# Patient Record
Sex: Male | Born: 1955 | Race: White | Hispanic: No | Marital: Married | Smoking: Never smoker
Health system: Southern US, Community
[De-identification: ages and names within clinical notes are randomized; demographics above are authoritative.]

## PROBLEM LIST (undated history)

## (undated) ENCOUNTER — Ambulatory Visit: Disposition: A | Discharge status: Home / Self Care | Payer: BC Managed Care – PPO

## (undated) DIAGNOSIS — R519 Headache, unspecified: Secondary | ICD-10-CM

## (undated) DIAGNOSIS — H532 Diplopia: Secondary | ICD-10-CM

## (undated) DIAGNOSIS — E119 Type 2 diabetes mellitus without complications: Secondary | ICD-10-CM

## (undated) HISTORY — DX: Diplopia: H53.2

## (undated) HISTORY — DX: Headache, unspecified: R51.9

## (undated) HISTORY — PX: BUNIONECTOMY: SHX129

## (undated) HISTORY — DX: Type 2 diabetes mellitus without complications: E11.9

---

## 2012-10-17 ENCOUNTER — Encounter: Payer: Self-pay | Admitting: Orthopedic Surgery

## 2012-10-17 ENCOUNTER — Ambulatory Visit (INDEPENDENT_AMBULATORY_CARE_PROVIDER_SITE_OTHER): Payer: BC Managed Care – PPO | Admitting: Orthopedic Surgery

## 2012-10-17 ENCOUNTER — Other Ambulatory Visit: Payer: Self-pay | Admitting: *Deleted

## 2012-10-17 ENCOUNTER — Ambulatory Visit (INDEPENDENT_AMBULATORY_CARE_PROVIDER_SITE_OTHER): Payer: BC Managed Care – PPO

## 2012-10-17 DIAGNOSIS — M79604 Pain in right leg: Secondary | ICD-10-CM

## 2012-10-17 DIAGNOSIS — S93491A Sprain of other ligament of right ankle, initial encounter: Secondary | ICD-10-CM

## 2012-10-17 DIAGNOSIS — M79609 Pain in unspecified limb: Secondary | ICD-10-CM

## 2012-10-17 DIAGNOSIS — M93279 Osteochondritis dissecans, unspecified ankle and joints of foot: Secondary | ICD-10-CM | POA: Insufficient documentation

## 2012-10-17 DIAGNOSIS — S93439A Sprain of tibiofibular ligament of unspecified ankle, initial encounter: Secondary | ICD-10-CM

## 2012-10-17 DIAGNOSIS — M9320 Osteochondritis dissecans of unspecified site: Secondary | ICD-10-CM

## 2012-10-17 DIAGNOSIS — M79606 Pain in leg, unspecified: Secondary | ICD-10-CM | POA: Insufficient documentation

## 2012-10-17 MED ORDER — HYDROCODONE-ACETAMINOPHEN 7.5-325 MG PO TABS: 1.0000 | ORAL_TABLET | ORAL | Status: DC | PRN

## 2012-10-17 MED ORDER — IBUPROFEN 800 MG PO TABS: 800.0000 mg | ORAL_TABLET | Freq: Three times a day (TID) | ORAL | Status: DC | PRN

## 2012-10-17 NOTE — Patient Instructions (Addendum)
MRI  Right ankle   Please call the hospital to START PHYSICAL THERAPY (choice)  Weight bearing - no weight bearing until mri results obtained

## 2012-10-17 NOTE — Progress Notes (Signed)
Patient ID: Javier Graham, male   DOB: 02/28/1956, 57 y.o.   MRN: [id number] Chief Complaint  Patient presents with  . Ankle Pain    Right ankle sprain Grade II d/t injury 10/04/12. Referred by Dr. 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 He was able to walk ambulate perform all his activities of daily living without any discomfort  General appearance is normal, the patient is alert and oriented x3 with normal mood and affect. BP 150/88  Ht 6\' 1"  (1.854 m)  Wt 230 lb (104.327 kg)  BMI 30.35 kg/m2 Ambulation is with a set of  Crutches and Cam Walker and has minimal weightbearing at this point he hasn't been able to weight-bear after the injury Right ankle is swollen he is tender over the right fibula and also in the syndesmosis ligaments proximally halfway up the ankle. He has limited range of motion. His ankle has 1+ drawer with a firm endpoint and a sulcus sign laterally. Muscle tone is normal strength cannot be assessed skin is ecchymotic pulses normal temperature is normal swelling is noted sensation is intact and normal  Upper  extremity exam  The right and left upper extremity:   Inspection revealed no abnormalities in the upper extremities.   Range of motion is full without contracture.  Motor exam is normal with grade 5 strength.  The joints are fully reduced without subluxation.  There is no atrophy or tremor and muscle tone is normal.  All joints are stable.    A repeat tib-fib x-rays showed no fracture the fibula  Plain films of the ankle were normal except for some scattered bone fragments  CT scan as described above  Recommend MRI right ankle to determine the stage of the osteochondral lesion of the talar dome and therefore determine treatment surgical versus nonoperative  Continue nonweightbearing take pain medication as ordered start ankle range of motion exercises with physical therapy followup after MRI

## 2012-10-22 ENCOUNTER — Other Ambulatory Visit: Payer: Self-pay | Admitting: Radiology

## 2012-10-23 ENCOUNTER — Ambulatory Visit
Admission: RE | Admit: 2012-10-23 | Discharge: 2012-10-23 | Disposition: A | Discharge status: Home / Self Care | Payer: BC Managed Care – PPO | Source: Ambulatory Visit | Attending: Orthopedic Surgery | Admitting: Orthopedic Surgery

## 2012-10-23 DIAGNOSIS — M79604 Pain in right leg: Secondary | ICD-10-CM

## 2012-10-24 ENCOUNTER — Telehealth: Payer: Self-pay | Admitting: Orthopedic Surgery

## 2012-10-24 NOTE — Telephone Encounter (Signed)
Patient called regarding the MRI of ankle, which was done yesterday, 10/23/12, at [facility].  He stated he was to call back for appointment, which I scheduled, then saw that he is already scheduled for MRI review appointment Monday, 10/29/12.  His concern is whether the MRI indicates "old injury" or "new injury" which he said was a determining factor for surgery.  Please advise. Patient's cell ph# is [phone],

## 2012-10-27 ENCOUNTER — Encounter: Payer: Self-pay | Admitting: Orthopedic Surgery

## 2012-10-27 NOTE — Progress Notes (Unsigned)
Patient ID: Javier Graham, male   DOB: 05-24-1955, 57 y.o.   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

## 2012-10-29 ENCOUNTER — Ambulatory Visit: Payer: BC Managed Care – PPO | Admitting: Orthopedic Surgery

## 2012-10-30 ENCOUNTER — Ambulatory Visit: Payer: BC Managed Care – PPO | Admitting: Orthopedic Surgery

## 2012-11-08 ENCOUNTER — Telehealth: Payer: Self-pay | Admitting: Orthopedic Surgery

## 2012-11-08 NOTE — Telephone Encounter (Signed)
Yes come in here for aso  No appointmnet slot needed ok to double book if needed

## 2012-11-08 NOTE — Telephone Encounter (Signed)
Wellington [place] asking if he can get a brace for his ankle. His # [phone]

## 2012-11-08 NOTE — Telephone Encounter (Signed)
Spoke with patient he is coming in 2:30 11/13/12

## 2012-11-13 ENCOUNTER — Telehealth: Payer: Self-pay | Admitting: *Deleted

## 2012-11-13 ENCOUNTER — Ambulatory Visit (INDEPENDENT_AMBULATORY_CARE_PROVIDER_SITE_OTHER): Payer: BC Managed Care – PPO | Admitting: Orthopedic Surgery

## 2012-11-13 DIAGNOSIS — Z5189 Encounter for other specified aftercare: Secondary | ICD-10-CM

## 2012-11-13 DIAGNOSIS — S93491D Sprain of other ligament of right ankle, subsequent encounter: Secondary | ICD-10-CM

## 2012-11-13 NOTE — Progress Notes (Signed)
Patient ID: Javier Graham, male   DOB: June 20, 1955, 57 y.o.   MRN: [id number] The patient came in today to get a brace which was an ASO brace he was seen by the nurse.

## 2012-11-13 NOTE — Telephone Encounter (Signed)
Patient came in to be fitted for ASO brace. He was placed in a large ASO brace.

## 2012-12-11 ENCOUNTER — Ambulatory Visit (INDEPENDENT_AMBULATORY_CARE_PROVIDER_SITE_OTHER): Payer: BC Managed Care – PPO | Admitting: Orthopedic Surgery

## 2012-12-11 DIAGNOSIS — M9320 Osteochondritis dissecans of unspecified site: Secondary | ICD-10-CM

## 2012-12-11 NOTE — Progress Notes (Signed)
Patient ID: Javier Graham, male   DOB: 1956-04-21, 57 y.o.   MRN: [id number] Chief Complaint  Patient presents with  . Follow-up    right ankle follow up following PT    BP 132/86  Ht 6\' 1"  (1.854 m)  Wt 230 lb (104.327 kg)  BMI 30.35 kg/m2  Status post right ankle injury. History of previous problems with his ankle. MRI and CT scan used to evaluate osteochondral lesion. Patient did well with therapy. No complaints. Review of systems negative. Vital signs as above. Exam shows some swelling which [place] ambulation normal no tenderness decreased range of motion is noted especially with plantarflexion skin is intact he has a little sunburn good pulse General appearance is normal, the patient is alert and oriented x3 with normal mood and affect.   Osteochondral lesion Right ankle pain Right ankle sprain  Activities as tolerated followup as needed

## 2016-06-17 ENCOUNTER — Encounter: Payer: Self-pay | Admitting: Endocrinology

## 2016-06-17 ENCOUNTER — Ambulatory Visit (INDEPENDENT_AMBULATORY_CARE_PROVIDER_SITE_OTHER): Payer: BLUE CROSS/BLUE SHIELD | Admitting: Endocrinology

## 2016-06-17 DIAGNOSIS — E1142 Type 2 diabetes mellitus with diabetic polyneuropathy: Secondary | ICD-10-CM | POA: Diagnosis not present

## 2016-06-17 MED ORDER — LIRAGLUTIDE 18 MG/3ML ~~LOC~~ SOPN: 1.8000 mg | PEN_INJECTOR | Freq: Every day | SUBCUTANEOUS | 3 refills | Status: DC

## 2016-06-17 MED ORDER — GLIMEPIRIDE 4 MG PO TABS: 4.0000 mg | ORAL_TABLET | Freq: Every day | ORAL | 3 refills | Status: DC

## 2016-06-17 NOTE — Patient Instructions (Addendum)
good diet and exercise significantly improve the control of your diabetes.  please let me know if you wish to be referred to a dietician.  high blood sugar is very risky to your health.  you should see an eye doctor and dentist every year.  It is very important to get all recommended vaccinations.  Controlling your blood pressure and cholesterol drastically reduces the damage diabetes does to your body.  Those who smoke should quit.  Please discuss these with your doctor.  check your blood sugar once a day.  vary the time of day when you check, between before the 3 meals, and at bedtime.  also check if you have symptoms of your blood sugar being too high or too low.  please keep a record of the readings and bring it to your next appointment here (or you can bring the meter itself).  You can write it on any piece of paper.  please call us sooner if your blood sugar goes below 70, or if you have a lot of readings over 200.  For now, please: Please continue the same metformin, and: Increase the victoza to 1.8 mg daily, and: I have sent a prescription to your pharmacy, to add glimepiride, and:  Please call us next week, to tell us how the blood sugar is doing.  If necessary, we can add "farxiga."   There is a good chance you would need insulin, which we can do if necessary.  You can take this once a day if you want, but 3 times a day (just before each meal) is better.   Please come back for a follow-up appointment in 3 months.

## 2016-06-17 NOTE — Progress Notes (Signed)
Subjective:    Patient ID: Javier Graham, male    DOB: Sep 28, 1955, 61 y.o.   MRN: [id number]  HPI pt is referred by Roma Kayser, NP, for diabetes.  Pt states DM was dx'ed in 2002; he has mild neuropathy of the lower extremities; he is unaware of any associated chronic complications; he has never been on insulin; pt says his diet and exercise are fair; he has never had pancreatitis, severe hypoglycemia or DKA.   Past Medical History:  Diagnosis Date  . Diabetes [facility])     Past Surgical History:  Procedure Laterality Date  . BUNIONECTOMY      Social History   Social History  . Marital status: Married    Spouse name: N/A  . Number of children: N/A  . Years of education: N/A   Occupational History  . Not on file.   Social History Main Topics  . Smoking status: Never Smoker  . Smokeless tobacco: Never Used  . Alcohol use No  . Drug use: No  . Sexual activity: Not on file   Other Topics Concern  . Not on file   Social History Narrative  . No narrative on file    Current Outpatient Prescriptions on File Prior to Visit  Medication Sig Dispense Refill  . metFORMIN (GLUMETZA) 500 MG (MOD) 24 hr tablet Take 500 mg by mouth. Take 2 tabs twice daily     No current facility-administered medications on file prior to visit.     No Known Allergies  Family History  Problem Relation Age of Onset  . Heart disease    . Diabetes    . Diabetes Father   . Diabetes Sister   . Diabetes Brother     BP 140/84   Pulse 98   Ht 6\' 1"  (1.854 m)   Wt 228 lb (103.4 kg)   SpO2 97%   BMI 30.08 kg/m    Review of Systems denies blurry vision, headache, chest pain, sob, n/v, urinary frequency, muscle cramps, excessive diaphoresis, depression, cold intolerance, rhinorrhea, and easy bruising.  He has gained weight.     Objective:   Physical Exam VS: see vs page GEN: no distress HEAD: head: no deformity eyes: no periorbital swelling, no proptosis external nose and ears are  normal mouth: no lesion seen NECK: supple, thyroid is not enlarged.   CHEST WALL: no deformity LUNGS: clear to auscultation CV: reg rate and rhythm, no murmur ABD: abdomen is soft, nontender.  no hepatosplenomegaly.  not distended.  no hernia MUSCULOSKELETAL: muscle bulk and strength are grossly normal.  no obvious joint swelling.  gait is normal and steady EXTEMITIES: no deformity.  no ulcer on the feet.  feet are of normal color and temp.  no edema PULSES: dorsalis pedis intact bilat.  no carotid bruit NEURO:  cn 2-12 grossly intact.   readily moves all 4's.  sensation is intact to touch on the feet SKIN:  Normal texture and temperature.  No rash or suspicious lesion is visible.   NODES:  None palpable at the neck PSYCH: alert, well-oriented.  Does not appear anxious nor depressed.    outside test results are reviewed:  A1c=9.8%  I have reviewed outside records, and summarized: Pt was noted to have elevated a1c, and referred here.  DM was noted to be poorly-controlled, but stable     Assessment & Plan:  Type 2 DM with polyneuropathy: he needs increased rx HTN: farxiga will help slightly.    Patient is  advised the following: Patient Instructions  good diet and exercise significantly improve the control of your diabetes.  please let me know if you wish to be referred to a dietician.  high blood sugar is very risky to your health.  you should see an eye doctor and dentist every year.  It is very important to get all recommended vaccinations.  Controlling your blood pressure and cholesterol drastically reduces the damage diabetes does to your body.  Those who smoke should quit.  Please discuss these with your doctor.  check your blood sugar once a day.  vary the time of day when you check, between before the 3 meals, and at bedtime.  also check if you have symptoms of your blood sugar being too high or too low.  please keep a record of the readings and bring it to your next appointment  here (or you can bring the meter itself).  You can write it on any piece of paper.  please call us sooner if your blood sugar goes below 70, or if you have a lot of readings over 200.  For now, please: Please continue the same metformin, and: Increase the victoza to 1.8 mg daily, and: I have sent a prescription to your pharmacy, to add glimepiride, and:  Please call us next week, to tell us how the blood sugar is doing.  If necessary, we can add "farxiga."   There is a good chance you would need insulin, which we can do if necessary.  You can take this once a day if you want, but 3 times a day (just before each meal) is better.   Please come back for a follow-up appointment in 3 months.

## 2016-06-19 ENCOUNTER — Encounter: Payer: Self-pay | Admitting: Endocrinology

## 2016-06-19 DIAGNOSIS — E119 Type 2 diabetes mellitus without complications: Secondary | ICD-10-CM | POA: Insufficient documentation

## 2016-06-20 ENCOUNTER — Other Ambulatory Visit: Payer: Self-pay | Admitting: Endocrinology

## 2016-06-20 MED ORDER — DAPAGLIFLOZIN PROPANEDIOL 5 MG PO TABS: 5.0000 mg | ORAL_TABLET | Freq: Every day | ORAL | 11 refills | Status: DC

## 2016-07-08 ENCOUNTER — Ambulatory Visit: Payer: Self-pay | Admitting: "Endocrinology

## 2016-08-26 ENCOUNTER — Encounter: Payer: Self-pay | Admitting: Endocrinology

## 2016-09-14 ENCOUNTER — Ambulatory Visit: Payer: BLUE CROSS/BLUE SHIELD | Admitting: Endocrinology

## 2017-01-16 ENCOUNTER — Other Ambulatory Visit: Payer: Self-pay | Admitting: Endocrinology

## 2017-01-16 NOTE — Telephone Encounter (Signed)
Please refill x 1 Ov is due  

## 2018-11-05 ENCOUNTER — Ambulatory Visit (HOSPITAL_COMMUNITY)
Admission: RE | Admit: 2018-11-05 | Discharge: 2018-11-05 | Disposition: A | Discharge status: Home / Self Care | Payer: BC Managed Care – PPO | Source: Ambulatory Visit | Attending: Physician Assistant | Admitting: Physician Assistant

## 2018-11-05 ENCOUNTER — Other Ambulatory Visit (HOSPITAL_COMMUNITY): Payer: Self-pay | Admitting: Physician Assistant

## 2018-11-05 ENCOUNTER — Other Ambulatory Visit: Payer: Self-pay

## 2018-11-05 DIAGNOSIS — I10 Essential (primary) hypertension: Secondary | ICD-10-CM | POA: Diagnosis present

## 2018-11-05 DIAGNOSIS — I639 Cerebral infarction, unspecified: Secondary | ICD-10-CM

## 2018-11-05 DIAGNOSIS — Z01818 Encounter for other preprocedural examination: Secondary | ICD-10-CM

## 2018-11-06 ENCOUNTER — Ambulatory Visit (HOSPITAL_BASED_OUTPATIENT_CLINIC_OR_DEPARTMENT_OTHER)
Admission: RE | Admit: 2018-11-06 | Discharge: 2018-11-06 | Disposition: A | Discharge status: Home / Self Care | Payer: BC Managed Care – PPO | Source: Ambulatory Visit | Attending: Physician Assistant | Admitting: Physician Assistant

## 2018-11-06 ENCOUNTER — Ambulatory Visit (HOSPITAL_COMMUNITY)
Admission: RE | Admit: 2018-11-06 | Discharge: 2018-11-06 | Disposition: A | Discharge status: Home / Self Care | Payer: BC Managed Care – PPO | Source: Ambulatory Visit | Attending: Physician Assistant | Admitting: Physician Assistant

## 2018-11-06 DIAGNOSIS — H532 Diplopia: Secondary | ICD-10-CM | POA: Diagnosis not present

## 2018-11-06 DIAGNOSIS — I639 Cerebral infarction, unspecified: Secondary | ICD-10-CM | POA: Insufficient documentation

## 2018-11-06 DIAGNOSIS — R51 Headache: Secondary | ICD-10-CM | POA: Diagnosis not present

## 2018-11-06 DIAGNOSIS — Z01818 Encounter for other preprocedural examination: Secondary | ICD-10-CM

## 2018-11-06 NOTE — Progress Notes (Signed)
Echocardiogram 2D Echocardiogram has been performed.  Javier Graham 11/06/2018, 3:53 PM

## 2018-12-19 ENCOUNTER — Other Ambulatory Visit: Payer: Self-pay

## 2018-12-19 ENCOUNTER — Encounter: Payer: Self-pay | Admitting: Neurology

## 2018-12-19 ENCOUNTER — Ambulatory Visit: Payer: BC Managed Care – PPO | Admitting: Neurology

## 2018-12-19 VITALS — BP 132/83 | HR 86 | Temp 97.7°F | Ht 73.0 in | Wt 231.5 lb

## 2018-12-19 DIAGNOSIS — H532 Diplopia: Secondary | ICD-10-CM | POA: Diagnosis not present

## 2018-12-19 DIAGNOSIS — I6501 Occlusion and stenosis of right vertebral artery: Secondary | ICD-10-CM

## 2018-12-19 MED ORDER — CLOPIDOGREL BISULFATE 75 MG PO TABS: 75.0000 mg | ORAL_TABLET | Freq: Every day | ORAL | 11 refills | Status: DC

## 2018-12-19 NOTE — Progress Notes (Addendum)
PATIENT: Javier KatayamaGary L Graham DOB: 1955-11-19  Chief Complaint  Patient presents with  . Headache/Diplopia    He is here with his wife, Javier Graham.  He has head pains daily that vary in severity.  The pain may only last 30 seconds to 5 minutes and at worst, he rates the pain at 3.  His constant double vision is more of an issue.    Marland Kitchen. Neurosurgery    Shirlean KellyNudelman, Robert, MD (referring provider)  . PCP    Burdine, Ananias PilgrimSteven E, MD     HISTORICAL  Javier Graham is a 63 year old male, seen in request by neurosurgeon Dr. Shirlean Kellyobert Nudelman for evaluation of headaches, persistent diplopia, is accompanied by his wife at today's clinic visit on December 19, 2018.  I have reviewed and summarized the referring note from the referring physician.  He has past medical history of hypertension, diabetes,  On October 28, 2018, he woke up noticed double vision, which has been persistent since then, most noticeable when he look to the right/inferior visual field, barely noticeable if he look to the left side, over the past couple months, his double vision has slight improvement, he described as persistent vertical double vision,  He denies fatigable ptosis, no dysarthria, no chewing difficulty, no limb muscle weakness, no shortness of breath  Ultrasound of carotid artery on November 06, 2018, less than 39% stenosis of bilateral internal carotid artery, left vertebral artery demonstrate antegrade flow, right vertebral artery demonstrate high resistant flow  Echocardiogram ejection fraction 60 to 65%, normal cavity, impaired relaxation, no evidence of left ventricular regional wall motion abnormalities.  REVIEW OF SYSTEMS: Full 14 system review of systems performed and notable only for as above All other review of systems were negative.  ALLERGIES: No Known Allergies  HOME MEDICATIONS: Current Outpatient Medications  Medication Sig Dispense Refill  . aspirin EC 81 MG tablet Take 81 mg by mouth daily.    . dapagliflozin  propanediol (FARXIGA) 5 MG TABS tablet Take 5 mg by mouth daily. 30 tablet 11  . glimepiride (AMARYL) 4 MG tablet Take 1 tablet (4 mg total) by mouth daily before breakfast. 30 tablet 3  . lisinopril (ZESTRIL) 5 MG tablet Take 5 mg by mouth daily.    . Melatonin 5 MG TABS Take 1 tablet by mouth as needed.    . metFORMIN (GLUMETZA) 500 MG (MOD) 24 hr tablet Take 500 mg by mouth. Take 2 tabs twice daily    . VICTOZA 18 MG/3ML SOPN INJECT 0.3 ML'S (1.8 MG TOTAL) INTO THE SKIN DAILY 9 pen 3   No current facility-administered medications for this visit.     PAST MEDICAL HISTORY: Past Medical History:  Diagnosis Date  . Diabetes (HCC)   . Diplopia   . Headache     PAST SURGICAL HISTORY: Past Surgical History:  Procedure Laterality Date  . BUNIONECTOMY      FAMILY HISTORY: Family History  Problem Relation Age of Onset  . Heart disease Other   . Diabetes Other   . Diabetes Father   . Stroke Father        x 2  . Heart disease Father   . Diabetes Sister   . Diabetes Brother   . Heart disease Mother     SOCIAL HISTORY: Social History   Socioeconomic History  . Marital status: Married    Spouse name: Not on file  . Number of children: 3  . Years of education: college  . Highest education level:  Master's degree (e.g., MA, MS, MEng, MEd, MSW, MBA)  Occupational History  . Not on file  Social Needs  . Financial resource strain: Not on file  . Food insecurity    Worry: Not on file    Inability: Not on file  . Transportation needs    Medical: Not on file    Non-medical: Not on file  Tobacco Use  . Smoking status: Never Smoker  . Smokeless tobacco: Never Used  Substance and Sexual Activity  . Alcohol use: No    Comment: none since 1996  . Drug use: No  . Sexual activity: Not on file  Lifestyle  . Physical activity    Days per week: Not on file    Minutes per session: Not on file  . Stress: Not on file  Relationships  . Social Musicianconnections    Talks on phone: Not on  file    Gets together: Not on file    Attends religious service: Not on file    Active member of club or organization: Not on file    Attends meetings of clubs or organizations: Not on file    Relationship status: Not on file  . Intimate partner violence    Fear of current or ex partner: Not on file    Emotionally abused: Not on file    Physically abused: Not on file    Forced sexual activity: Not on file  Other Topics Concern  . Not on file  Social History Narrative   Lives at home with his wife.   Right-handed.   One can of Coke Zero per day.     PHYSICAL EXAM   Vitals:   12/19/18 0746  BP: 132/83  Pulse: 86  Temp: 97.7 F (36.5 C)  Weight: 231 lb 8 oz (105 kg)  Height: 6\' 1"  (1.854 m)    Not recorded      Body mass index is 30.54 kg/m.  PHYSICAL EXAMNIATION:  Gen: NAD, conversant, well nourised, obese, well groomed                     Cardiovascular: Regular rate rhythm, no peripheral edema, warm, nontender. Eyes: Conjunctivae clear without exudates or hemorrhage Neck: Supple, no carotid bruits. Pulmonary: Clear to auscultation bilaterally   NEUROLOGICAL EXAM:  MENTAL STATUS: Speech:    Speech is normal; fluent and spontaneous with normal comprehension.  Cognition:     Orientation to time, place and person     Normal recent and remote memory     Normal Attention span and concentration     Normal Language, naming, repeating,spontaneous speech     Fund of knowledge   CRANIAL NERVES: CN II: Visual fields are full to confrontation.  Pupils are round equal and briskly reactive to light. CN III, IV, VI: No ptosis.  Red lens testing suggestive of left superior oblique muscle weakness, CN V: Facial sensation is intact to pinprick in all 3 divisions bilaterally. Corneal responses are intact.  CN VII: Face is symmetric with normal eye closure and smile. CN VIII: Hearing is normal to rubbing fingers CN IX, X: Palate elevates symmetrically. Phonation is normal.  CN XI: Head turning and shoulder shrug are intact CN XII: Tongue is midline with normal movements and no atrophy.  MOTOR: There is no pronator drift of out-stretched arms. Muscle bulk and tone are normal. Muscle strength is normal.  REFLEXES: Reflexes are 2+ and symmetric at the biceps, triceps, knees, and ankles. Plantar responses are  flexor.  SENSORY: Intact to light touch, pinprick, positional sensation and vibratory sensation are intact in fingers and toes.  COORDINATION: Rapid alternating movements and fine finger movements are intact. There is no dysmetria on finger-to-nose and heel-knee-shin.    GAIT/STANCE: Posture is normal. Gait is steady with normal steps, base, arm swing, and turning. Heel and toe walking are normal. Tandem gait is normal.  Romberg is absent.   DIAGNOSTIC DATA (LABS, IMAGING, TESTING) - I reviewed patient records, labs, notes, testing and imaging myself where available.   ASSESSMENT AND PLAN  Javier Graham is a 63 y.o. 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 Jolyn Nap  Continue aspirin 81 mg daily  Increase water intake   Marcial Pacas, M.D. Ph.D.  [facility] [address] [place], [place] [address] Ph: [phone] Fax: [phone]  CC: Curlene Labrum, MD  Was seen by Dr. Jolyn Nap, confirm left trochlear palsy.

## 2018-12-20 ENCOUNTER — Telehealth: Payer: Self-pay | Admitting: Neurology

## 2018-12-20 NOTE — Telephone Encounter (Signed)
[employer] Auth: [id number] (exp. 12/20/18 to 06/17/19) order sent to GI. They will reach out to the patient to schedule.

## 2018-12-21 LAB — C-REACTIVE PROTEIN: CRP: <1 mg/L (ref 0–10)

## 2018-12-21 LAB — COMPREHENSIVE METABOLIC PANEL
ALT: 27 IU/L (ref 0–44)
AST: 22 IU/L (ref 0–40)
Albumin/Globulin Ratio: 2.1 (ref 1.2–2.2)
Albumin: 4.6 g/dL (ref 3.8–4.8)
Alkaline Phosphatase: 87 IU/L (ref 39–117)
BUN/Creatinine Ratio: 14 (ref 10–24)
BUN: 14 mg/dL (ref 8–27)
Bilirubin Total: 0.4 mg/dL (ref 0.0–1.2)
CO2: 23 mmol/L (ref 20–29)
Calcium: 9.9 mg/dL (ref 8.6–10.2)
Chloride: 101 mmol/L (ref 96–106)
Creatinine, Ser: 1.03 mg/dL (ref 0.76–1.27)
GFR calc Af Amer: 90 mL/min/{1.73_m2} (ref >59)
GFR calc non Af Amer: 77 mL/min/{1.73_m2} (ref >59)
Globulin, Total: 2.2 g/dL (ref 1.5–4.5)
Glucose: 208 mg/dL — ABNORMAL HIGH (ref 65–99)
Potassium: 5 mmol/L (ref 3.5–5.2)
Sodium: 139 mmol/L (ref 134–144)
Total Protein: 6.8 g/dL (ref 6.0–8.5)

## 2018-12-21 LAB — CBC WITH DIFFERENTIAL/PLATELET
Basophils Absolute: 0 10*3/uL (ref 0.0–0.2)
Basos: 1 %
EOS (ABSOLUTE): 0.3 10*3/uL (ref 0.0–0.4)
Eos: 4 %
Hematocrit: 44 % (ref 37.5–51.0)
Hemoglobin: 15.2 g/dL (ref 13.0–17.7)
Immature Grans (Abs): 0 10*3/uL (ref 0.0–0.1)
Immature Granulocytes: 0 %
Lymphocytes Absolute: 1.6 10*3/uL (ref 0.7–3.1)
Lymphs: 24 %
MCH: 29.2 pg (ref 26.6–33.0)
MCHC: 34.5 g/dL (ref 31.5–35.7)
MCV: 85 fL (ref 79–97)
Monocytes Absolute: 0.5 10*3/uL (ref 0.1–0.9)
Monocytes: 7 %
Neutrophils Absolute: 4.3 10*3/uL (ref 1.4–7.0)
Neutrophils: 64 %
Platelets: 240 10*3/uL (ref 150–450)
RBC: 5.2 x10E6/uL (ref 4.14–5.80)
RDW: 13.7 % (ref 11.6–15.4)
WBC: 6.6 10*3/uL (ref 3.4–10.8)

## 2018-12-21 LAB — MYASTHENIA GRAVIS PANEL 2
AChR Binding Ab, Serum: <0.03 nmol/L (ref 0.00–0.24)
Anti-striation Abs: NEGATIVE

## 2018-12-21 LAB — SEDIMENTATION RATE: Sed Rate: 3 mm/hr (ref 0–30)

## 2018-12-21 LAB — HEMOGLOBIN A1C
Est. average glucose Bld gHb Est-mCnc: 154 mg/dL
Hgb A1c MFr Bld: 7 % — ABNORMAL HIGH (ref 4.8–5.6)

## 2018-12-21 LAB — VITAMIN B12: Vitamin B-12: 461 pg/mL (ref 232–1245)

## 2018-12-21 LAB — TSH: TSH: 1.47 u[IU]/mL (ref 0.450–4.500)

## 2018-12-21 LAB — ANA W/REFLEX IF POSITIVE: Anti Nuclear Antibody (ANA): NEGATIVE

## 2018-12-21 LAB — RPR: RPR Ser Ql: NONREACTIVE

## 2018-12-27 ENCOUNTER — Encounter: Payer: Self-pay | Admitting: *Deleted

## 2019-01-02 ENCOUNTER — Other Ambulatory Visit: Payer: BC Managed Care – PPO

## 2019-01-17 ENCOUNTER — Ambulatory Visit
Admission: RE | Admit: 2019-01-17 | Discharge: 2019-01-17 | Disposition: A | Discharge status: Home / Self Care | Payer: BC Managed Care – PPO | Source: Ambulatory Visit | Attending: Neurology | Admitting: Neurology

## 2019-01-17 DIAGNOSIS — I6501 Occlusion and stenosis of right vertebral artery: Secondary | ICD-10-CM

## 2019-01-17 DIAGNOSIS — H532 Diplopia: Secondary | ICD-10-CM

## 2019-01-17 MED ORDER — IOPAMIDOL (ISOVUE-370) INJECTION 76%
75.0000 mL | Freq: Once | INTRAVENOUS | Status: AC | PRN
  Administered 2019-01-17: 75 mL via INTRAVENOUS

## 2019-01-21 ENCOUNTER — Telehealth: Payer: Self-pay | Admitting: Neurology

## 2019-01-21 NOTE — Telephone Encounter (Signed)
Please call patient CTA of head and neck showed no large vessel disease, congenital smaller right vertebral artery.   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 No intracranial arterial occlusion or high-grade stenosis. 2. Diffusely diminutive right vertebral artery, likely congenital 3. Unchanged anterior cranial fossa meningioma measuring 1.8 x 1.4 cm.

## 2019-01-21 NOTE — Telephone Encounter (Signed)
I spoke to the patient and he verbalized understanding of the CTA results below.

## 2019-04-22 ENCOUNTER — Ambulatory Visit: Payer: BC Managed Care – PPO | Admitting: Neurology

## 2019-04-30 NOTE — Progress Notes (Signed)
PATIENT: Javier Graham DOB: 1955-06-01  REASON FOR VISIT: follow up HISTORY FROM: patient  HISTORY OF PRESENT ILLNESS: Today 05/01/19  HISTORY  Javier Graham is a 63 year old male, seen in request by neurosurgeon Dr. Shirlean Kelly for evaluation of headaches, persistent diplopia, is accompanied by his wife at today's clinic visit on December 19, 2018.  I have reviewed and summarized the referring note from the referring physician.  He has past medical history of hypertension, diabetes,  On October 28, 2018, he woke up noticed double vision, which has been persistent since then, most noticeable when he look to the right/inferior visual field, barely noticeable if he look to the left side, over the past couple months, his double vision has slight improvement, he described as persistent vertical double vision,  He denies fatigable ptosis, no dysarthria, no chewing difficulty, no limb muscle weakness, no shortness of breath  Ultrasound of carotid artery on November 06, 2018, less than 39% stenosis of bilateral internal carotid artery, left vertebral artery demonstrate antegrade flow, right vertebral artery demonstrate high resistant flow  Echocardiogram ejection fraction 60 to 65%, normal cavity, impaired relaxation, no evidence of left ventricular regional wall motion abnormalities.  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  He was seen by neuro-ophthalmologist, Dr. Gentry Roch, confirmed microvascular left trochlear palsy.  Since last seen, his symptoms have resolved, no longer has diplopia.  After last visit, he was switched from aspirin to Plavix.  He remains on Plavix without  side effect.  The problem has essentially healed itself.  He has follow-up with Dr. Newell Coral for monitoring of his meningioma.  He remains on medication for diabetes, and hypertension.  He has routine follow-up with his primary doctor.  He is overall he is doing well.  He does mention that his 32-year-old grandson, was having vision changes, diagnosed to cysts on the brain, he wondered if it was the same thing he had.   REVIEW OF SYSTEMS: Out of a complete 14 system review of symptoms, the patient complains only of the following symptoms, and all other reviewed systems are negative.  N/A  ALLERGIES: No Known Allergies  HOME MEDICATIONS: Outpatient Medications Prior to Visit  Medication Sig Dispense Refill  . clopidogrel (PLAVIX) 75 MG tablet Take 1 tablet (75 mg total) by mouth daily. 30 tablet 11  . dapagliflozin propanediol (FARXIGA) 5 MG TABS tablet Take 5 mg by mouth daily. 30 tablet 11  . glimepiride (AMARYL) 4 MG tablet Take 1 tablet (4 mg total) by mouth daily before breakfast. 30 tablet 3  . lisinopril (ZESTRIL) 5 MG tablet Take 5 mg by mouth daily.    . Melatonin 5 MG TABS Take 1 tablet by mouth as needed.    . metFORMIN (GLUMETZA) 500 MG (MOD) 24 hr tablet Take 500 mg by mouth. Take 2 tabs twice daily    . Omega-3 Fatty Acids (FISH OIL) 1200 MG CAPS Take 1 capsule by mouth 2 (two) times daily.    Marland Kitchen VICTOZA 18 MG/3ML SOPN INJECT 0.3 ML'S (1.8 MG TOTAL) INTO THE SKIN DAILY 9 pen 3  . aspirin EC 81 MG tablet Take 81 mg by mouth daily.     No facility-administered medications  prior to visit.    PAST MEDICAL HISTORY: Past Medical History:  Diagnosis Date  . Diabetes (HCC)   . Diplopia   . Headache     PAST SURGICAL HISTORY: Past Surgical History:  Procedure Laterality Date  . BUNIONECTOMY      FAMILY HISTORY: Family History  Problem Relation Age of Onset  . Heart disease Other   . Diabetes Other   . Diabetes Father   . Stroke Father        x 2  . Heart disease  Father   . Diabetes Sister   . Diabetes Brother   . Heart disease Mother     SOCIAL HISTORY: Social History   Socioeconomic History  . Marital status: Married    Spouse name: Not on file  . Number of children: 3  . Years of education: college  . Highest education level: Master's degree (e.g., MA, MS, MEng, MEd, MSW, MBA)  Occupational History  . Not on file  Tobacco Use  . Smoking status: Never Smoker  . Smokeless tobacco: Never Used  Substance and Sexual Activity  . Alcohol use: No    Comment: none since 1996  . Drug use: No  . Sexual activity: Not on file  Other Topics Concern  . Not on file  Social History Narrative   Lives at home with his wife.   Right-handed.   One can of Coke Zero per day.   Social Determinants of Health   Financial Resource Strain:   . Difficulty of Paying Living Expenses: Not on file  Food Insecurity:   . Worried About Programme researcher, broadcasting/film/videounning Out of Food in the Last Year: Not on file  . Ran Out of Food in the Last Year: Not on file  Transportation Needs:   . Lack of Transportation (Medical): Not on file  . Lack of Transportation (Non-Medical): Not on file  Physical Activity:   . Days of Exercise per Week: Not on file  . Minutes of Exercise per Session: Not on file  Stress:   . Feeling of Stress : Not on file  Social Connections:   . Frequency of Communication with Friends and Family: Not on file  . Frequency of Social Gatherings with Friends and Family: Not on file  . Attends Religious Services: Not on file  . Active Member of Clubs or Organizations: Not on file  . Attends BankerClub or Organization Meetings: Not on file  . Marital Status: Not on file  Intimate Partner Violence:   . Fear of Current or Ex-Partner: Not on file  . Emotionally Abused: Not on file  . Physically Abused: Not on file  . Sexually Abused: Not on file   PHYSICAL EXAM  Vitals:   05/01/19 0744  BP: 130/76  Pulse: 83  Temp: (!) 96.8 F (36 C)  Weight: 232 lb 9.6 oz (105.5 kg)   Height: 6\' 1"  (1.854 m)   Body mass index is 30.69 kg/m.  Generalized: Well developed, in no acute distress   Neurological examination  Mentation: Alert oriented to time, place, history taking. Follows all commands speech and language fluent Cranial nerve II-XII: Pupils were equal round reactive to light. Extraocular movements were full, visual field were full on confrontational test. Facial sensation and strength were normal. Head turning and shoulder shrug  were normal and symmetric. Motor: The motor testing reveals 5 over 5 strength of all 4 extremities. Good symmetric motor tone is noted throughout.  Sensory: Sensory testing is intact to soft touch on  all 4 extremities. No evidence of extinction is noted.  Coordination: Cerebellar testing reveals good finger-nose-finger and heel-to-shin bilaterally.  Gait and station: Gait is normal. Tandem gait is normal.  Reflexes: Deep tendon reflexes are symmetric and normal bilaterally.   DIAGNOSTIC DATA (LABS, IMAGING, TESTING) - I reviewed patient records, labs, notes, testing and imaging myself where available.  Lab Results  Component Value Date   WBC 6.6 12/19/2018   HGB 15.2 12/19/2018   HCT 44.0 12/19/2018   MCV 85 12/19/2018   PLT 240 12/19/2018      Component Value Date/Time   NA 139 12/19/2018 0858   K 5.0 12/19/2018 0858   CL 101 12/19/2018 0858   CO2 23 12/19/2018 0858   GLUCOSE 208 (H) 12/19/2018 0858   BUN 14 12/19/2018 0858   CREATININE 1.03 12/19/2018 0858   CALCIUM 9.9 12/19/2018 0858   PROT 6.8 12/19/2018 0858   ALBUMIN 4.6 12/19/2018 0858   AST 22 12/19/2018 0858   ALT 27 12/19/2018 0858   ALKPHOS 87 12/19/2018 0858   BILITOT 0.4 12/19/2018 0858   GFRNONAA 77 12/19/2018 0858   GFRAA 90 12/19/2018 0858   No results found for: CHOL, HDL, LDLCALC, LDLDIRECT, TRIG, CHOLHDL Lab Results  Component Value Date   HGBA1C 7.0 (H) 12/19/2018   Lab Results  Component Value Date   TDDUKGUR42 706 12/19/2018   Lab  Results  Component Value Date   TSH 1.470 12/19/2018   ASSESSMENT AND PLAN 63 y.o. year old male  has a past medical history of Diabetes ([place]), Diplopia, and Headache. here with:  1.  Acute onset vertical double vision since June 2020, diagnosed with microvascular left trochlear nerve palsy -The problem has resolved and healed itself, no longer has double vision, diagnosis was confirmed by Dr. Jolyn Nap -After last visit he was switched from aspirin to Plavix 75 mg daily, he will remain on Plavix, was filled in July for 1 year  -We discussed importance of good control of factors, hypertension, hyperlipidemia, diabetes, A1c evaluation revealed A1c 7.0 -01/17/2019 CTA head and neck showed no large vessel disease, congenital small right vertebral artery, unchanged anterior cranial fossa meningioma 1.8 x 1.4 cm, continues to follow with Dr. Sherwood Gambler (I printed him a copy of his CTA, to review with his family, as grandson is having some vision problems) -He will follow-up here on as-needed basis, offered 6 month f/u with Dr. Krista Blue, he will call if problems or concerns   I spent 15 minutes with the patient. 50% of this time was spent discussing his plan of care.   Butler Denmark, AGNP-C, DNP 05/01/2019, 8:11 AM [facility] [address] [place], [place] [address] [phone]

## 2019-05-01 ENCOUNTER — Ambulatory Visit: Payer: BC Managed Care – PPO | Admitting: Neurology

## 2019-05-01 ENCOUNTER — Other Ambulatory Visit: Payer: Self-pay

## 2019-05-01 ENCOUNTER — Encounter: Payer: Self-pay | Admitting: Neurology

## 2019-05-01 VITALS — BP 130/76 | HR 83 | Temp 96.8°F | Ht 73.0 in | Wt 232.6 lb

## 2019-05-01 DIAGNOSIS — H532 Diplopia: Secondary | ICD-10-CM

## 2019-05-01 NOTE — Patient Instructions (Signed)
I am glad you are doing better  Continue Plavix 75 mg daily  Return here as needed

## 2019-07-10 NOTE — Progress Notes (Signed)
I have reviewed and agreed above plan. 

## 2019-08-24 LAB — COLOGUARD: COLOGUARD: NEGATIVE

## 2019-08-24 LAB — EXTERNAL GENERIC LAB PROCEDURE: COLOGUARD: NEGATIVE

## 2020-11-09 IMAGING — CT CT ANGIO NECK
2 of 11 series · 4 of 33 positions shown · IV contrast (iopamidol)
Comparison: Brain MRI 12/06/2018

CLINICAL DATA: Headaches and blurry vision. History of meningioma.
Diplopia.

EXAM:
CT ANGIOGRAPHY HEAD AND NECK
TECHNIQUE: Multidetector CT imaging of the head and neck was performed using
the standard protocol during bolus administration of intravenous
contrast. Multiplanar CT image reconstructions and MIPs were
obtained to evaluate the vascular anatomy. Carotid stenosis
measurements (when applicable) are obtained utilizing NASCET
criteria, using the distal internal carotid diameter as the
denominator.
CONTRAST:  75mL [id number] IOPAMIDOL ([id number]) INJECTION 76%

[Series 12: brain 3.00 hr40 s3 sag without ibhc · sagittal · non-contrast · 0.38mm/px · 1 of 57 slices shown]
[im 29/57  soft-tissue]
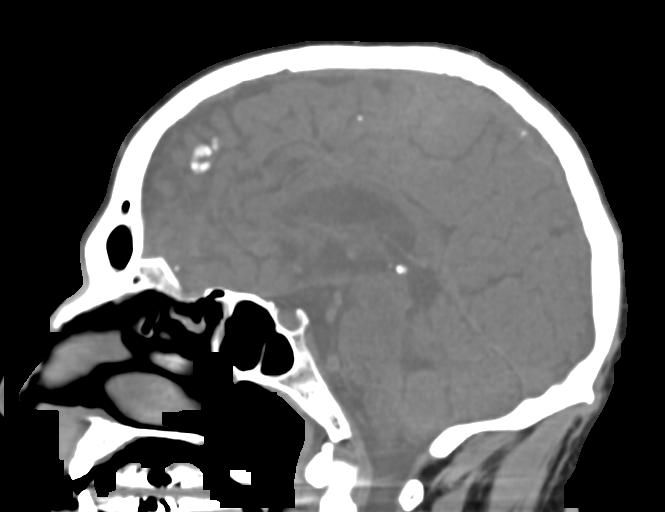

[Series 15: cta head & neck 1.00 hv48 s3 ax thin mips · axial · 0.50mm/px · z∈[-817,-425]mm · 3 of 393 slices shown]
[im 1/393  soft-tissue]
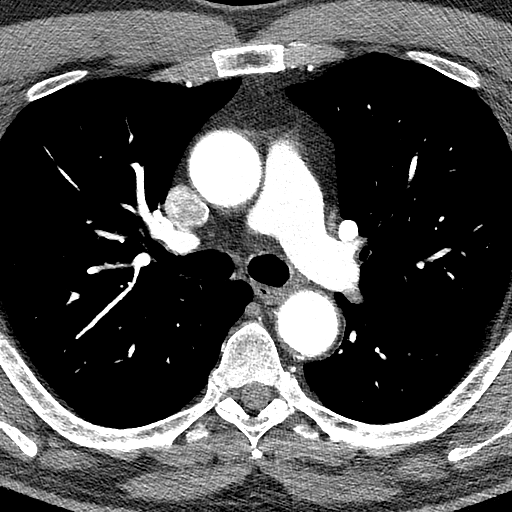
[im 197/393  bone]
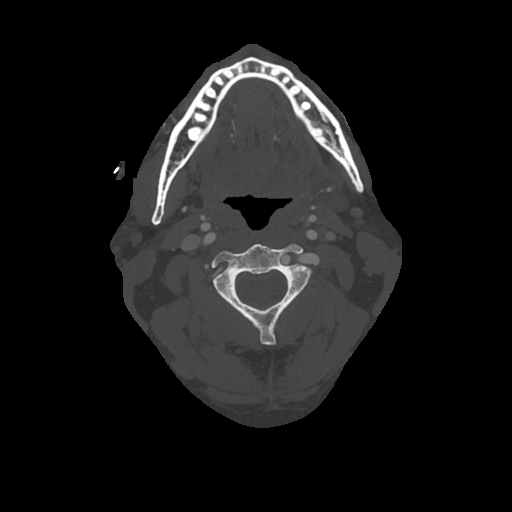
[im 393/393  soft-tissue]
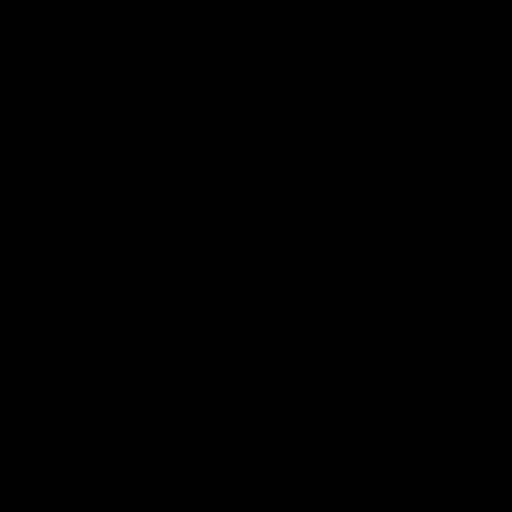

[4 of 33 positions shown; findings below may reference images not displayed]

FINDINGS: CT HEAD FINDINGS

Brain: No hemorrhage or extra-axial collection. There is an anterior
cranial fossa meningioma measuring 1.8 x 1.4 cm. The size and
configuration of the ventricles and extra-axial CSF spaces are
normal. There is no acute or chronic infarction. The brain
parenchyma is normal.

Skull: The visualized skull base, calvarium and extracranial soft
tissues are normal.

Sinuses/Orbits: No fluid levels or advanced mucosal thickening of
the visualized paranasal sinuses. No mastoid or middle ear effusion.
The orbits are normal.

CTA NECK FINDINGS

SKELETON: There is no bony spinal canal stenosis. No lytic or
blastic lesion.

OTHER NECK: Normal pharynx, larynx and major salivary glands. No
cervical lymphadenopathy. Unremarkable thyroid gland.

UPPER CHEST: No pneumothorax or pleural effusion. No nodules or
masses.

AORTIC ARCH:

There is no calcific atherosclerosis of the aortic arch. There is no
aneurysm, dissection or hemodynamically significant stenosis of the
visualized ascending aorta and aortic arch.

Conventional 3 vessel aortic branching pattern.

The visualized proximal subclavian arteries are widely patent.

RIGHT CAROTID SYSTEM:

--Common carotid artery: Widely patent origin without common carotid
artery dissection or aneurysm.

--Internal carotid artery: Normal without aneurysm, dissection or
stenosis.

--External carotid artery: No acute abnormality.

LEFT CAROTID SYSTEM:

--Common carotid artery: Widely patent origin without common carotid
artery dissection or aneurysm.

--Internal carotid artery: Normal without aneurysm, dissection or
stenosis.

--External carotid artery: No acute abnormality.

VERTEBRAL ARTERIES: Left dominant configuration. The right vertebral
artery is diminutive along its entire course.

Both origins are clearly patent.

No dissection, occlusion or flow-limiting stenosis to the skull base
(V1-V3 segments).

CTA HEAD FINDINGS

POSTERIOR CIRCULATION:

--Vertebral arteries: Short segment narrowing of the already
diminutive right V4 segment. Normal left.

--Posterior inferior cerebellar arteries (PICA): Patent origins from
the vertebral arteries.

--Anterior inferior cerebellar arteries (AICA): Patent origins from
the basilar artery.

--Basilar artery: Normal.

--Superior cerebellar arteries: Normal.

--Posterior cerebral arteries: Normal. Both originate from the
basilar artery. Posterior communicating arteries (p-comm) are
diminutive or absent.

ANTERIOR CIRCULATION:

--Intracranial internal carotid arteries: Normal.

--Anterior cerebral arteries (ACA): Normal. Both A1 segments are
present. Patent anterior communicating artery (a-comm).

--Middle cerebral arteries (MCA): Normal.

VENOUS SINUSES: As permitted by contrast timing, patent.

ANATOMIC VARIANTS: None

Review of the MIP images confirms the above findings.
IMPRESSION: 1. No intracranial arterial occlusion or high-grade stenosis.
2. Diffusely diminutive right vertebral artery, likely congenital
3. Unchanged anterior cranial fossa meningioma measuring 1.8 x
cm.

## 2022-01-24 DIAGNOSIS — E119 Type 2 diabetes mellitus without complications: Secondary | ICD-10-CM | POA: Diagnosis not present

## 2022-01-24 DIAGNOSIS — R42 Dizziness and giddiness: Secondary | ICD-10-CM | POA: Diagnosis not present

## 2022-01-24 DIAGNOSIS — I10 Essential (primary) hypertension: Secondary | ICD-10-CM | POA: Diagnosis not present

## 2022-01-24 DIAGNOSIS — E7849 Other hyperlipidemia: Secondary | ICD-10-CM | POA: Diagnosis not present

## 2022-01-25 DIAGNOSIS — Z85828 Personal history of other malignant neoplasm of skin: Secondary | ICD-10-CM | POA: Diagnosis not present

## 2022-01-25 DIAGNOSIS — L723 Sebaceous cyst: Secondary | ICD-10-CM | POA: Diagnosis not present

## 2022-01-25 DIAGNOSIS — L570 Actinic keratosis: Secondary | ICD-10-CM | POA: Diagnosis not present

## 2022-07-25 DIAGNOSIS — Z1321 Encounter for screening for nutritional disorder: Secondary | ICD-10-CM | POA: Diagnosis not present

## 2022-07-25 DIAGNOSIS — I10 Essential (primary) hypertension: Secondary | ICD-10-CM | POA: Diagnosis not present

## 2022-07-25 DIAGNOSIS — E782 Mixed hyperlipidemia: Secondary | ICD-10-CM | POA: Diagnosis not present

## 2022-07-25 DIAGNOSIS — E119 Type 2 diabetes mellitus without complications: Secondary | ICD-10-CM | POA: Diagnosis not present

## 2022-07-25 DIAGNOSIS — E7849 Other hyperlipidemia: Secondary | ICD-10-CM | POA: Diagnosis not present

## 2022-07-28 DIAGNOSIS — I10 Essential (primary) hypertension: Secondary | ICD-10-CM | POA: Diagnosis not present

## 2022-07-28 DIAGNOSIS — E119 Type 2 diabetes mellitus without complications: Secondary | ICD-10-CM | POA: Diagnosis not present

## 2022-07-28 DIAGNOSIS — R002 Palpitations: Secondary | ICD-10-CM | POA: Diagnosis not present

## 2022-07-28 DIAGNOSIS — E7849 Other hyperlipidemia: Secondary | ICD-10-CM | POA: Diagnosis not present

## 2022-07-28 DIAGNOSIS — M545 Low back pain, unspecified: Secondary | ICD-10-CM | POA: Diagnosis not present

## 2023-01-19 DIAGNOSIS — E109 Type 1 diabetes mellitus without complications: Secondary | ICD-10-CM | POA: Diagnosis not present

## 2023-01-19 DIAGNOSIS — E7849 Other hyperlipidemia: Secondary | ICD-10-CM | POA: Diagnosis not present

## 2023-01-19 DIAGNOSIS — H35363 Drusen (degenerative) of macula, bilateral: Secondary | ICD-10-CM | POA: Diagnosis not present

## 2023-01-19 DIAGNOSIS — I10 Essential (primary) hypertension: Secondary | ICD-10-CM | POA: Diagnosis not present

## 2023-01-24 DIAGNOSIS — D485 Neoplasm of uncertain behavior of skin: Secondary | ICD-10-CM | POA: Diagnosis not present

## 2023-01-24 DIAGNOSIS — L570 Actinic keratosis: Secondary | ICD-10-CM | POA: Diagnosis not present

## 2023-01-26 DIAGNOSIS — I10 Essential (primary) hypertension: Secondary | ICD-10-CM | POA: Diagnosis not present

## 2023-01-26 DIAGNOSIS — Z0001 Encounter for general adult medical examination with abnormal findings: Secondary | ICD-10-CM | POA: Diagnosis not present

## 2023-01-26 DIAGNOSIS — K429 Umbilical hernia without obstruction or gangrene: Secondary | ICD-10-CM | POA: Diagnosis not present

## 2023-01-26 DIAGNOSIS — M533 Sacrococcygeal disorders, not elsewhere classified: Secondary | ICD-10-CM | POA: Diagnosis not present

## 2023-01-26 DIAGNOSIS — M545 Low back pain, unspecified: Secondary | ICD-10-CM | POA: Diagnosis not present

## 2023-01-26 DIAGNOSIS — E1165 Type 2 diabetes mellitus with hyperglycemia: Secondary | ICD-10-CM | POA: Diagnosis not present

## 2023-01-26 DIAGNOSIS — E7849 Other hyperlipidemia: Secondary | ICD-10-CM | POA: Diagnosis not present

## 2023-01-26 DIAGNOSIS — M25511 Pain in right shoulder: Secondary | ICD-10-CM | POA: Diagnosis not present

## 2023-01-26 DIAGNOSIS — R002 Palpitations: Secondary | ICD-10-CM | POA: Diagnosis not present

## 2023-01-30 DIAGNOSIS — M7989 Other specified soft tissue disorders: Secondary | ICD-10-CM | POA: Diagnosis not present

## 2023-02-01 DIAGNOSIS — M25511 Pain in right shoulder: Secondary | ICD-10-CM | POA: Diagnosis not present

## 2023-02-01 DIAGNOSIS — M545 Low back pain, unspecified: Secondary | ICD-10-CM | POA: Diagnosis not present

## 2023-02-01 DIAGNOSIS — M160 Bilateral primary osteoarthritis of hip: Secondary | ICD-10-CM | POA: Diagnosis not present

## 2023-02-01 DIAGNOSIS — M25512 Pain in left shoulder: Secondary | ICD-10-CM | POA: Diagnosis not present

## 2023-02-03 DIAGNOSIS — Z1212 Encounter for screening for malignant neoplasm of rectum: Secondary | ICD-10-CM | POA: Diagnosis not present

## 2023-02-03 DIAGNOSIS — Z1211 Encounter for screening for malignant neoplasm of colon: Secondary | ICD-10-CM | POA: Diagnosis not present

## 2023-02-08 LAB — COLOGUARD: COLOGUARD: NEGATIVE

## 2023-02-08 LAB — EXTERNAL GENERIC LAB PROCEDURE: COLOGUARD: NEGATIVE

## 2023-02-10 DIAGNOSIS — M5459 Other low back pain: Secondary | ICD-10-CM | POA: Diagnosis not present

## 2023-02-10 DIAGNOSIS — M25511 Pain in right shoulder: Secondary | ICD-10-CM | POA: Diagnosis not present

## 2023-02-13 DIAGNOSIS — M25511 Pain in right shoulder: Secondary | ICD-10-CM | POA: Diagnosis not present

## 2023-02-13 DIAGNOSIS — M5459 Other low back pain: Secondary | ICD-10-CM | POA: Diagnosis not present

## 2023-02-21 DIAGNOSIS — M25511 Pain in right shoulder: Secondary | ICD-10-CM | POA: Diagnosis not present

## 2023-02-21 DIAGNOSIS — M5459 Other low back pain: Secondary | ICD-10-CM | POA: Diagnosis not present

## 2023-02-22 DIAGNOSIS — M25511 Pain in right shoulder: Secondary | ICD-10-CM | POA: Diagnosis not present

## 2023-02-22 DIAGNOSIS — M5459 Other low back pain: Secondary | ICD-10-CM | POA: Diagnosis not present

## 2023-02-27 DIAGNOSIS — M5459 Other low back pain: Secondary | ICD-10-CM | POA: Diagnosis not present

## 2023-02-27 DIAGNOSIS — M25511 Pain in right shoulder: Secondary | ICD-10-CM | POA: Diagnosis not present

## 2023-03-01 DIAGNOSIS — M5459 Other low back pain: Secondary | ICD-10-CM | POA: Diagnosis not present

## 2023-03-01 DIAGNOSIS — M25511 Pain in right shoulder: Secondary | ICD-10-CM | POA: Diagnosis not present

## 2023-03-08 DIAGNOSIS — M160 Bilateral primary osteoarthritis of hip: Secondary | ICD-10-CM | POA: Diagnosis not present

## 2023-03-09 DIAGNOSIS — M5459 Other low back pain: Secondary | ICD-10-CM | POA: Diagnosis not present

## 2023-03-09 DIAGNOSIS — M25511 Pain in right shoulder: Secondary | ICD-10-CM | POA: Diagnosis not present

## 2023-03-14 DIAGNOSIS — M25511 Pain in right shoulder: Secondary | ICD-10-CM | POA: Diagnosis not present

## 2023-03-14 DIAGNOSIS — M5459 Other low back pain: Secondary | ICD-10-CM | POA: Diagnosis not present

## 2023-03-21 DIAGNOSIS — M25511 Pain in right shoulder: Secondary | ICD-10-CM | POA: Diagnosis not present

## 2023-03-21 DIAGNOSIS — M5459 Other low back pain: Secondary | ICD-10-CM | POA: Diagnosis not present

## 2023-05-24 ENCOUNTER — Telehealth: Payer: Self-pay | Admitting: Neurology

## 2023-05-24 NOTE — Telephone Encounter (Signed)
 LVM at 4:05 pm; Have a appointment with Dr. Gracie Lav on March 13. I believe should get a scan done of my brain. Because she is been following up with me;checking it out to be sure that nothing is wrong and that things are the way they should be. If you could talk with her and have some one call back and let me know if we are going to do another scan.  Contacted the patient to inform him; has been 3 years since has seen Dr. Gracie Lav which makes you an unestablished patient. Dr. Gracie Lav would not be able to advise you until your consult with her. Patient verbalized understand and will  wait until see Dr. Gracie Lav.

## 2023-06-12 DIAGNOSIS — I10 Essential (primary) hypertension: Secondary | ICD-10-CM | POA: Diagnosis not present

## 2023-06-12 DIAGNOSIS — E7849 Other hyperlipidemia: Secondary | ICD-10-CM | POA: Diagnosis not present

## 2023-06-12 DIAGNOSIS — E1165 Type 2 diabetes mellitus with hyperglycemia: Secondary | ICD-10-CM | POA: Diagnosis not present

## 2023-06-14 DIAGNOSIS — Z8673 Personal history of transient ischemic attack (TIA), and cerebral infarction without residual deficits: Secondary | ICD-10-CM | POA: Diagnosis not present

## 2023-06-14 DIAGNOSIS — G936 Cerebral edema: Secondary | ICD-10-CM | POA: Diagnosis not present

## 2023-06-14 DIAGNOSIS — Z823 Family history of stroke: Secondary | ICD-10-CM | POA: Diagnosis not present

## 2023-06-14 DIAGNOSIS — G9389 Other specified disorders of brain: Secondary | ICD-10-CM | POA: Diagnosis not present

## 2023-06-14 DIAGNOSIS — I639 Cerebral infarction, unspecified: Secondary | ICD-10-CM | POA: Diagnosis not present

## 2023-06-14 DIAGNOSIS — E785 Hyperlipidemia, unspecified: Secondary | ICD-10-CM | POA: Diagnosis not present

## 2023-06-14 DIAGNOSIS — I10 Essential (primary) hypertension: Secondary | ICD-10-CM | POA: Diagnosis not present

## 2023-06-14 DIAGNOSIS — D329 Benign neoplasm of meninges, unspecified: Secondary | ICD-10-CM | POA: Diagnosis not present

## 2023-06-14 DIAGNOSIS — E1165 Type 2 diabetes mellitus with hyperglycemia: Secondary | ICD-10-CM | POA: Diagnosis not present

## 2023-06-14 DIAGNOSIS — R270 Ataxia, unspecified: Secondary | ICD-10-CM | POA: Diagnosis not present

## 2023-06-14 DIAGNOSIS — R220 Localized swelling, mass and lump, head: Secondary | ICD-10-CM | POA: Diagnosis not present

## 2023-06-15 DIAGNOSIS — D496 Neoplasm of unspecified behavior of brain: Secondary | ICD-10-CM | POA: Diagnosis not present

## 2023-06-19 ENCOUNTER — Other Ambulatory Visit: Payer: Self-pay | Admitting: Radiation Therapy

## 2023-06-22 ENCOUNTER — Inpatient Hospital Stay
Admission: RE | Admit: 2023-06-22 | Discharge: 2023-06-22 | Disposition: A | Discharge status: Home / Self Care | Payer: Self-pay | Source: Ambulatory Visit | Attending: Internal Medicine | Admitting: Internal Medicine

## 2023-06-22 ENCOUNTER — Telehealth: Payer: Self-pay | Admitting: Internal Medicine

## 2023-06-22 ENCOUNTER — Inpatient Hospital Stay: Payer: Self-pay | Admitting: Internal Medicine

## 2023-06-22 ENCOUNTER — Telehealth: Payer: Self-pay | Admitting: *Deleted

## 2023-06-22 ENCOUNTER — Inpatient Hospital Stay: Payer: Self-pay

## 2023-06-22 ENCOUNTER — Other Ambulatory Visit: Payer: Self-pay | Admitting: *Deleted

## 2023-06-22 DIAGNOSIS — G9389 Other specified disorders of brain: Secondary | ICD-10-CM

## 2023-06-22 DIAGNOSIS — D496 Neoplasm of unspecified behavior of brain: Secondary | ICD-10-CM | POA: Diagnosis not present

## 2023-06-22 NOTE — Telephone Encounter (Signed)
PC to patient regarding his missed appointment today at 2 pm, spoke with his wife, she states she canceled the appointment.  They have decided to go to [facility]

## 2023-06-22 NOTE — Telephone Encounter (Signed)
Patient states would like to cancel the referral. Receiving care through [facility].

## 2023-06-26 ENCOUNTER — Inpatient Hospital Stay: Payer: Self-pay | Attending: Internal Medicine

## 2023-06-26 DIAGNOSIS — S01112A Laceration without foreign body of left eyelid and periocular area, initial encounter: Secondary | ICD-10-CM | POA: Diagnosis not present

## 2023-06-26 DIAGNOSIS — S0512XA Contusion of eyeball and orbital tissues, left eye, initial encounter: Secondary | ICD-10-CM | POA: Diagnosis not present

## 2023-06-26 DIAGNOSIS — R220 Localized swelling, mass and lump, head: Secondary | ICD-10-CM | POA: Diagnosis not present

## 2023-06-26 DIAGNOSIS — Z7952 Long term (current) use of systemic steroids: Secondary | ICD-10-CM | POA: Diagnosis not present

## 2023-06-26 DIAGNOSIS — Z7902 Long term (current) use of antithrombotics/antiplatelets: Secondary | ICD-10-CM | POA: Diagnosis not present

## 2023-06-26 DIAGNOSIS — W06XXXA Fall from bed, initial encounter: Secondary | ICD-10-CM | POA: Diagnosis not present

## 2023-06-26 DIAGNOSIS — C719 Malignant neoplasm of brain, unspecified: Secondary | ICD-10-CM | POA: Diagnosis not present

## 2023-07-03 DIAGNOSIS — Z79899 Other long term (current) drug therapy: Secondary | ICD-10-CM | POA: Diagnosis not present

## 2023-07-03 DIAGNOSIS — Z01818 Encounter for other preprocedural examination: Secondary | ICD-10-CM | POA: Diagnosis not present

## 2023-07-03 DIAGNOSIS — E1165 Type 2 diabetes mellitus with hyperglycemia: Secondary | ICD-10-CM | POA: Diagnosis not present

## 2023-07-03 DIAGNOSIS — M545 Low back pain, unspecified: Secondary | ICD-10-CM | POA: Diagnosis not present

## 2023-07-03 DIAGNOSIS — D496 Neoplasm of unspecified behavior of brain: Secondary | ICD-10-CM | POA: Diagnosis not present

## 2023-07-03 DIAGNOSIS — G8929 Other chronic pain: Secondary | ICD-10-CM | POA: Diagnosis not present

## 2023-07-03 DIAGNOSIS — Z7952 Long term (current) use of systemic steroids: Secondary | ICD-10-CM | POA: Diagnosis not present

## 2023-07-03 DIAGNOSIS — G936 Cerebral edema: Secondary | ICD-10-CM | POA: Diagnosis not present

## 2023-07-03 DIAGNOSIS — Z7985 Long-term (current) use of injectable non-insulin antidiabetic drugs: Secondary | ICD-10-CM | POA: Diagnosis not present

## 2023-07-03 DIAGNOSIS — E785 Hyperlipidemia, unspecified: Secondary | ICD-10-CM | POA: Diagnosis not present

## 2023-07-03 DIAGNOSIS — Z9181 History of falling: Secondary | ICD-10-CM | POA: Diagnosis not present

## 2023-07-03 DIAGNOSIS — R414 Neurologic neglect syndrome: Secondary | ICD-10-CM | POA: Diagnosis not present

## 2023-07-03 DIAGNOSIS — G9389 Other specified disorders of brain: Secondary | ICD-10-CM | POA: Diagnosis not present

## 2023-07-03 DIAGNOSIS — D320 Benign neoplasm of cerebral meninges: Secondary | ICD-10-CM | POA: Diagnosis not present

## 2023-07-03 DIAGNOSIS — I10 Essential (primary) hypertension: Secondary | ICD-10-CM | POA: Diagnosis not present

## 2023-07-03 DIAGNOSIS — Z7902 Long term (current) use of antithrombotics/antiplatelets: Secondary | ICD-10-CM | POA: Diagnosis not present

## 2023-07-03 DIAGNOSIS — E119 Type 2 diabetes mellitus without complications: Secondary | ICD-10-CM | POA: Diagnosis not present

## 2023-07-03 DIAGNOSIS — Z7984 Long term (current) use of oral hypoglycemic drugs: Secondary | ICD-10-CM | POA: Diagnosis not present

## 2023-07-03 DIAGNOSIS — Z7901 Long term (current) use of anticoagulants: Secondary | ICD-10-CM | POA: Diagnosis not present

## 2023-07-03 DIAGNOSIS — T380X5A Adverse effect of glucocorticoids and synthetic analogues, initial encounter: Secondary | ICD-10-CM | POA: Diagnosis not present

## 2023-07-03 DIAGNOSIS — R739 Hyperglycemia, unspecified: Secondary | ICD-10-CM | POA: Diagnosis not present

## 2023-07-03 DIAGNOSIS — G935 Compression of brain: Secondary | ICD-10-CM | POA: Diagnosis not present

## 2023-07-03 DIAGNOSIS — C713 Malignant neoplasm of parietal lobe: Secondary | ICD-10-CM | POA: Diagnosis not present

## 2023-07-03 DIAGNOSIS — Z8673 Personal history of transient ischemic attack (TIA), and cerebral infarction without residual deficits: Secondary | ICD-10-CM | POA: Diagnosis not present

## 2023-07-03 DIAGNOSIS — Z86011 Personal history of benign neoplasm of the brain: Secondary | ICD-10-CM | POA: Diagnosis not present

## 2023-07-03 DIAGNOSIS — Z9889 Other specified postprocedural states: Secondary | ICD-10-CM | POA: Diagnosis not present

## 2023-07-03 DIAGNOSIS — C719 Malignant neoplasm of brain, unspecified: Secondary | ICD-10-CM | POA: Diagnosis not present

## 2023-07-05 DIAGNOSIS — D496 Neoplasm of unspecified behavior of brain: Secondary | ICD-10-CM | POA: Diagnosis not present

## 2023-07-07 DIAGNOSIS — E119 Type 2 diabetes mellitus without complications: Secondary | ICD-10-CM | POA: Diagnosis not present

## 2023-07-07 DIAGNOSIS — R739 Hyperglycemia, unspecified: Secondary | ICD-10-CM | POA: Diagnosis not present

## 2023-07-07 DIAGNOSIS — T380X5A Adverse effect of glucocorticoids and synthetic analogues, initial encounter: Secondary | ICD-10-CM | POA: Diagnosis not present

## 2023-07-13 DIAGNOSIS — C713 Malignant neoplasm of parietal lobe: Secondary | ICD-10-CM | POA: Diagnosis not present

## 2023-07-14 DIAGNOSIS — C713 Malignant neoplasm of parietal lobe: Secondary | ICD-10-CM | POA: Diagnosis not present

## 2023-07-14 DIAGNOSIS — Z79899 Other long term (current) drug therapy: Secondary | ICD-10-CM | POA: Diagnosis not present

## 2023-07-14 DIAGNOSIS — Z9889 Other specified postprocedural states: Secondary | ICD-10-CM | POA: Diagnosis not present

## 2023-07-17 ENCOUNTER — Other Ambulatory Visit: Payer: Self-pay | Admitting: Radiation Therapy

## 2023-07-17 DIAGNOSIS — D496 Neoplasm of unspecified behavior of brain: Secondary | ICD-10-CM

## 2023-07-18 ENCOUNTER — Telehealth: Payer: Self-pay | Admitting: *Deleted

## 2023-07-18 ENCOUNTER — Other Ambulatory Visit: Payer: Self-pay | Admitting: *Deleted

## 2023-07-18 ENCOUNTER — Inpatient Hospital Stay
Admission: RE | Admit: 2023-07-18 | Discharge: 2023-07-18 | Disposition: A | Discharge status: Home / Self Care | Payer: Self-pay | Source: Ambulatory Visit | Attending: Internal Medicine | Admitting: Internal Medicine

## 2023-07-18 DIAGNOSIS — G9389 Other specified disorders of brain: Secondary | ICD-10-CM

## 2023-07-18 NOTE — Telephone Encounter (Signed)
 Images from brain MRI at [facility] on 07/03/23 & 07/06/23 be pushed to [facility].  IT requested to link images to outside films order.

## 2023-07-20 ENCOUNTER — Institutional Professional Consult (permissible substitution): Payer: Medicare Other | Admitting: Neurology

## 2023-07-20 DIAGNOSIS — Z9889 Other specified postprocedural states: Secondary | ICD-10-CM | POA: Diagnosis not present

## 2023-07-20 DIAGNOSIS — C713 Malignant neoplasm of parietal lobe: Secondary | ICD-10-CM | POA: Diagnosis not present

## 2023-07-25 ENCOUNTER — Other Ambulatory Visit: Payer: Self-pay | Admitting: Radiation Oncology

## 2023-07-25 ENCOUNTER — Inpatient Hospital Stay
Admission: RE | Admit: 2023-07-25 | Discharge: 2023-07-25 | Disposition: A | Discharge status: Home / Self Care | Payer: Self-pay | Source: Ambulatory Visit | Attending: Radiation Oncology | Admitting: Radiation Oncology

## 2023-07-25 DIAGNOSIS — D496 Neoplasm of unspecified behavior of brain: Secondary | ICD-10-CM

## 2023-07-27 ENCOUNTER — Telehealth: Payer: Self-pay | Admitting: Pharmacist

## 2023-07-27 ENCOUNTER — Inpatient Hospital Stay: Payer: Self-pay | Attending: Internal Medicine | Admitting: Internal Medicine

## 2023-07-27 ENCOUNTER — Encounter: Payer: Self-pay | Admitting: Internal Medicine

## 2023-07-27 ENCOUNTER — Telehealth: Payer: Self-pay | Admitting: Pharmacy Technician

## 2023-07-27 ENCOUNTER — Other Ambulatory Visit (HOSPITAL_COMMUNITY): Payer: Self-pay

## 2023-07-27 VITALS — BP 168/88 | HR 104 | Temp 97.7°F | Resp 17 | Ht 73.0 in | Wt 210.0 lb

## 2023-07-27 DIAGNOSIS — Z7984 Long term (current) use of oral hypoglycemic drugs: Secondary | ICD-10-CM | POA: Insufficient documentation

## 2023-07-27 DIAGNOSIS — C719 Malignant neoplasm of brain, unspecified: Secondary | ICD-10-CM

## 2023-07-27 DIAGNOSIS — C713 Malignant neoplasm of parietal lobe: Secondary | ICD-10-CM | POA: Insufficient documentation

## 2023-07-27 DIAGNOSIS — Z79899 Other long term (current) drug therapy: Secondary | ICD-10-CM | POA: Diagnosis not present

## 2023-07-27 DIAGNOSIS — E119 Type 2 diabetes mellitus without complications: Secondary | ICD-10-CM | POA: Insufficient documentation

## 2023-07-27 MED ORDER — TEMOZOLOMIDE 140 MG PO CAPS: 140.0000 mg | ORAL_CAPSULE | Freq: Every day | ORAL | 0 refills | Status: DC

## 2023-07-27 MED ORDER — TEMOZOLOMIDE 20 MG PO CAPS
20.0000 mg | ORAL_CAPSULE | Freq: Every day | ORAL | 0 refills | Status: DC
  Filled 2023-07-28: qty 42, 42d supply, fill #0

## 2023-07-27 MED ORDER — TEMOZOLOMIDE 20 MG PO CAPS: 20.0000 mg | ORAL_CAPSULE | Freq: Every day | ORAL | 0 refills | Status: DC

## 2023-07-27 MED ORDER — ONDANSETRON HCL 8 MG PO TABS
8.0000 mg | ORAL_TABLET | Freq: Three times a day (TID) | ORAL | 1 refills | Status: DC | PRN
  Filled 2023-07-27: qty 30, 10d supply, fill #0

## 2023-07-27 MED ORDER — TEMOZOLOMIDE 140 MG PO CAPS
140.0000 mg | ORAL_CAPSULE | Freq: Every day | ORAL | 0 refills | Status: DC
  Filled 2023-07-28: qty 42, 42d supply, fill #0

## 2023-07-27 NOTE — Progress Notes (Signed)

## 2023-07-27 NOTE — Telephone Encounter (Signed)
 Oral Oncology Pharmacist Encounter  Received new prescription for Temodar (temozolomide) for the treatment of glioblastoma in conjunction with radiation, planned duration 42 days.  CBC and BMP from 07/06/23 assessed, no relevant lab abnormalities requiring baseline dose adjustment required at this time. Prescription dose and frequency assessed for appropriateness.  Current medication list in Epic reviewed, no relevant/significant DDIs with Temodar identified.  Evaluated chart and no patient barriers to medication adherence noted.   Patient agreement for treatment documented in MD note on 07/27/23.  Prescription has been e-scribed to the [facility] for benefits analysis and approval.  Oral Oncology Clinic will continue to follow for insurance authorization, copayment issues, initial counseling and start date.  Sherry Ruffing, PharmD, BCPS, BCOP Hematology/Oncology Clinical Pharmacist [facility] Oral Chemotherapy Navigation Clinics [phone] 07/27/2023 3:21 PM

## 2023-07-27 NOTE — Progress Notes (Signed)
 [facility] at [facility] [address]  [place], [place] [address] [phone]   New Patient Evaluation  Date of Service: 07/27/23 Patient Name: [name] Patient MRN: [id number] Patient DOB: [birth year] Provider: Henreitta Leber, MD  Identifying Statement:  TREAVON CASTILLEJA is a 68 y.o. male with right parietal glioblastoma who presents for initial consultation and evaluation.    Referring Provider: Juliette Alcide, MD [address] [place],  [place] [address]  Oncologic History: Oncology History  Glioblastoma, IDH-wildtype [facility])  07/05/2023 Surgery   Craniotomy, resection at [facility] with Dr. Allena Katz; path is glioblastoma     Biomarkers:  MGMT Unknown.  IDH 1/2 Wild type.  EGFR Unknown  TERT Unknown   History of Present Illness: The patient's records from the referring physician were obtained and reviewed and the patient interviewed to confirm this HPI.  Daylene Katayama presents to establish care locally for his new brain tumor.  He and his wife describe issues with driving navigation, some confusion which led to his assessment in the ED.  CNS imaging demonstrated a large enhancing mass within the right parietal lobe, c/w primary brain tumor.  He underwent surgery at [facility] on 2/26 with Dr. Allena Katz; path demonstrated glioblastoma IDHwt.  Following surgery he has noticed some issues with navigating space and finding certain words.  No longer dosing Keppra or decadron.  Medications: Current Outpatient Medications on File Prior to Visit  Medication Sig Dispense Refill   aspirin EC 81 MG tablet Take 81 mg by mouth daily.     clopidogrel (PLAVIX) 75 MG tablet Take 1 tablet (75 mg total) by mouth daily. 30 tablet 11   dapagliflozin propanediol (FARXIGA) 5 MG TABS tablet Take 5 mg by mouth daily. 30 tablet 11   glimepiride (AMARYL) 4 MG tablet Take 1 tablet (4 mg total) by mouth daily before breakfast. 30 tablet 3   lisinopril (ZESTRIL) 5 MG tablet Take 5 mg by mouth  daily.     Melatonin 5 MG TABS Take 1 tablet by mouth as needed.     metFORMIN (GLUMETZA) 500 MG (MOD) 24 hr tablet Take 500 mg by mouth. Take 2 tabs twice daily     Omega-3 Fatty Acids (FISH OIL) 1200 MG CAPS Take 1 capsule by mouth 2 (two) times daily.     VICTOZA 18 MG/3ML SOPN INJECT 0.3 ML'S (1.8 MG TOTAL) INTO THE SKIN DAILY 9 pen 3   No current facility-administered medications on file prior to visit.    Allergies: No Known Allergies Past Medical History:  Past Medical History:  Diagnosis Date   Diabetes (HCC)    Diplopia    Headache    Past Surgical History:  Past Surgical History:  Procedure Laterality Date   BUNIONECTOMY     Social History:  Social History   Socioeconomic History   Marital status: Married    Spouse name: Not on file   Number of children: 3   Years of education: college   Highest education level: Master's degree (e.g., MA, MS, MEng, MEd, MSW, MBA)  Occupational History   Not on file  Tobacco Use   Smoking status: Never   Smokeless tobacco: Never  Substance and Sexual Activity   Alcohol use: No    Comment: none since 1996   Drug use: No   Sexual activity: Not on file  Other Topics Concern   Not on file  Social History Narrative   Lives at home with his  wife.   Right-handed.   One can of Coke Zero per day.   Social Drivers of Corporate investment banker Strain: High Risk (07/10/2023)   Received from [facility]   Overall Financial Resource Strain (CARDIA)    Difficulty of Paying Living Expenses: Hard  Food Insecurity: No Food Insecurity (07/10/2023)   Received from [facility]   Hunger Vital Sign    Worried About Running Out of Food in the Last Year: Never true    Ran Out of Food in the Last Year: Never true  Transportation Needs: No Transportation Needs (07/10/2023)   Received from [facility] - Transportation    In the past 12 months, has lack of transportation kept you  from medical appointments or from getting medications?: No    Lack of Transportation (Non-Medical): No  Physical Activity: Not on file  Stress: Not on file  Social Connections: Not on file  Intimate Partner Violence: Not on file   Family History:  Family History  Problem Relation Age of Onset   Heart disease Other    Diabetes Other    Diabetes Father    Stroke Father        x 2   Heart disease Father    Diabetes Sister    Diabetes Brother    Heart disease Mother     Review of Systems: Constitutional: Doesn't report fevers, chills or abnormal weight loss Eyes: Doesn't report blurriness of vision Ears, nose, mouth, throat, and face: Doesn't report sore throat Respiratory: Doesn't report cough, dyspnea or wheezes Cardiovascular: Doesn't report palpitation, chest discomfort  Gastrointestinal:  Doesn't report nausea, constipation, diarrhea GU: Doesn't report incontinence Skin: Doesn't report skin rashes Neurological: Per HPI Musculoskeletal: Doesn't report joint pain Behavioral/Psych: Doesn't report anxiety  Physical Exam: Vitals:   07/27/23 1406 07/27/23 1407  BP: (!) 160/86 (!) 168/88  Pulse: (!) 104   Resp: 17   Temp: 97.7 F (36.5 C)   SpO2: 100%    KPS: 80. General: Alert, cooperative, pleasant, in no acute distress Head: Normal EENT: No conjunctival injection or scleral icterus.  Lungs: Resp effort normal Cardiac: Regular rate Abdomen: Non-distended abdomen Skin: No rashes cyanosis or petechiae. Extremities: No clubbing or edema  Neurologic Exam: Mental Status: Awake, alert, attentive to examiner. Oriented to self and environment. Language is fluent with intact comprehension.  Cranial Nerves: Visual acuity is grossly normal. Visual fields are full. Extra-ocular movements intact. No ptosis. Face is symmetric Motor: Tone and bulk are normal. Power is full in both arms and legs. Reflexes are symmetric, no pathologic reflexes present.  Sensory: Intact to light  touch Gait: Normal.   Labs: I have reviewed the data as listed    Component Value Date/Time   NA 139 12/19/2018 0858   K 5.0 12/19/2018 0858   CL 101 12/19/2018 0858   CO2 23 12/19/2018 0858   GLUCOSE 208 (H) 12/19/2018 0858   BUN 14 12/19/2018 0858   CREATININE 1.03 12/19/2018 0858   CALCIUM 9.9 12/19/2018 0858   PROT 6.8 12/19/2018 0858   ALBUMIN 4.6 12/19/2018 0858   AST 22 12/19/2018 0858   ALT 27 12/19/2018 0858   ALKPHOS 87 12/19/2018 0858   BILITOT 0.4 12/19/2018 0858   GFRNONAA 77 12/19/2018 0858   GFRAA 90 12/19/2018 0858   Lab Results  Component Value Date   WBC 6.6 12/19/2018   NEUTROABS 4.3 12/19/2018   HGB 15.2 12/19/2018   HCT  44.0 12/19/2018   MCV 85 12/19/2018   PLT 240 12/19/2018    Imaging: MRI BRAIN WITHOUT AND WITH CONTRAST   INDICATION: brain tumor, D49.6 Neoplasm of unspecified behavior of brain  (CMS/HHS-HCC)    COMPARISON: 07/03/2023 CT brain, 07/03/2023 MRI brain   TECHNIQUE/PROTOCOL: Brain tumor protocol without and with IV contrast.    FINDINGS:  Brain Parenchyma:  Status post right parietal craniotomy with resection  cavity in the precuneus for resection of the diffuse infiltrative mass  centered in the parietal lobes. There is surrounding edema and locoregional  mass effect with sulcal effacement and 0.3 cm of leftward midline shift.  Expansile signal abnormality within right posterior cingulate in addition  to a nodular enhancement along the anterior margin of the surgical  resection margin within the posterior cingulate gyrus measuring 1.2 x 1.4 x  0.9 cm (series 13, image 112) is noted. Thin curvilinear enhancement  surrounding the resection cavity is likely postsurgical. Restricted  diffusion along the margins of the resection cavity likely reflects  post-surgical changes/the visualized tissue.   There is a separate increased signal within the right frontobasilar region  without enhancement. Ventricles and Sulci: Re-expansion  of the right  lateral ventricle atrium.  Extra-Axial Spaces: Small amount of right frontal pneumocephalus.  Extra-axial collection directly subjacent to the craniotomy measures 1.0  cm.  Unchanged planum sphenoidal meningioma. Diffuse right greater than  left pachymeningeal thickening.   Basal Cisterns: Normal.  Intracranial Flow-Voids: Normal.   Paranasal Sinuses: Large right maxillary mucous retention cysts.  Mastoid air cells: well aerated  Orbits: Normal.  Cranium: Normal.  Visualized upper cervical spine: No high grade stenosis.   IMPRESSION:  1. Status post right craniotomy with large large resection cavity in the  precuneus. There is nodular enhancement along the posterior cingulate gyrus  which may represent residual mass. Recommend attention on follow-up.  2.  Increased signal within the right frontobasilar region without  enhancement.  Evaluation is limited by poor signal to noise ratio of the  FLAIR sequences, however findings could represent a second focus of glial  tumor.  3.     Electronically Reviewed by:  Arminda Resides, MD, [facility]  Electronically Reviewed on:  07/06/2023 2:18 PM   Pathology: A, B. Brain, right parietal lesion, resection:   Glioblastoma, IDH-wildtype, CNS WHO Grade 4.    Comment: IDH status was determined by immunostaining for the IDH1 R132H mutation; in patients 36 and older, this immunophenotype almost always indicates IDH-wildtype status.   ** Patients and their health care providers are always welcome to call our office [phone]) to arrange to discuss the pathologic findings with me by phone or at the microscope (in person or virtually), or to email me at [email or web address]. As a surgical pathologist, a physician who diagnoses disease by microscopic examination of tissue, I am available as a member of the health care team to answer any questions you may have about the diagnosis.  For tips on how to understand your pathology  report: [email or web address] **   Electronically signed by Marolyn Hammock, MD on 07/10/2023     Assessment/Plan Glioblastoma, IDH-wildtype [facility])  We appreciate the opportunity to participate in the care of MONTAVIOUS WIERZBA.  He is clinically stable today, now having completed surgery for his right parietal glioblastoma.    We had an extensive conversation with him regarding pathology, prognosis, and available treatment pathways.    We ultimately recommended proceeding with course of intensity modulated radiation therapy and concurrent daily Temozolomide.  Radiation will be administered Mon-Fri over 6 weeks, Temodar will be dosed at 75mg /m2 to be given daily over 42 days.  We reviewed side effects of temodar, including fatigue, nausea/vomiting, constipation, and cytopenias.  Informed consent was verbally obtained at bedside to proceed with oral chemotherapy.  Chemotherapy should be held for the following:  ANC less than 1,000  Platelets less than 100,000  LFT or creatinine greater than 2x ULN  If clinical concerns/contraindications develop  Every 2 weeks during radiation, labs will be checked accompanied by a clinical evaluation in the brain tumor clinic.  Screening for potential clinical trials was performed and discussed using eligibility criteria for active protocols at [facility], loco-regional tertiary centers, as well as national database available on GroundTransfer.at.    The patient is not a candidate for a research protocol at this time due to no suitable study identified.   We spent twenty additional minutes teaching regarding the natural history, biology, and historical experience in the treatment of brain tumors. We then discussed in detail the current recommendations for therapy focusing on the mode of administration, mechanism of action, anticipated toxicities, and quality of life issues associated with this plan. We also provided teaching sheets for the patient  to take home as an additional resource.  All questions were answered. The patient knows to call the clinic with any problems, questions or concerns. No barriers to learning were detected.  The total time spent in the encounter was 60 minutes and more than 50% was on counseling and review of test results   Henreitta Leber, MD Medical Director of Neuro-Oncology [facility] 07/27/23 2:05 PM

## 2023-07-27 NOTE — Telephone Encounter (Signed)
 Oral Oncology Patient Advocate Encounter  After completing a benefits investigation, prior authorization for temozolomide is not required at this time through [facility].  Patient's copay is $154.75.     Omer Jack, CPhT-Adv Oncology Pharmacy Patient Advocate [facility]  Direct Number: [phone]  Fax: [phone]

## 2023-07-28 ENCOUNTER — Other Ambulatory Visit: Payer: Self-pay

## 2023-07-28 ENCOUNTER — Encounter: Payer: Self-pay | Admitting: Radiation Oncology

## 2023-07-28 ENCOUNTER — Ambulatory Visit
Admission: RE | Admit: 2023-07-28 | Discharge: 2023-07-28 | Disposition: A | Discharge status: Home / Self Care | Payer: Self-pay | Source: Ambulatory Visit | Attending: Radiation Oncology | Admitting: Radiation Oncology

## 2023-07-28 ENCOUNTER — Ambulatory Visit: Payer: Self-pay | Admitting: Radiation Oncology

## 2023-07-28 ENCOUNTER — Other Ambulatory Visit: Payer: Self-pay | Admitting: Pharmacy Technician

## 2023-07-28 ENCOUNTER — Other Ambulatory Visit (HOSPITAL_COMMUNITY): Payer: Self-pay

## 2023-07-28 VITALS — BP 135/78 | HR 90 | Temp 97.3°F | Resp 18 | Ht 73.0 in | Wt 208.4 lb

## 2023-07-28 DIAGNOSIS — Z79899 Other long term (current) drug therapy: Secondary | ICD-10-CM | POA: Diagnosis not present

## 2023-07-28 DIAGNOSIS — Z79630 Long term (current) use of alkylating agent: Secondary | ICD-10-CM | POA: Insufficient documentation

## 2023-07-28 DIAGNOSIS — Z7985 Long-term (current) use of injectable non-insulin antidiabetic drugs: Secondary | ICD-10-CM | POA: Diagnosis not present

## 2023-07-28 DIAGNOSIS — Z7982 Long term (current) use of aspirin: Secondary | ICD-10-CM | POA: Diagnosis not present

## 2023-07-28 DIAGNOSIS — E119 Type 2 diabetes mellitus without complications: Secondary | ICD-10-CM | POA: Diagnosis not present

## 2023-07-28 DIAGNOSIS — Z7984 Long term (current) use of oral hypoglycemic drugs: Secondary | ICD-10-CM | POA: Diagnosis not present

## 2023-07-28 DIAGNOSIS — C713 Malignant neoplasm of parietal lobe: Secondary | ICD-10-CM | POA: Diagnosis not present

## 2023-07-28 DIAGNOSIS — Z510 Encounter for antineoplastic radiation therapy: Secondary | ICD-10-CM | POA: Diagnosis not present

## 2023-07-28 DIAGNOSIS — Z7902 Long term (current) use of antithrombotics/antiplatelets: Secondary | ICD-10-CM | POA: Insufficient documentation

## 2023-07-28 NOTE — Progress Notes (Signed)
 Location/Histology of Brain Tumor: Right Parietal Glioblastoma  Patient presented with symptoms of difficulties with driving navigation and confusion.  He was seen in the ED and imaging was obtained demonstrated a large enhancing mass within the right parietal lobe.   Past or anticipated interventions, if any, per neurosurgery:  Dr. Lanae Boast -Right parietal craniotomy 07/05/2023    Past or anticipated interventions, if any, per medical oncology:  Dr. Barbaraann Cao 07/27/2023 -We ultimately recommended proceeding with course of intensity modulated radiation therapy and concurrent daily Temozolomide.  -Radiation will be administered Mon-Fri over 6 weeks,    Dose of Decadron, if applicable: Completed course  Recent neurologic symptoms, if any:  Seizures: No Headaches: Occasional, across the front.  Takes tylenol PRN. Nausea: No Dizziness/ataxia: He continues to have some difficulty with balance. Difficulty with hand coordination: He reports some troubles with fine motor movement and coordination. Focal numbness/weakness: He reports some numbness/weakness in both thighs, feels like muscle fatigue, like its locked up. Visual deficits/changes: Decreased left peripheral vision. Confusion/Memory deficits: He continues to have word finding, confusion and memory loss.   SAFETY ISSUES: Prior radiation? No Pacemaker/ICD? No Possible current pregnancy? N/a Is the patient on methotrexate? No  Additional Complaints / other details:

## 2023-07-28 NOTE — Progress Notes (Addendum)
 Radiation Oncology         ([phone]) [phone] ________________________________  Name: Javier Graham        MRN: [id number]  Date of Service: 07/28/2023 DOB: 1956/04/20  [id number], Javier Pilgrim, MD  Javier Graham Javier Lea, MD     REFERRING PHYSICIAN: Henreitta Leber, MD   DIAGNOSIS: The encounter diagnosis was Parietal glioblastoma [facility]).   Glioblastoma, IDH-wildtype, CNS WHO Grade 4; s/p resection on 07/05/2023  HISTORY OF PRESENT ILLNESS: Javier Graham is a 68 y.o. male seen at the request of Dr. Barbaraann Graham. Patient originally presented to the ED for complaints of confusion and issues with driving navigation. MRI of the brain demonstrated a large enhancing mass within the right parietal lobe, consistent with primary brain tumor. He underwent surgery at [facility] on 07/05/2023 under the care of Dr. Allena Graham. Pathology revealed glioblastoma, IDH-wildtype, CNS WHO Grade 4.   He met with Dr. Barbaraann Graham on 07/27/2023 for consultation and evaluation. After extensive discussion with the patient, Dr. Barbaraann Graham ultimately recommended proceeding with a 6-week course of radiation and concurrent daily Temozolomide.  He was kindly referred today to discuss radiation treatment options.     PREVIOUS RADIATION THERAPY: No   PAST MEDICAL HISTORY:  Past Medical History:  Diagnosis Date   Diabetes (HCC)    Diplopia    Headache        PAST SURGICAL HISTORY: Past Surgical History:  Procedure Laterality Date   BUNIONECTOMY       FAMILY HISTORY:  Family History  Problem Relation Age of Onset   Heart disease Other    Diabetes Other    Diabetes Father    Stroke Father        x 2   Heart disease Father    Diabetes Sister    Diabetes Brother    Heart disease Mother      SOCIAL HISTORY:  reports that he has never smoked. He has never used smokeless tobacco. He reports that he does not drink alcohol and does not use drugs.   ALLERGIES: Patient has no known allergies.   MEDICATIONS:  Current Outpatient  Medications  Medication Sig Dispense Refill   ascorbic acid (VITAMIN C) 500 MG tablet Take 500 mg by mouth daily.     aspirin EC 81 MG tablet Take 81 mg by mouth daily.     Blood Glucose Monitoring Suppl (ACCU-CHEK GUIDE) w/Device KIT USE AS DIRECTED TO CHECK BLOOD GLUCOSE     clopidogrel (PLAVIX) 75 MG tablet Take 1 tablet (75 mg total) by mouth daily. 30 tablet 11   dapagliflozin propanediol (FARXIGA) 5 MG TABS tablet Take 5 mg by mouth daily. 30 tablet 11   glimepiride (AMARYL) 4 MG tablet Take 1 tablet (4 mg total) by mouth daily before breakfast. 30 tablet 3   lisinopril (ZESTRIL) 5 MG tablet Take 5 mg by mouth daily.     Melatonin 5 MG TABS Take 1 tablet by mouth as needed.     metFORMIN (GLUCOPHAGE) 500 MG tablet Take 1,000 mg by mouth 2 times daily at 12 noon and 4 pm.     metFORMIN (GLUMETZA) 500 MG (MOD) 24 hr tablet Take 500 mg by mouth. Take 2 tabs twice daily     Omega-3 Fatty Acids (FISH OIL) 1200 MG CAPS Take 1 capsule by mouth 2 (two) times daily.     [START ON 08/03/2023] ondansetron (ZOFRAN) 8 MG tablet Take 1 tablet (8 mg total) by mouth every 8 (eight) hours as needed for nausea  or vomiting. May take 30-60 minutes prior to Temodar administration if nausea/vomiting occurs as needed. 30 tablet 1   polyethylene glycol powder (GLYCOLAX/MIRALAX) 17 GM/SCOOP powder Take by mouth daily as needed.     PRECISION QID TEST test strip 1 each (1 strip total) 4 (four) times daily before meals and nightly for 30 days Product selection permitted according to insurance preference. E11.9 Type 2 diabetes mellitus     Sennosides (SENOKOT PO) Take 1 tablet by mouth daily as needed.     [START ON 08/03/2023] temozolomide (TEMODAR) 140 MG capsule Take 1 capsule (140 mg total) by mouth daily. (Take with one 20 mg capsule for total daily dose of 160 mg). May take on an empty stomach to decrease nausea & vomiting. 42 capsule 0   [START ON 08/03/2023] temozolomide (TEMODAR) 20 MG capsule Take 1 capsule (20  mg total) by mouth daily. (Take with one 140 mg capsule for total daily dose of 160 mg). May take on an empty stomach to decrease nausea & vomiting. 42 capsule 0   VICTOZA 18 MG/3ML SOPN INJECT 0.3 ML'S (1.8 MG TOTAL) INTO THE SKIN DAILY 9 pen 3   No current facility-administered medications for this encounter.     REVIEW OF SYSTEMS: On review of systems, the patient reports pain behind his left eye along with left-sided peripheral vision loss. The patient's wife notes some word finding difficulty and issues with space navigation.      PHYSICAL EXAM:  Wt Readings from Last 3 Encounters:  07/28/23 208 lb 6 oz (94.5 kg)  07/27/23 210 lb (95.3 kg)  05/01/19 232 lb 9.6 oz (105.5 kg)   Temp Readings from Last 3 Encounters:  07/28/23 (!) 97.3 F (36.3 C) (Temporal)  07/27/23 97.7 F (36.5 C) (Temporal)  05/01/19 (!) 96.8 F (36 C)   BP Readings from Last 3 Encounters:  07/28/23 135/78  07/27/23 (!) 168/88  05/01/19 130/76   Pulse Readings from Last 3 Encounters:  07/28/23 90  07/27/23 (!) 104  05/01/19 83   Pain Assessment Pain Score: 3  Pain Loc: Head/10  In general this is a well appearing male in no acute distress. He's alert and oriented x4 and appropriate throughout the examination. Cardiopulmonary assessment is negative for acute distress and he exhibits normal effort.   Well healed surgical incision at the right parietal region. No signs of infection or delayed wound healing. Skin edges are well approximated.    ECOG = 1  0 - Asymptomatic (Fully active, able to carry on all predisease activities without restriction)  1 - Symptomatic but completely ambulatory (Restricted in physically strenuous activity but ambulatory and able to carry out work of a light or sedentary nature. For example, light housework, office work)  2 - Symptomatic, <50% in bed during the day (Ambulatory and capable of all self care but unable to carry out any work activities. Up and about more  than 50% of waking hours)  3 - Symptomatic, >50% in bed, but not bedbound (Capable of only limited self-care, confined to bed or chair 50% or more of waking hours)  4 - Bedbound (Completely disabled. Cannot carry on any self-care. Totally confined to bed or chair)  5 - Death   Santiago Glad MM, Creech RH, Tormey DC, et al. [phone]). "Toxicity and response criteria of the [facility]". Am. Evlyn Clines. Oncol. 5 (6): 649-55    LABORATORY DATA:  Lab Results  Component Value Date   WBC 6.6 12/19/2018  HGB 15.2 12/19/2018   HCT 44.0 12/19/2018   MCV 85 12/19/2018   PLT 240 12/19/2018   Lab Results  Component Value Date   NA 139 12/19/2018   K 5.0 12/19/2018   CL 101 12/19/2018   CO2 23 12/19/2018   Lab Results  Component Value Date   ALT 27 12/19/2018   AST 22 12/19/2018   ALKPHOS 87 12/19/2018   BILITOT 0.4 12/19/2018      RADIOGRAPHY: No results found.     IMPRESSION/PLAN: 1. Glioblastoma, IDH-wildtype, CNS WHO Grade 4; s/p resection on 07/05/2023  Today, we talked to the patient about the findings and work-up thus far.  We discussed the patient's diagnosis of high grade glioma and general treatment for this, highlighting the role of radiotherapy in the management. Dr. Mitzi Hansen recommends a 6-week course of IMRT partial brain radiotherapy given with daily Temodar. We discussed the available radiation techniques, and focused on the details of logistics and delivery.    We discussed the risks, benefits, and side effects of a 6-week course of radiation. Side effects may include but not necessarily be limited to: hair loss, scalp irritation, fatigue, cognitive decline or brain injury.  No guarantees of treatment were given. A consent form was signed and placed in the patient's medical record. The patient was encouraged to ask questions that I answered to the best of my ability.    The patient is scheduled for CT simulation. Anticipate starting treatment on 08/07/2023 to  allow for adequate healing from surgery.   In a visit lasting 60 minutes, greater than 50% of the time was spent face to face discussing the patient's condition, in preparation for the discussion, and coordinating the patient's care.   The above documentation reflects my direct findings during this shared patient visit. Please see the separate note by Dr. Mitzi Hansen on this date for the remainder of the patient's plan of care.    Bryan Lemma, PA-C   **Disclaimer: This note was dictated with voice recognition software. Similar sounding words can inadvertently be transcribed and this note may contain transcription errors which may not have been corrected upon publication of note.**

## 2023-07-28 NOTE — Progress Notes (Signed)
 Has armband been applied?  Yes.    Does patient have an allergy to IV contrast dye?: No.   Has patient ever received premedication for IV contrast dye?: No.   Does patient take metformin?: Yes.    If patient does take metformin when was the last dose: July 28, 2023  Date of lab work: 07/06/2023 BUN: 14 CR: 0.9 eGfr: >60  IV site: antecubital left, condition patent and no redness  Has IV site been added to flowsheet?  Yes.    There were no vitals taken for this visit.

## 2023-07-28 NOTE — Progress Notes (Signed)
 Specialty Pharmacy Initial Fill Coordination Note  Javier Graham is a 68 y.o. male contacted today regarding refills of specialty medication(s) Temozolomide (TEMODAR) .  Patient requested [name] at [facility] [place]  on 07/31/23   Medication will be filled on 07/31/23.   Patient is aware of $154.75 copayment.

## 2023-07-28 NOTE — Telephone Encounter (Signed)
 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   Counseled on administration, dosing, side effects, monitoring, drug-food interactions, safe handling, storage, and disposal.  Patient will take Temodar 140mg  capsules and Temodar 20mg  capsules, 160 mg total daily dose, by mouth once daily, may take at bedtime and on an empty stomach to decrease nausea and vomiting.  Patient will take Temodar concurrent with radiation for 42 days straight.  Temodar and radiation start date: pending - patient's wife knows patient will start TMZ the night prior to first radiation treatment  Adverse effects include but are not limited to: nausea, vomiting, anorexia, GI upset, rash, and fatigue.  Prophylactic Zofran will not be used at initiation of concurrent phase, but will be initiated if nausea develops despite Temodar administration on an empty stomach and at bedtime. If this occurs, patient will take Zofran 8 mg tablet, 1 tablet by mouth 30-60 min prior to Temodar dose to help decrease N/V   Reviewed importance of keeping a medication schedule and plan for any missed doses. No barriers to medication adherence identified.  Medication reconciliation performed and medication/allergy list updated.  All questions answered.  Ms. Farace voiced understanding and appreciation.   Medication education handout placed in mail for patient and patient's wife. Patient's wife knows to call the office with questions or concerns. Oral Chemotherapy Clinic phone number provided.   Sherry Ruffing, PharmD, BCPS, BCOP Hematology/Oncology Clinical Pharmacist [facility] Oral Chemotherapy Navigation Clinics [phone] 07/28/2023 10:08 AM

## 2023-07-28 NOTE — Progress Notes (Signed)
 Oral Chemotherapy Pharmacist Encounter  Patient's wife was counseled under telephone encounter from 07/27/23.  Sherry Ruffing, PharmD, BCPS, BCOP Hematology/Oncology Clinical Pharmacist [facility] Oral Chemotherapy Navigation Clinics [phone] 07/28/2023 10:11 AM

## 2023-07-29 ENCOUNTER — Other Ambulatory Visit: Payer: Self-pay

## 2023-07-31 ENCOUNTER — Other Ambulatory Visit: Payer: Self-pay

## 2023-07-31 ENCOUNTER — Encounter: Payer: Self-pay | Admitting: *Deleted

## 2023-07-31 ENCOUNTER — Other Ambulatory Visit (HOSPITAL_COMMUNITY): Payer: Self-pay

## 2023-07-31 NOTE — Progress Notes (Signed)
 Temodar ordered Radiation Oncology start date 08/07/2023 Radiation end date 09/15/2023  Requested lab and MD visits with Dr Barbaraann Cao.

## 2023-08-01 ENCOUNTER — Inpatient Hospital Stay: Admitting: Licensed Clinical Social Worker

## 2023-08-01 DIAGNOSIS — C719 Malignant neoplasm of brain, unspecified: Secondary | ICD-10-CM

## 2023-08-01 NOTE — Progress Notes (Signed)
 [facility] Clinical Social Work  Initial Assessment   Javier Graham is a 68 y.o. year old male, wife contacted by phone and provided all details for assessment. Clinical Social Work was referred by medical provider for assessment of psychosocial needs.   SDOH (Social Determinants of Health) assessments performed: Yes SDOH Interventions    Flowsheet Row Office Visit from 07/27/2023 in [facility] - A Dept Of [place]. [facility]  SDOH Interventions   Food Insecurity Interventions Intervention Not Indicated  Housing Interventions Intervention Not Indicated       SDOH Screenings   Food Insecurity: No Food Insecurity (07/27/2023)  Housing: Unknown (07/27/2023)  Transportation Needs: No Transportation Needs (07/10/2023)   Received from [facility]  Utilities: Not At Risk (07/05/2023)   Received from [facility]  Financial Resource Strain: High Risk (07/10/2023)   Received from [facility]  Tobacco Use: Low Risk  (07/28/2023)     Distress Screen completed: Yes     No data to display            Family/Social Information:  Housing Arrangement: patient lives with his wife. Family members/support persons in your life? Pt has a daughter in [place] as well as a cousin and nephew locally who are able to assist as needed.   Transportation concerns: no  Employment: Retired .  Income source: Actor concerns: No Type of concern: None Food access concerns: no Religious or spiritual practice: Not known Advanced directives: Not known Services Currently in place:  none  Coping/ Adjustment to diagnosis: Patient understands treatment plan and what happens next? yes Concerns about diagnosis and/or treatment: Overwhelmed by information and Quality of life Patient reported stressors: Anxiety/ nervousness and Adjusting to my illness Hopes and/or priorities: pt's priority is to start  treatment w/ the hope of positive results. Patient enjoys time with family/ friends Current coping skills/ strengths: Motivation for treatment/growth , Physical Health , and Supportive family/friends     SUMMARY: Current SDOH Barriers:  No barriers identified at this time  Clinical Social Work Clinical Goal(s):  No clinical social work goals at this time  Interventions: Discussed common feeling and emotions when being diagnosed with cancer, and the importance of support during treatment Informed patient of the support team roles and support services at [facility] Provided CSW contact information and encouraged patient to call with any questions or concerns Referred patient to [employer].   Follow Up Plan: CSW will see patient on April 1st to complete additional assessments. Patient verbalizes understanding of plan: Yes    Rachel Moulds, LCSW Clinical Social Worker [facility]

## 2023-08-03 ENCOUNTER — Telehealth: Payer: Self-pay | Admitting: *Deleted

## 2023-08-03 NOTE — Telephone Encounter (Signed)
 Temodar ordered for this patient Radiation oncology start date - 08/07/23 Radiation oncology end date - 09/15/23  Requested lab & MD visits with Dr Barbaraann Cao around weeks 2, 4, 6.  Scheduling request sent on 08/03/23.

## 2023-08-04 DIAGNOSIS — Z510 Encounter for antineoplastic radiation therapy: Secondary | ICD-10-CM | POA: Diagnosis not present

## 2023-08-04 DIAGNOSIS — C713 Malignant neoplasm of parietal lobe: Secondary | ICD-10-CM | POA: Diagnosis not present

## 2023-08-06 DIAGNOSIS — C713 Malignant neoplasm of parietal lobe: Secondary | ICD-10-CM | POA: Diagnosis not present

## 2023-08-07 ENCOUNTER — Ambulatory Visit
Admission: RE | Admit: 2023-08-07 | Discharge: 2023-08-07 | Disposition: A | Discharge status: Home / Self Care | Source: Ambulatory Visit | Attending: Radiation Oncology | Admitting: Radiation Oncology

## 2023-08-07 ENCOUNTER — Other Ambulatory Visit: Payer: Self-pay

## 2023-08-07 DIAGNOSIS — C713 Malignant neoplasm of parietal lobe: Secondary | ICD-10-CM | POA: Diagnosis not present

## 2023-08-07 DIAGNOSIS — Z510 Encounter for antineoplastic radiation therapy: Secondary | ICD-10-CM | POA: Diagnosis not present

## 2023-08-07 LAB — RAD ONC ARIA SESSION SUMMARY
Course Elapsed Days: 0
Plan Fractions Treated to Date: 1
Plan Prescribed Dose Per Fraction: 2 Gy
Plan Total Fractions Prescribed: 23
Plan Total Prescribed Dose: 46 Gy
Reference Point Dosage Given to Date: 2 Gy
Reference Point Session Dosage Given: 2 Gy
Session Number: 1

## 2023-08-08 ENCOUNTER — Telehealth: Payer: Self-pay | Admitting: Internal Medicine

## 2023-08-08 ENCOUNTER — Ambulatory Visit
Admission: RE | Admit: 2023-08-08 | Discharge: 2023-08-08 | Disposition: A | Discharge status: Home / Self Care | Source: Ambulatory Visit | Attending: Radiation Oncology | Admitting: Radiation Oncology

## 2023-08-08 ENCOUNTER — Inpatient Hospital Stay: Attending: Internal Medicine | Admitting: Licensed Clinical Social Worker

## 2023-08-08 ENCOUNTER — Other Ambulatory Visit: Payer: Self-pay

## 2023-08-08 DIAGNOSIS — C719 Malignant neoplasm of brain, unspecified: Secondary | ICD-10-CM

## 2023-08-08 DIAGNOSIS — C713 Malignant neoplasm of parietal lobe: Secondary | ICD-10-CM | POA: Insufficient documentation

## 2023-08-08 DIAGNOSIS — Z510 Encounter for antineoplastic radiation therapy: Secondary | ICD-10-CM | POA: Diagnosis not present

## 2023-08-08 DIAGNOSIS — R5383 Other fatigue: Secondary | ICD-10-CM | POA: Insufficient documentation

## 2023-08-08 LAB — RAD ONC ARIA SESSION SUMMARY
Course Elapsed Days: 1
Plan Fractions Treated to Date: 2
Plan Prescribed Dose Per Fraction: 2 Gy
Plan Total Fractions Prescribed: 23
Plan Total Prescribed Dose: 46 Gy
Reference Point Dosage Given to Date: 4 Gy
Reference Point Session Dosage Given: 2 Gy
Session Number: 2

## 2023-08-08 NOTE — Telephone Encounter (Signed)
 Scheduled Jillyn Hidden 6 weeks out per a staff message received.

## 2023-08-08 NOTE — Progress Notes (Signed)
 Brain Tumor Assessment   Clinical social worker met with patient / caregiver -Javier Graham  to complete assessments.      Patient distress screen score:     08/08/2023    2:34 PM  ONCBCN DISTRESS SCREENING  Screening Type Initial Screening  Distress experienced in past week (1-10) 0  Emotional problem type Depression;Nervousness/Anxiety;Adjusting to illness;Boredom  Physical Problem type --  Physician notified of physical symptoms Yes  Referral to clinical psychology No  Referral to clinical social work Yes  Referral to dietition No  Referral to financial advocate No  Referral to support programs No  Referral to palliative care No   Caregiver distress screen score: 4 Significant physical limits/changes identified:  Yes: spouse reports pt becomes very anxious if he is unable to find her.  Pt's short term memory issues cause pt to forget where spouse may have said she went and pt will remain anxious until she returns home.  Pt also will become anxious if he isn't immediately able to find spouse in the home.         MOCA score:     08/08/2023    2:32 PM  Montreal Cognitive Assessment   Visuospatial/ Executive (0/5) 1  Naming (0/3) 3  Attention: Read list of digits (0/2) 2  Attention: Read list of letters (0/1) 1  Attention: Serial 7 subtraction starting at 100 (0/3) 3  Language: Repeat phrase (0/2) 1  Language : Fluency (0/1) 0  Abstraction (0/2) 2  Delayed Recall (0/5) 0  Orientation (0/6) 4  Total 17  Adjusted Score (based on education) 17   Significant findings: Visuospatial/executive: pt unable to begin the task of identifying the pattern, unable to copy a cube, able to draw the contour of the clock only Memory: pt's short term memory significantly impacted Language: pt able to repeat a short sentence, but only able to recall half of a longer sentence Delayed recall: pt unable to recall any of the 5 words after 5 minutes Orientation: pt not oriented to place or city  WHO  Quality of Life scores:   Overall quality of life: 44   Physical health: 90   Psychological: 43   Social relationship: 60   Environment: 63   PATH2Caregiving score: 86     Rachel Moulds, LCSW

## 2023-08-09 ENCOUNTER — Ambulatory Visit
Admission: RE | Admit: 2023-08-09 | Discharge: 2023-08-09 | Disposition: A | Discharge status: Home / Self Care | Source: Ambulatory Visit | Attending: Radiation Oncology | Admitting: Radiation Oncology

## 2023-08-09 ENCOUNTER — Other Ambulatory Visit: Payer: Self-pay

## 2023-08-09 DIAGNOSIS — R5383 Other fatigue: Secondary | ICD-10-CM | POA: Diagnosis not present

## 2023-08-09 DIAGNOSIS — C713 Malignant neoplasm of parietal lobe: Secondary | ICD-10-CM | POA: Diagnosis not present

## 2023-08-09 DIAGNOSIS — Z510 Encounter for antineoplastic radiation therapy: Secondary | ICD-10-CM | POA: Diagnosis not present

## 2023-08-09 LAB — RAD ONC ARIA SESSION SUMMARY
Course Elapsed Days: 2
Plan Fractions Treated to Date: 3
Plan Prescribed Dose Per Fraction: 2 Gy
Plan Total Fractions Prescribed: 23
Plan Total Prescribed Dose: 46 Gy
Reference Point Dosage Given to Date: 6 Gy
Reference Point Session Dosage Given: 2 Gy
Session Number: 3

## 2023-08-10 ENCOUNTER — Institutional Professional Consult (permissible substitution): Payer: Medicare Other | Admitting: Neurology

## 2023-08-10 ENCOUNTER — Ambulatory Visit
Admission: RE | Admit: 2023-08-10 | Discharge: 2023-08-10 | Disposition: A | Discharge status: Home / Self Care | Source: Ambulatory Visit | Attending: Radiation Oncology | Admitting: Radiation Oncology

## 2023-08-10 ENCOUNTER — Other Ambulatory Visit: Payer: Self-pay

## 2023-08-10 ENCOUNTER — Ambulatory Visit: Payer: Self-pay | Admitting: Internal Medicine

## 2023-08-10 DIAGNOSIS — Z510 Encounter for antineoplastic radiation therapy: Secondary | ICD-10-CM | POA: Diagnosis not present

## 2023-08-10 DIAGNOSIS — C713 Malignant neoplasm of parietal lobe: Secondary | ICD-10-CM | POA: Diagnosis not present

## 2023-08-10 DIAGNOSIS — R5383 Other fatigue: Secondary | ICD-10-CM | POA: Diagnosis not present

## 2023-08-10 LAB — RAD ONC ARIA SESSION SUMMARY
Course Elapsed Days: 3
Plan Fractions Treated to Date: 4
Plan Prescribed Dose Per Fraction: 2 Gy
Plan Total Fractions Prescribed: 23
Plan Total Prescribed Dose: 46 Gy
Reference Point Dosage Given to Date: 8 Gy
Reference Point Session Dosage Given: 2 Gy
Session Number: 4

## 2023-08-11 ENCOUNTER — Ambulatory Visit
Admission: RE | Admit: 2023-08-11 | Discharge: 2023-08-11 | Disposition: A | Discharge status: Home / Self Care | Source: Ambulatory Visit | Attending: Radiation Oncology | Admitting: Radiation Oncology

## 2023-08-11 ENCOUNTER — Other Ambulatory Visit: Payer: Self-pay

## 2023-08-11 DIAGNOSIS — C713 Malignant neoplasm of parietal lobe: Secondary | ICD-10-CM | POA: Diagnosis not present

## 2023-08-11 DIAGNOSIS — C719 Malignant neoplasm of brain, unspecified: Secondary | ICD-10-CM

## 2023-08-11 DIAGNOSIS — Z510 Encounter for antineoplastic radiation therapy: Secondary | ICD-10-CM | POA: Diagnosis not present

## 2023-08-11 DIAGNOSIS — R5383 Other fatigue: Secondary | ICD-10-CM | POA: Diagnosis not present

## 2023-08-11 LAB — RAD ONC ARIA SESSION SUMMARY
Course Elapsed Days: 4
Plan Fractions Treated to Date: 5
Plan Prescribed Dose Per Fraction: 2 Gy
Plan Total Fractions Prescribed: 23
Plan Total Prescribed Dose: 46 Gy
Reference Point Dosage Given to Date: 10 Gy
Reference Point Session Dosage Given: 2 Gy
Session Number: 5

## 2023-08-11 MED ORDER — SONAFINE EX EMUL
1.0000 | Freq: Once | CUTANEOUS | Status: AC
  Administered 2023-08-11: 1 via TOPICAL

## 2023-08-14 ENCOUNTER — Other Ambulatory Visit: Payer: Self-pay

## 2023-08-14 ENCOUNTER — Ambulatory Visit
Admission: RE | Admit: 2023-08-14 | Discharge: 2023-08-14 | Disposition: A | Discharge status: Home / Self Care | Source: Ambulatory Visit | Attending: Radiation Oncology

## 2023-08-14 DIAGNOSIS — R5383 Other fatigue: Secondary | ICD-10-CM | POA: Diagnosis not present

## 2023-08-14 DIAGNOSIS — C713 Malignant neoplasm of parietal lobe: Secondary | ICD-10-CM | POA: Diagnosis not present

## 2023-08-14 DIAGNOSIS — Z510 Encounter for antineoplastic radiation therapy: Secondary | ICD-10-CM | POA: Diagnosis not present

## 2023-08-14 LAB — RAD ONC ARIA SESSION SUMMARY
Course Elapsed Days: 7
Plan Fractions Treated to Date: 6
Plan Prescribed Dose Per Fraction: 2 Gy
Plan Total Fractions Prescribed: 23
Plan Total Prescribed Dose: 46 Gy
Reference Point Dosage Given to Date: 12 Gy
Reference Point Session Dosage Given: 2 Gy
Session Number: 6

## 2023-08-15 ENCOUNTER — Other Ambulatory Visit: Payer: Self-pay

## 2023-08-15 ENCOUNTER — Ambulatory Visit
Admission: RE | Admit: 2023-08-15 | Discharge: 2023-08-15 | Disposition: A | Discharge status: Home / Self Care | Source: Ambulatory Visit | Attending: Radiation Oncology | Admitting: Radiation Oncology

## 2023-08-15 DIAGNOSIS — R5383 Other fatigue: Secondary | ICD-10-CM | POA: Diagnosis not present

## 2023-08-15 DIAGNOSIS — C713 Malignant neoplasm of parietal lobe: Secondary | ICD-10-CM | POA: Diagnosis not present

## 2023-08-15 DIAGNOSIS — Z510 Encounter for antineoplastic radiation therapy: Secondary | ICD-10-CM | POA: Diagnosis not present

## 2023-08-15 LAB — RAD ONC ARIA SESSION SUMMARY
Course Elapsed Days: 8
Plan Fractions Treated to Date: 7
Plan Prescribed Dose Per Fraction: 2 Gy
Plan Total Fractions Prescribed: 23
Plan Total Prescribed Dose: 46 Gy
Reference Point Dosage Given to Date: 14 Gy
Reference Point Session Dosage Given: 2 Gy
Session Number: 7

## 2023-08-16 ENCOUNTER — Ambulatory Visit
Admission: RE | Admit: 2023-08-16 | Discharge: 2023-08-16 | Disposition: A | Discharge status: Home / Self Care | Source: Ambulatory Visit | Attending: Radiation Oncology

## 2023-08-16 ENCOUNTER — Other Ambulatory Visit: Payer: Self-pay

## 2023-08-16 DIAGNOSIS — R5383 Other fatigue: Secondary | ICD-10-CM | POA: Diagnosis not present

## 2023-08-16 DIAGNOSIS — Z510 Encounter for antineoplastic radiation therapy: Secondary | ICD-10-CM | POA: Diagnosis not present

## 2023-08-16 DIAGNOSIS — C713 Malignant neoplasm of parietal lobe: Secondary | ICD-10-CM | POA: Diagnosis not present

## 2023-08-16 LAB — RAD ONC ARIA SESSION SUMMARY
Course Elapsed Days: 9
Plan Fractions Treated to Date: 8
Plan Prescribed Dose Per Fraction: 2 Gy
Plan Total Fractions Prescribed: 23
Plan Total Prescribed Dose: 46 Gy
Reference Point Dosage Given to Date: 16 Gy
Reference Point Session Dosage Given: 2 Gy
Session Number: 8

## 2023-08-17 ENCOUNTER — Ambulatory Visit
Admission: RE | Admit: 2023-08-17 | Discharge: 2023-08-17 | Disposition: A | Discharge status: Home / Self Care | Source: Ambulatory Visit | Attending: Radiation Oncology | Admitting: Radiation Oncology

## 2023-08-17 ENCOUNTER — Other Ambulatory Visit: Payer: Self-pay

## 2023-08-17 DIAGNOSIS — R5383 Other fatigue: Secondary | ICD-10-CM | POA: Diagnosis not present

## 2023-08-17 DIAGNOSIS — Z510 Encounter for antineoplastic radiation therapy: Secondary | ICD-10-CM | POA: Diagnosis not present

## 2023-08-17 DIAGNOSIS — C713 Malignant neoplasm of parietal lobe: Secondary | ICD-10-CM | POA: Diagnosis not present

## 2023-08-17 LAB — RAD ONC ARIA SESSION SUMMARY
Course Elapsed Days: 10
Plan Fractions Treated to Date: 9
Plan Prescribed Dose Per Fraction: 2 Gy
Plan Total Fractions Prescribed: 23
Plan Total Prescribed Dose: 46 Gy
Reference Point Dosage Given to Date: 18 Gy
Reference Point Session Dosage Given: 2 Gy
Session Number: 9

## 2023-08-18 ENCOUNTER — Other Ambulatory Visit: Payer: Self-pay

## 2023-08-18 ENCOUNTER — Ambulatory Visit
Admission: RE | Admit: 2023-08-18 | Discharge: 2023-08-18 | Disposition: A | Discharge status: Home / Self Care | Source: Ambulatory Visit | Attending: Radiation Oncology | Admitting: Radiation Oncology

## 2023-08-18 DIAGNOSIS — Z510 Encounter for antineoplastic radiation therapy: Secondary | ICD-10-CM | POA: Diagnosis not present

## 2023-08-18 DIAGNOSIS — R5383 Other fatigue: Secondary | ICD-10-CM | POA: Diagnosis not present

## 2023-08-18 DIAGNOSIS — C713 Malignant neoplasm of parietal lobe: Secondary | ICD-10-CM | POA: Diagnosis not present

## 2023-08-18 LAB — RAD ONC ARIA SESSION SUMMARY
Course Elapsed Days: 11
Plan Fractions Treated to Date: 10
Plan Prescribed Dose Per Fraction: 2 Gy
Plan Total Fractions Prescribed: 23
Plan Total Prescribed Dose: 46 Gy
Reference Point Dosage Given to Date: 20 Gy
Reference Point Session Dosage Given: 2 Gy
Session Number: 10

## 2023-08-21 ENCOUNTER — Other Ambulatory Visit: Payer: Self-pay

## 2023-08-21 ENCOUNTER — Inpatient Hospital Stay

## 2023-08-21 ENCOUNTER — Inpatient Hospital Stay: Admitting: Internal Medicine

## 2023-08-21 ENCOUNTER — Ambulatory Visit
Admission: RE | Admit: 2023-08-21 | Discharge: 2023-08-21 | Disposition: A | Discharge status: Home / Self Care | Source: Ambulatory Visit | Attending: Radiation Oncology | Admitting: Radiation Oncology

## 2023-08-21 VITALS — BP 127/73 | HR 88 | Temp 98.6°F | Resp 17 | Wt 214.6 lb

## 2023-08-21 DIAGNOSIS — Z510 Encounter for antineoplastic radiation therapy: Secondary | ICD-10-CM | POA: Diagnosis not present

## 2023-08-21 DIAGNOSIS — C719 Malignant neoplasm of brain, unspecified: Secondary | ICD-10-CM

## 2023-08-21 DIAGNOSIS — C713 Malignant neoplasm of parietal lobe: Secondary | ICD-10-CM | POA: Diagnosis not present

## 2023-08-21 DIAGNOSIS — R5383 Other fatigue: Secondary | ICD-10-CM | POA: Diagnosis not present

## 2023-08-21 LAB — RAD ONC ARIA SESSION SUMMARY
Course Elapsed Days: 14
Plan Fractions Treated to Date: 11
Plan Prescribed Dose Per Fraction: 2 Gy
Plan Total Fractions Prescribed: 23
Plan Total Prescribed Dose: 46 Gy
Reference Point Dosage Given to Date: 22 Gy
Reference Point Session Dosage Given: 2 Gy
Session Number: 11

## 2023-08-21 LAB — CBC WITH DIFFERENTIAL (CANCER CENTER ONLY)
Abs Immature Granulocytes: 0.04 10*3/uL (ref 0.00–0.07)
Basophils Absolute: 0.1 10*3/uL (ref 0.0–0.1)
Basophils Relative: 1 %
Eosinophils Absolute: 0.7 10*3/uL — ABNORMAL HIGH (ref 0.0–0.5)
Eosinophils Relative: 10 %
HCT: 38.2 % — ABNORMAL LOW (ref 39.0–52.0)
Hemoglobin: 13.1 g/dL (ref 13.0–17.0)
Immature Granulocytes: 1 %
Lymphocytes Relative: 18 %
Lymphs Abs: 1.3 10*3/uL (ref 0.7–4.0)
MCH: 30.7 pg (ref 26.0–34.0)
MCHC: 34.3 g/dL (ref 30.0–36.0)
MCV: 89.5 fL (ref 80.0–100.0)
Monocytes Absolute: 0.5 10*3/uL (ref 0.1–1.0)
Monocytes Relative: 7 %
Neutro Abs: 4.7 10*3/uL (ref 1.7–7.7)
Neutrophils Relative %: 63 %
Platelet Count: 220 10*3/uL (ref 150–400)
RBC: 4.27 MIL/uL (ref 4.22–5.81)
RDW: 13.2 % (ref 11.5–15.5)
WBC Count: 7.3 10*3/uL (ref 4.0–10.5)
nRBC: 0 % (ref 0.0–0.2)

## 2023-08-21 LAB — CMP (CANCER CENTER ONLY)
ALT: 20 U/L (ref 0–44)
AST: 17 U/L (ref 15–41)
Albumin: 4.3 g/dL (ref 3.5–5.0)
Alkaline Phosphatase: 66 U/L (ref 38–126)
Anion gap: 5 (ref 5–15)
BUN: 13 mg/dL (ref 8–23)
CO2: 27 mmol/L (ref 22–32)
Calcium: 9.2 mg/dL (ref 8.9–10.3)
Chloride: 108 mmol/L (ref 98–111)
Creatinine: 0.99 mg/dL (ref 0.61–1.24)
GFR, Estimated: >60 mL/min (ref >60)
Glucose, Bld: 188 mg/dL — ABNORMAL HIGH (ref 70–99)
Potassium: 4.4 mmol/L (ref 3.5–5.1)
Sodium: 140 mmol/L (ref 135–145)
Total Bilirubin: 0.5 mg/dL (ref 0.0–1.2)
Total Protein: 6.8 g/dL (ref 6.5–8.1)

## 2023-08-21 NOTE — Progress Notes (Signed)
 [facility] at [facility] [address]  [place], [place] [address] [phone]   Interval Evaluation  Date of Service: 08/21/23 Patient Name: Javier Graham Patient MRN: [id number] Patient DOB: 1956/04/05 Provider: Henreitta Leber, MD  Identifying Statement:  Javier Graham is a 68 y.o. male with right parietal glioblastoma    Oncologic History: Oncology History  Glioblastoma, IDH-wildtype (HCC)  07/05/2023 Surgery   Craniotomy, resection at [facility] with Dr. Allena Katz; path is glioblastoma   08/03/2023 -  Chemotherapy   Patient is on Treatment Plan : BRAIN GLIOBLASTOMA Radiation Therapy With Concurrent Temozolomide 75 mg/m2 Daily Followed By Sequential Maintenance Temozolomide x 6-12 cycles       Biomarkers:  MGMT Unknown.  IDH 1/2 Wild type.  EGFR Unknown  TERT Unknown   Interval History: [name] presents today for follow up, now having completed 2 weeks of radiation and Temodar.  No new or progressive changes.  Does describe increased fatigue.  Denies seizures, headaches.    H+P (07/27/23) Patient presents to establish care locally for his new brain tumor.  He and his wife describe issues with driving navigation, some confusion which led to his assessment in the ED.  CNS imaging demonstrated a large enhancing mass within the right parietal lobe, c/w primary brain tumor.  He underwent surgery at [facility] on 2/26 with Dr. Allena Katz; path demonstrated glioblastoma IDHwt.  Following surgery he has noticed some issues with navigating space and finding certain words.  No longer dosing Keppra or decadron.  Medications: Current Outpatient Medications on File Prior to Visit  Medication Sig Dispense Refill   ascorbic acid (VITAMIN C) 500 MG tablet Take 500 mg by mouth daily.     aspirin EC 81 MG tablet Take 81 mg by mouth daily.     Blood Glucose Monitoring Suppl (ACCU-CHEK GUIDE) w/Device KIT USE AS DIRECTED TO CHECK BLOOD GLUCOSE     clopidogrel (PLAVIX) 75 MG  tablet Take 1 tablet (75 mg total) by mouth daily. 30 tablet 11   dapagliflozin propanediol (FARXIGA) 5 MG TABS tablet Take 5 mg by mouth daily. 30 tablet 11   glimepiride (AMARYL) 4 MG tablet Take 1 tablet (4 mg total) by mouth daily before breakfast. 30 tablet 3   lisinopril (ZESTRIL) 5 MG tablet Take 5 mg by mouth daily.     Melatonin 5 MG TABS Take 2 tablets by mouth as needed.     metFORMIN (GLUMETZA) 500 MG (MOD) 24 hr tablet Take 500 mg by mouth. Take 2 tabs twice daily     ondansetron (ZOFRAN) 8 MG tablet Take 1 tablet (8 mg total) by mouth every 8 (eight) hours as needed for nausea or vomiting. May take 30-60 minutes prior to Temodar administration if nausea/vomiting occurs as needed. 30 tablet 1   polyethylene glycol powder (GLYCOLAX/MIRALAX) 17 GM/SCOOP powder Take by mouth daily as needed.     Sennosides (SENOKOT PO) Take 1 tablet by mouth daily as needed.     temozolomide (TEMODAR) 140 MG capsule Take 1 capsule (140 mg total) by mouth daily. (Take with one 20 mg capsule for total daily dose of 160 mg). May take on an empty stomach to decrease nausea & vomiting. 42 capsule 0   temozolomide (TEMODAR) 20 MG capsule Take 1 capsule (20 mg total) by mouth daily. (Take with one 140 mg capsule for total daily dose of 160 mg). May take on an empty stomach to decrease nausea & vomiting. 42 capsule 0  VICTOZA 18 MG/3ML SOPN INJECT 0.3 ML'S (1.8 MG TOTAL) INTO THE SKIN DAILY 9 pen 3   No current facility-administered medications on file prior to visit.    Allergies: No Known Allergies Past Medical History:  Past Medical History:  Diagnosis Date   Diabetes (HCC)    Diplopia    Headache    Past Surgical History:  Past Surgical History:  Procedure Laterality Date   BUNIONECTOMY     Social History:  Social History   Socioeconomic History   Marital status: Married    Spouse name: Not on file   Number of children: 3   Years of education: college   Highest education level: Master's  degree (e.g., MA, MS, MEng, MEd, MSW, MBA)  Occupational History   Not on file  Tobacco Use   Smoking status: Never   Smokeless tobacco: Never  Substance and Sexual Activity   Alcohol use: No    Comment: none since 1996   Drug use: No   Sexual activity: Not on file  Other Topics Concern   Not on file  Social History Narrative   Lives at home with his wife.   Right-handed.   One can of Coke Zero per day.   Social Drivers of Corporate investment banker Strain: High Risk (07/10/2023)   [facility]   Overall Financial Resource Strain (CARDIA)    Difficulty of Paying Living Expenses: Hard  Food Insecurity: No Food Insecurity (07/27/2023)   Hunger Vital Sign    Worried About Running Out of Food in the Last Year: Never true    Ran Out of Food in the Last Year: Never true  Transportation Needs: No Transportation Needs (07/10/2023)   Received from [facility]    In the past 12 months, has lack of transportation kept you from medical appointments or from getting medications?: No    Lack of Transportation (Non-Medical): No  Physical Activity: Not on file  Stress: Not on file  Social Connections: Not on file  Intimate Partner Violence: Not on file   Family History:  Family History  Problem Relation Age of Onset   Heart disease Other    Diabetes Other    Diabetes Father    Stroke Father        x 2   Heart disease Father    Diabetes Sister    Diabetes Brother    Heart disease Mother     Review of Systems: Constitutional: Doesn't report fevers, chills or abnormal weight loss Eyes: Doesn't report blurriness of vision Ears, nose, mouth, throat, and face: Doesn't report sore throat Respiratory: Doesn't report cough, dyspnea or wheezes Cardiovascular: Doesn't report palpitation, chest discomfort  Gastrointestinal:  Doesn't report nausea, constipation, diarrhea GU: Doesn't report incontinence Skin: Doesn't  report skin rashes Neurological: Per HPI Musculoskeletal: Doesn't report joint pain Behavioral/Psych: Doesn't report anxiety  Physical Exam: Vitals:   08/21/23 1043  BP: 127/73  Pulse: 88  Resp: 17  Temp: 98.6 F (37 C)  SpO2: 97%   KPS: 80. General: Alert, cooperative, pleasant, in no acute distress Head: Normal EENT: No conjunctival injection or scleral icterus.  Lungs: Resp effort normal Cardiac: Regular rate Abdomen: Non-distended abdomen Skin: No rashes cyanosis or petechiae. Extremities: No clubbing or edema  Neurologic Exam: Mental Status: Awake, alert, attentive to examiner. Oriented to self and environment. Language is fluent with intact comprehension.  Cranial Nerves: Visual acuity is grossly normal. Visual fields  are full. Extra-ocular movements intact. No ptosis. Face is symmetric Motor: Tone and bulk are normal. Power is full in both arms and legs. Reflexes are symmetric, no pathologic reflexes present.  Sensory: Intact to light touch Gait: Normal.   Labs: I have reviewed the data as listed    Component Value Date/Time   NA 140 08/21/2023 0753   NA 139 12/19/2018 0858   K 4.4 08/21/2023 0753   CL 108 08/21/2023 0753   CO2 27 08/21/2023 0753   GLUCOSE 188 (H) 08/21/2023 0753   BUN 13 08/21/2023 0753   BUN 14 12/19/2018 0858   CREATININE 0.99 08/21/2023 0753   CALCIUM 9.2 08/21/2023 0753   PROT 6.8 08/21/2023 0753   PROT 6.8 12/19/2018 0858   ALBUMIN 4.3 08/21/2023 0753   ALBUMIN 4.6 12/19/2018 0858   AST 17 08/21/2023 0753   ALT 20 08/21/2023 0753   ALKPHOS 66 08/21/2023 0753   BILITOT 0.5 08/21/2023 0753   GFRNONAA >60 08/21/2023 0753   GFRAA 90 12/19/2018 0858   Lab Results  Component Value Date   WBC 7.3 08/21/2023   NEUTROABS 4.7 08/21/2023   HGB 13.1 08/21/2023   HCT 38.2 (L) 08/21/2023   MCV 89.5 08/21/2023   PLT 220 08/21/2023     Assessment/Plan Glioblastoma, IDH-wildtype (HCC)  [name] is clinically stable today, now  having completed 2 weeks of IMRT and concurrent Temodar.  Labs are within normal limits.   We ultimately recommended continuing with course of intensity modulated radiation therapy and concurrent daily Temozolomide.  Radiation will be administered Mon-Fri over 6 weeks, Temodar will be dosed at 75mg /m2 to be given daily over 42 days.  We reviewed side effects of temodar, including fatigue, nausea/vomiting, constipation, and cytopenias.  Informed consent was verbally obtained at bedside to proceed with oral chemotherapy.  Chemotherapy should be held for the following:  ANC less than 1,000  Platelets less than 100,000  LFT or creatinine greater than 2x ULN  If clinical concerns/contraindications develop  Every 2 weeks during radiation, labs will be checked accompanied by a clinical evaluation in the brain tumor clinic.  All questions were answered. The patient knows to call the clinic with any problems, questions or concerns. No barriers to learning were detected.  The total time spent in the encounter was 30 minutes and more than 50% was on counseling and review of test results   Mamie Searles, MD Medical Director of Neuro-Oncology [facility] at [place] 08/21/23 10:54 AM

## 2023-08-22 ENCOUNTER — Ambulatory Visit: Admitting: Rehabilitative and Restorative Service Providers"

## 2023-08-22 ENCOUNTER — Ambulatory Visit
Admission: RE | Admit: 2023-08-22 | Discharge: 2023-08-22 | Disposition: A | Discharge status: Home / Self Care | Source: Ambulatory Visit | Attending: Radiation Oncology | Admitting: Radiation Oncology

## 2023-08-22 ENCOUNTER — Other Ambulatory Visit: Payer: Self-pay

## 2023-08-22 ENCOUNTER — Encounter: Payer: Self-pay | Admitting: Rehabilitative and Restorative Service Providers"

## 2023-08-22 ENCOUNTER — Other Ambulatory Visit (HOSPITAL_COMMUNITY): Payer: Self-pay

## 2023-08-22 ENCOUNTER — Ambulatory Visit: Attending: Physician Assistant | Admitting: Occupational Therapy

## 2023-08-22 DIAGNOSIS — R2681 Unsteadiness on feet: Secondary | ICD-10-CM | POA: Insufficient documentation

## 2023-08-22 DIAGNOSIS — R208 Other disturbances of skin sensation: Secondary | ICD-10-CM | POA: Insufficient documentation

## 2023-08-22 DIAGNOSIS — M6281 Muscle weakness (generalized): Secondary | ICD-10-CM | POA: Diagnosis not present

## 2023-08-22 DIAGNOSIS — C713 Malignant neoplasm of parietal lobe: Secondary | ICD-10-CM | POA: Diagnosis not present

## 2023-08-22 DIAGNOSIS — R2689 Other abnormalities of gait and mobility: Secondary | ICD-10-CM

## 2023-08-22 DIAGNOSIS — R278 Other lack of coordination: Secondary | ICD-10-CM | POA: Insufficient documentation

## 2023-08-22 DIAGNOSIS — R29818 Other symptoms and signs involving the nervous system: Secondary | ICD-10-CM | POA: Diagnosis not present

## 2023-08-22 DIAGNOSIS — R5383 Other fatigue: Secondary | ICD-10-CM | POA: Diagnosis not present

## 2023-08-22 DIAGNOSIS — R41840 Attention and concentration deficit: Secondary | ICD-10-CM | POA: Insufficient documentation

## 2023-08-22 DIAGNOSIS — Z510 Encounter for antineoplastic radiation therapy: Secondary | ICD-10-CM | POA: Diagnosis not present

## 2023-08-22 LAB — RAD ONC ARIA SESSION SUMMARY
Course Elapsed Days: 15
Plan Fractions Treated to Date: 12
Plan Prescribed Dose Per Fraction: 2 Gy
Plan Total Fractions Prescribed: 23
Plan Total Prescribed Dose: 46 Gy
Reference Point Dosage Given to Date: 24 Gy
Reference Point Session Dosage Given: 2 Gy
Session Number: 12

## 2023-08-22 NOTE — Progress Notes (Signed)
 Patient was dispensed full course of therapy at first fill and called for a therapy check-in.  Disenrolling from Specialty Pharmacy services.

## 2023-08-22 NOTE — Therapy (Signed)
 OUTPATIENT OCCUPATIONAL THERAPY NEURO EVALUATION  Patient Name: Javier Graham MRN: [id number] DOB:[birth year], 68 y.o., male Today's Date: 08/22/2023  PCP:  Vallarie Gauze, MD  REFERRING PROVIDER: Rachel Budds, PA  END OF SESSION:  OT End of Session - 08/22/23 1055     Visit Number 1    Number of Visits 13    Date for OT Re-Evaluation 10/06/23    Authorization Type [employer] Medicare - auth submitted    OT Start Time 0848    OT Stop Time 0934    OT Time Calculation (min) 46 min             Past Medical History:  Diagnosis Date   Diabetes (HCC)    Diplopia    Headache    Past Surgical History:  Procedure Laterality Date   BUNIONECTOMY     Patient Active Problem List   Diagnosis Date Noted   Glioblastoma, IDH-wildtype (HCC) 07/27/2023   Diplopia 12/19/2018   Stenosis of right vertebral artery 12/19/2018   Diabetes (HCC) 06/19/2016   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 He was found to have a large R parietal lobe mass that was resected at [facility] on 07/05/23 and pathology is consistent with glioblastoma.   Pt reports balance is definitely an issue.  Pt reports unsure of being able to know what is expected of him and whether or not he will be able to carry it out.  Pt and spouse reports L peripheral vision impairment.  Pt's spouse reports that his memory is challenging, he will frequently put clothing on backwards  when getting dressed.    Pt accompanied by: self and significant other(wife-[name])  PERTINENT HISTORY: diabetes, h/o HA and h/o diplopia   PRECAUTIONS: Fall and Other: s/p surgery did have restriction of no lifting > 15#  WEIGHT BEARING RESTRICTIONS: No  PAIN:  Are you having pain? Yes: NPRS scale: 7/10 Pain location: back Pain description: stiff Aggravating factors: certain movements Relieving factors: movement, stretching  FALLS: Has patient fallen in last 6 months? Yes. Number of falls 1, fell in February and hit his eye went to ED  LIVING ENVIRONMENT: Lives with: lives with their spouse Lives in: House/apartment Stairs: Yes: Internal: bedroom is on main level, but does have a flight of steps to downstairs "rec room" and second floor which he rarely goes up steps; handrail and External: 1 steps in from garage Has following equipment at home: Walker - 4 wheeled, Grab bars, and built in bench in shower that pt has used s/p surgery  PLOF: Independent and Independent with basic ADLs; retired, spends time with grandkids (10 and 61 yo)  PATIENT GOALS: to be able to drive again; to be able to do things with the grandchildren  OBJECTIVE:  Note: Objective measures were completed at Evaluation unless otherwise noted.  HAND DOMINANCE: Right  ADLs: Overall ADLs: spouse reports pt will frequently put clothing on backwards Transfers/ambulation related to ADLs: Eating: pt will occasionally not see items on L during  eating, has broken glasses that were on L side as he did not see them LB Dressing: occasional increased time to problem solve tying shoes Toileting: Mod I Bathing: Mod I Tub Shower transfers: Mod I Equipment: Grab bars and Walk in shower  IADLs: Light housekeeping: he helps make the bed and dries dishes, takes out the garbage independently Meal Prep: Intermittent supervision as pt grilled steaks, pt is not primary cook.   Community mobility: not cleared to drive   Medication management: did not ask due to time constraints  MOBILITY STATUS:  somewhat guarded due to L peripheral vision impairment  POSTURE COMMENTS:  No Significant postural limitations  ACTIVITY TOLERANCE: Activity tolerance: decreased endurance  FUNCTIONAL OUTCOME MEASURES: Physical performance test: not completed this session due to time constraints and pt with decreased awareness of deficits* 9 hole peg test: 9 Hole Peg test: Right: 46.85 sec; Left: 65.87 sec; pt demonstrating decreased recall of instructions and decreased awareness as picking up multiple pegs on L and requiring cues to remove pegs after placement bilaterally   UPPER EXTREMITY ROM:  WFL bilaterally  UPPER EXTREMITY MMT:   grossly 5/5 bilaterally  COORDINATION: 9 Hole Peg test: Right: 46.85 sec; Left: 65.87 sec - pt demonstrating decreased recall of instructions and decreased awareness as picking up multiple pegs on L and requiring cues to remove pegs after placement bilaterally  Rapid alternating movements and FTN are equal and symmetric bilaterally   SENSATION: WFL on eval; however may need to further assess functionally as pt with initial concerns for L sensory impairment on chart review  COGNITION: Overall cognitive status:  impaired, with memory changes  VISION: Subjective report: L neglect   VISION ASSESSMENT: To be further assessed in functional context Tracking/Visual pursuits: Decreased smoothness with horizontal tracking and difficulty to assess due to difficulty with following commands Visual Fields: Left visual field deficits - pt reports seeing movement in L peripheral vision prior to recognition of stimulus at 30*  PERCEPTION: Impaired: Inattention/neglect: does not attend to left visual field  OBSERVATIONS: Pleasant gentleman with supportive wife who is able to provide additional information as pt with decreased awareness of impairments and impact of impairments on safety and  independence.                                                                                                                             TREATMENT DATE:  08/22/23 Educated on parietal lobe function and common deficits present with lesion in parietal lobe.  Education on role and purpose of OT as well as potential interventions and goals for therapy based on initial evaluation findings.    PATIENT EDUCATION: Education details: Educated on role and purpose of OT as well as potential interventions and goals for therapy based on initial evaluation findings. Person educated: Patient and Spouse Education method: Explanation Education comprehension: needs further education  HOME EXERCISE PROGRAM: TBD   GOALS: Goals reviewed with patient? Yes  SHORT TERM GOALS:  Target date: 09/15/23  Pt will be independent in coordination HEP with use of visual handouts. Baseline: decreased coordination bilaterally on 9 hole peg test Goal status: INITIAL  2.  Pt will verbalize understanding with safety considerations and AE needs to ensure safety d/t decreased sensation.  Baseline: diminished sensation per chart review Goal status: INITIAL  3.  Pt will demonstrate understanding of memory compensations and ways to keep thinking skills sharp. Baseline: decreased memory for use of visual compensation Goal status: INITIAL  4.  Pt will verbalize understanding of energy conservation strategies to increase safety with ADLs and IADLs and report 2 in use to increase endurance as needed to complete leisure tasks (to include playing and hiking with grandchildren). Baseline: decreased endurance Goal status: INITIAL  LONG TERM GOALS: Target date: 10/06/23  Pt will verbalize understanding of task modifications and/or potential DME/AE needs to increase ease, safety, and independence w/ ADLs.  Baseline: decreased endurance, donning clothing backwards with decreased awareness Goal status: INITIAL  2.  Pt will  complete table top scanning activity with Supervision. Baseline: L peripheral impairment Goal status: INITIAL  3.  Pt will navigate a moderately busy environment, following multi-step commands with 90% accuracy Baseline: difficulty with dual tasking, recall of instructions/compensatory strategies Goal status: INITIAL  4.  Pt will demonstrate ability to sequence simple functional task/IADL (simple snack prep, laundry task, etc) at Mod I level with good safety awareness Baseline: decreased sequence and recall of instructions/compensatory strategies Goal status: INITIAL   ASSESSMENT:  CLINICAL IMPRESSION: Patient is a 68 y.o. male who was seen today for occupational therapy evaluation for impairments due to glioma, s/p resection and receiving radiation.  Pt and wife describe increased confusion, memory issues, difficulty following commands, and L visual neglect impacting safety with mobility and driving. 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  Pt will benefit from skilled occupational therapy services to address coordination, pain management, altered sensation, balance, GM/FM control, cognition, safety awareness, introduction of compensatory strategies/AE prn, visual-perception, and implementation of an HEP to improve participation and safety during ADLs and IADLs.    PERFORMANCE DEFICITS: in functional skills including ADLs, IADLs, coordination, proprioception, sensation, pain, Fine motor control, Gross motor control, body mechanics, endurance, decreased knowledge of precautions, decreased knowledge of use of DME, vision, and UE functional use, cognitive skills including attention, memory, problem solving, safety awareness, and sequencing, and psychosocial skills including coping strategies, environmental adaptation, interpersonal interactions, and routines and behaviors.   IMPAIRMENTS: are limiting patient  from ADLs, IADLs, and leisure.   CO-MORBIDITIES: may have co-morbidities  that affects occupational performance. Patient will benefit from skilled OT to address above impairments and improve overall function.  MODIFICATION OR ASSISTANCE TO COMPLETE EVALUATION: Min-Moderate modification of tasks or assist with assess necessary to complete an evaluation.  OT OCCUPATIONAL PROFILE AND HISTORY: Detailed assessment: Review of records and additional review of physical, cognitive, psychosocial history related to current functional performance.  CLINICAL DECISION MAKING: Moderate - several treatment options, min-mod task modification necessary  REHAB POTENTIAL: Good  EVALUATION COMPLEXITY: Moderate    PLAN:  OT FREQUENCY: 1-2x/week  OT DURATION: 6 weeks  PLANNED INTERVENTIONS: 97168 OT Re-evaluation, 97535 self care/ADL training, 16109 therapeutic exercise, 97530 therapeutic activity, 97112 neuromuscular re-education, functional mobility training, visual/perceptual remediation/compensation, psychosocial skills training, energy conservation, coping strategies training, patient/family education, and DME and/or AE instructions  RECOMMENDED OTHER SERVICES: may benefit from speech therapy  CONSULTED AND  AGREED WITH PLAN OF CARE: Patient and family member/caregiver  PLAN FOR NEXT SESSION: Complete SLUMS at next session, complete table top vision task, educate on visual compensation   Anthonette Kinsman, OTR/L 08/22/2023, 10:57 AM   [facility] at [facility] [address] [place], [place] [address] Phone # [phone] Fax # [phone]

## 2023-08-22 NOTE — Therapy (Signed)
 OUTPATIENT PHYSICAL THERAPY NEURO EVALUATION   Patient Name: Javier Graham MRN: [id number] DOB:Nov 24, 1955, 68 y.o., male Today's Date: 08/22/2023   PCP: Vallarie Gauze, MD REFERRING PROVIDER: Rheba Cedar, PA  END OF SESSION:  PT End of Session - 08/22/23 0915     Visit Number 1    Number of Visits 16    Date for PT Re-Evaluation 10/21/23    Authorization Type [employer] Medicare    PT Start Time 0932    PT Stop Time 1015    PT Time Calculation (min) 43 min    Activity Tolerance Patient tolerated treatment well    Behavior During Therapy [facility] for tasks assessed/performed            Past Medical History:  Diagnosis Date   Diabetes (HCC)    Diplopia    Headache    Past Surgical History:  Procedure Laterality Date   BUNIONECTOMY     Patient Active Problem List   Diagnosis Date Noted   Glioblastoma, IDH-wildtype (HCC) 07/27/2023   Diplopia 12/19/2018   Stenosis of right vertebral artery 12/19/2018   Diabetes (HCC) 06/19/2016   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   High ankle sprain 10/17/2012   Leg pain 10/17/2012   ONSET DATE: 07/05/23 REFERRING DIAG:  Diagnosis  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 He was found to have a large R parietal lobe mass that was resected at [facility] on 07/05/23 and pathology is consistent with glioblastoma. He notes weakness in his thighs that can feel like "achiness" with extended walking/hiking. Pt accompanied by: family  member  PERTINENT HISTORY: diabetes, h/o HA and h/o diplopia  PAIN:  Are you having pain? Yes; can get HA and low back pain. Also notes an achiness in thighs when beginning activities that is new with current medical issues.  PRECAUTIONS: Fall  WEIGHT BEARING RESTRICTIONS: No  FALLS: Has patient fallen in last 6 months? Yes. Number of falls 1  one fall while getting out of bed in the middle of the night  LIVING ENVIRONMENT: Lives with: lives with their family Lives in: House/apartment Stairs: Yes: Internal: 12-14 steps to top floor  and to basement, able to live on main floor steps; handrail Has following equipment at home: None  PLOF: Independent spends time with grandkids  PATIENT GOALS: I want to be able to go out and hike with grandkids without limitations (82 and 92 years old)  OBJECTIVE:  __________________________________________________________________________________________________________ Note: Objective measures were completed at Evaluation unless otherwise noted.  DIAGNOSTIC FINDINGS: reviewed in chart  COGNITION: Overall cognitive status:  impaired with memory changes   SENSATION: WFL  COORDINATION: Rapid alternating movements, FTN, and HTS are equal and symmetric bilaterally  MUSCLE TONE: WNLs  POSTURE: No Significant postural limitations  LOWER EXTREMITY ROM:    WFLs per observation t/o evaluation  LOWER EXTREMITY MMT:  WFLs for all activities demonstrating functional squat to the ground and return to stand.  BED MOBILITY:  Findings: independent  TRANSFERS: Sit to stand: Complete Independence  Assistive device utilized: None      STAIRS: Patient requires CGA due to catching his toes on the edge of steps x 4 steps  GAIT: Device: None Assist: Patient needs supervision due to visual deficits with L visual field cut and possible inattention/neglect. Characteristics:  Gait is characterized by slower speeds, mostly due to navigating obstacles in  clinic Gait speed 2.44 ft/sec  FUNCTIONAL TESTS:  Berg=50/56 FGA=22/30   [facility] PT Assessment - 08/22/23 0921       Standardized Balance Assessment   Standardized Balance Assessment Berg Balance Test      Berg Balance Test   Sit to Stand Able to stand without using hands and stabilize independently    Standing Unsupported Able to stand safely 2 minutes    Sitting with Back Unsupported but Feet Supported on Floor or Stool Able to sit safely and securely 2 minutes    Stand to Sit Sits safely with minimal use of hands    Transfers Able to transfer safely, minor use of hands    Standing Unsupported with Eyes Closed Able to stand 10 seconds safely    Standing Unsupported with Feet Together Able to place feet together independently and stand 1 minute safely    From Standing, Reach Forward with Outstretched Arm Can reach confidently >25 cm (10")    From Standing Position, Pick up Object from Floor Able to pick up shoe safely and easily    From Standing Position, Turn to Look Behind Over each Shoulder Looks behind from both sides and weight shifts well    Turn 360 Degrees Able to turn 360 degrees safely but slowly   slow to both sides   Standing Unsupported, Alternately Place Feet on Step/Stool Able to stand independently and complete 8 steps >20 seconds    Standing Unsupported, One Foot in Front Able to plae foot ahead of the other independently and hold 30 seconds    Standing on One Leg Able to lift leg independently and hold equal to or more than 3 seconds    Total Score 50    Berg comment: 50/56      Functional Gait  Assessment   Gait assessed  Yes    Gait Level Surface Walks 20 ft in less than 5.5 sec, no assistive devices, good speed, no evidence for imbalance, normal gait pattern, deviates no more than 6 in outside of the 12 in walkway width.    Change in Gait Speed Able to smoothly change walking speed without loss of balance or gait deviation. Deviate no more than 6 in outside of the  12 in walkway width.  Gait with Horizontal Head Turns Performs head turns smoothly with no change in gait. Deviates no more than 6 in outside 12 in walkway width    Gait with Vertical Head Turns Performs task with slight change in gait velocity (eg, minor disruption to smooth gait path), deviates 6 - 10 in outside 12 in walkway width or uses assistive device    Gait and Pivot Turn Pivot turns safely in greater than 3 sec and stops with no loss of balance, or pivot turns safely within 3 sec and stops with mild imbalance, requires small steps to catch balance.    Step Over Obstacle Is able to step over one shoe box (4.5 in total height) but must slow down and adjust steps to clear box safely. May require verbal cueing.    Gait with Narrow Base of Support Is able to ambulate for 10 steps heel to toe with no staggering.    Gait with Eyes Closed Walks 20 ft, uses assistive device, slower speed, mild gait deviations, deviates 6-10 in outside 12 in walkway width. Ambulates 20 ft in less than 9 sec but greater than 7 sec.    Ambulating Backwards Walks 20 ft, uses assistive device, slower speed, mild gait deviations, deviates 6-10 in outside 12 in walkway width.    Steps Two feet to a stair, must use rail.    Total Score 22    FGA comment: 22/30            PATIENT SURVEYS:  Planned to use patient specific activity score, however, due to memory and awareness did not ask.                                                                                                                              [facility] Adult PT Treatment:                                                DATE: 08/22/23 Self Care: PT spent time discussing current plan with patient and his wife recommending continued focus on home walking program. We discussed home safety emphasizing for hikes where R sided drop off patient's wife to walk on R Discussed potential options for visual scanning, however patient notes he feels memory may limit his  carryover after scanning a room to be aware of L sided obstacles.  PATIENT EDUCATION: Education details: Plan of care for PT, plan for upcoming HEP, and continued walking with family discussing only going with supervision due to visual deficits Person educated: Patient and Spouse Education method: Explanation, Demonstration, and Handouts Education comprehension: verbalized understanding, returned demonstration, and needs further education  HOME EXERCISE PROGRAM: To be established at next session __________________________________________________________________________________________________________ GOALS: Goals reviewed with patient? Yes  SHORT TERM GOALS: Target date: 09/21/23  The patient will perform HEP with cues/reminders from  spouse. Baseline: initiated at eval Goal status: INITIAL  2.  The patient will maintain single limb standing x 10 seconds R and L sides.  Baseline:  Holds 3-4 seconds on each LE Goal status: INITIAL  LONG TERM GOALS: Target date: 10/06/23  The patient will perform progression of HEP with assist from family as needed. Baseline:  initiated at eval Goal status: INITIAL  2.  The patient will improve FGA to > or equal to 24/30. Baseline: 22/30 *anticipate that visual deficits will lead to continued need for supervision during some tasks Goal status: INITIAL  3.  The patient will report resolution of thigh discomfort with extended ambulation > 30 minutes. Baseline:  notes fatigue and achiness in bilateral quads Goal status: INITIAL  4.  The patient will demonstrate floor<>stand due to h/o 1 fall to ensure ability to safety stand with UE support through floor. Baseline:  due to h/o fall Goal status: INITIAL  ASSESSMENT:  CLINICAL IMPRESSION: Patient is a 67 y.o. male who was seen today for physical therapy evaluation and treatment s/p glioblastoma resection from R parietal lobe on 07/05/23. He presents to OP physical therapy with impairments of vision  that are impacting gait and mobility. He presents with good strength and balance (Berg score 50/56) except for in single limb standing. Turns are limited mostly due to vision per observation and this impacts performance on functional gait assessment as well. PT discussed some strategies for scanning rooms to reduce hitting things on the L side-- patient expresses decreased memory which would impact his ability to remember to scan as well as maintain memory of obstacles in a room. This shows some awareness of deficits and we discussed working with his family to ensure safety during outdoor tasks.   OBJECTIVE IMPAIRMENTS: Abnormal gait, decreased balance, decreased safety awareness, impaired vision/preception, and pain.   ACTIVITY LIMITATIONS: locomotion level  PARTICIPATION LIMITATIONS: community activity and yard work  PERSONAL FACTORS: 1-2 comorbidities: diabetes, glioblastoma  are also affecting patient's functional outcome.   REHAB POTENTIAL: Fair due to nature of condition  CLINICAL DECISION MAKING: Evolving/moderate complexity  EVALUATION COMPLEXITY: Moderate  PLAN:  PT FREQUENCY: 2x/week  PT DURATION: 8 weeks  PLANNED INTERVENTIONS: 97164- PT Re-evaluation, 97110-Therapeutic exercises, 97530- Therapeutic activity, 97112- Neuromuscular re-education, 97535- Self Care, 14782- Manual therapy, [phone]- Gait training, [phone]- Aquatic Therapy, Patient/Family education, Balance training, Stair training, and Visual/preceptual remediation/compensation  PLAN FOR NEXT SESSION: Add HEP considering single limb stance near support on R side, standing with head motions or controlled 1/2 turns to work on repetition for scanning environment due to visual deficits, discuss continued outdoor walking with supervision, and trial coordination activities for clearing obstacles and determine if safe for home. Focus on foot clearance near obstacles, scanning environment in therapy with cards/visual cues. Engage  spouse with HEP due to potentially limited carryover.   Eulene Pekar, PT 08/22/2023, 1:23 PM

## 2023-08-22 NOTE — Progress Notes (Signed)
 Specialty Pharmacy Ongoing Clinical Assessment Note  Javier Graham is a 68 y.o. male who is being followed by the specialty pharmacy service for RxSp Oncology   Patient's specialty medication(s) reviewed today: Temozolomide (TEMODAR)   Missed doses in the last 4 weeks: 1 (fell asleep)   Patient/Caregiver did not have any additional questions or concerns.   Therapeutic benefit summary: Unable to assess   Adverse events/side effects summary: No adverse events/side effects   Patient's therapy is appropriate to: Continue    Goals Addressed             This Visit's Progress    Slow Disease Progression   No change    Patient is initiating therapy. Patient will maintain adherence.         Follow up: no follow up needed, pt was on 42 days of therapy only, recommended that he call if any questions or concerns arise.   Malachi Screws Specialty Pharmacist

## 2023-08-23 ENCOUNTER — Ambulatory Visit
Admission: RE | Admit: 2023-08-23 | Discharge: 2023-08-23 | Disposition: A | Discharge status: Home / Self Care | Source: Ambulatory Visit | Attending: Radiation Oncology | Admitting: Radiation Oncology

## 2023-08-23 ENCOUNTER — Other Ambulatory Visit: Payer: Self-pay

## 2023-08-23 DIAGNOSIS — R5383 Other fatigue: Secondary | ICD-10-CM | POA: Diagnosis not present

## 2023-08-23 DIAGNOSIS — C713 Malignant neoplasm of parietal lobe: Secondary | ICD-10-CM | POA: Diagnosis not present

## 2023-08-23 DIAGNOSIS — Z510 Encounter for antineoplastic radiation therapy: Secondary | ICD-10-CM | POA: Diagnosis not present

## 2023-08-23 LAB — RAD ONC ARIA SESSION SUMMARY
Course Elapsed Days: 16
Plan Fractions Treated to Date: 13
Plan Prescribed Dose Per Fraction: 2 Gy
Plan Total Fractions Prescribed: 23
Plan Total Prescribed Dose: 46 Gy
Reference Point Dosage Given to Date: 26 Gy
Reference Point Session Dosage Given: 2 Gy
Session Number: 13

## 2023-08-24 ENCOUNTER — Other Ambulatory Visit: Payer: Self-pay

## 2023-08-24 ENCOUNTER — Ambulatory Visit
Admission: RE | Admit: 2023-08-24 | Discharge: 2023-08-24 | Disposition: A | Discharge status: Home / Self Care | Source: Ambulatory Visit | Attending: Radiation Oncology | Admitting: Radiation Oncology

## 2023-08-24 DIAGNOSIS — R5383 Other fatigue: Secondary | ICD-10-CM | POA: Diagnosis not present

## 2023-08-24 DIAGNOSIS — C713 Malignant neoplasm of parietal lobe: Secondary | ICD-10-CM | POA: Diagnosis not present

## 2023-08-24 DIAGNOSIS — Z510 Encounter for antineoplastic radiation therapy: Secondary | ICD-10-CM | POA: Diagnosis not present

## 2023-08-24 LAB — RAD ONC ARIA SESSION SUMMARY
Course Elapsed Days: 17
Plan Fractions Treated to Date: 14
Plan Prescribed Dose Per Fraction: 2 Gy
Plan Total Fractions Prescribed: 23
Plan Total Prescribed Dose: 46 Gy
Reference Point Dosage Given to Date: 28 Gy
Reference Point Session Dosage Given: 2 Gy
Session Number: 14

## 2023-08-25 ENCOUNTER — Ambulatory Visit
Admission: RE | Admit: 2023-08-25 | Discharge: 2023-08-25 | Disposition: A | Discharge status: Home / Self Care | Source: Ambulatory Visit | Attending: Radiation Oncology | Admitting: Radiation Oncology

## 2023-08-25 ENCOUNTER — Other Ambulatory Visit: Payer: Self-pay

## 2023-08-25 DIAGNOSIS — Z510 Encounter for antineoplastic radiation therapy: Secondary | ICD-10-CM | POA: Diagnosis not present

## 2023-08-25 DIAGNOSIS — R5383 Other fatigue: Secondary | ICD-10-CM | POA: Diagnosis not present

## 2023-08-25 DIAGNOSIS — C713 Malignant neoplasm of parietal lobe: Secondary | ICD-10-CM | POA: Diagnosis not present

## 2023-08-25 LAB — RAD ONC ARIA SESSION SUMMARY
Course Elapsed Days: 18
Plan Fractions Treated to Date: 15
Plan Prescribed Dose Per Fraction: 2 Gy
Plan Total Fractions Prescribed: 23
Plan Total Prescribed Dose: 46 Gy
Reference Point Dosage Given to Date: 30 Gy
Reference Point Session Dosage Given: 2 Gy
Session Number: 15

## 2023-08-28 ENCOUNTER — Ambulatory Visit
Admission: RE | Admit: 2023-08-28 | Discharge: 2023-08-28 | Disposition: A | Discharge status: Home / Self Care | Source: Ambulatory Visit | Attending: Radiation Oncology | Admitting: Radiation Oncology

## 2023-08-28 ENCOUNTER — Other Ambulatory Visit: Payer: Self-pay

## 2023-08-28 DIAGNOSIS — R5383 Other fatigue: Secondary | ICD-10-CM | POA: Diagnosis not present

## 2023-08-28 DIAGNOSIS — C713 Malignant neoplasm of parietal lobe: Secondary | ICD-10-CM | POA: Diagnosis not present

## 2023-08-28 DIAGNOSIS — Z510 Encounter for antineoplastic radiation therapy: Secondary | ICD-10-CM | POA: Diagnosis not present

## 2023-08-28 LAB — RAD ONC ARIA SESSION SUMMARY
Course Elapsed Days: 21
Plan Fractions Treated to Date: 16
Plan Prescribed Dose Per Fraction: 2 Gy
Plan Total Fractions Prescribed: 23
Plan Total Prescribed Dose: 46 Gy
Reference Point Dosage Given to Date: 32 Gy
Reference Point Session Dosage Given: 2 Gy
Session Number: 16

## 2023-08-29 ENCOUNTER — Other Ambulatory Visit: Payer: Self-pay

## 2023-08-29 ENCOUNTER — Ambulatory Visit
Admission: RE | Admit: 2023-08-29 | Discharge: 2023-08-29 | Disposition: A | Discharge status: Home / Self Care | Source: Ambulatory Visit | Attending: Radiation Oncology | Admitting: Radiation Oncology

## 2023-08-29 DIAGNOSIS — C713 Malignant neoplasm of parietal lobe: Secondary | ICD-10-CM | POA: Diagnosis not present

## 2023-08-29 DIAGNOSIS — R5383 Other fatigue: Secondary | ICD-10-CM | POA: Diagnosis not present

## 2023-08-29 DIAGNOSIS — Z510 Encounter for antineoplastic radiation therapy: Secondary | ICD-10-CM | POA: Diagnosis not present

## 2023-08-29 LAB — RAD ONC ARIA SESSION SUMMARY
Course Elapsed Days: 22
Plan Fractions Treated to Date: 17
Plan Prescribed Dose Per Fraction: 2 Gy
Plan Total Fractions Prescribed: 23
Plan Total Prescribed Dose: 46 Gy
Reference Point Dosage Given to Date: 34 Gy
Reference Point Session Dosage Given: 2 Gy
Session Number: 17

## 2023-08-30 ENCOUNTER — Ambulatory Visit

## 2023-08-30 ENCOUNTER — Ambulatory Visit: Admitting: Occupational Therapy

## 2023-08-30 ENCOUNTER — Other Ambulatory Visit: Payer: Self-pay

## 2023-08-30 ENCOUNTER — Ambulatory Visit
Admission: RE | Admit: 2023-08-30 | Discharge: 2023-08-30 | Disposition: A | Discharge status: Home / Self Care | Source: Ambulatory Visit | Attending: Radiation Oncology | Admitting: Radiation Oncology

## 2023-08-30 DIAGNOSIS — C713 Malignant neoplasm of parietal lobe: Secondary | ICD-10-CM | POA: Diagnosis not present

## 2023-08-30 DIAGNOSIS — R208 Other disturbances of skin sensation: Secondary | ICD-10-CM

## 2023-08-30 DIAGNOSIS — R2689 Other abnormalities of gait and mobility: Secondary | ICD-10-CM

## 2023-08-30 DIAGNOSIS — Z510 Encounter for antineoplastic radiation therapy: Secondary | ICD-10-CM | POA: Diagnosis not present

## 2023-08-30 DIAGNOSIS — M6281 Muscle weakness (generalized): Secondary | ICD-10-CM

## 2023-08-30 DIAGNOSIS — R278 Other lack of coordination: Secondary | ICD-10-CM | POA: Diagnosis not present

## 2023-08-30 DIAGNOSIS — R5383 Other fatigue: Secondary | ICD-10-CM | POA: Diagnosis not present

## 2023-08-30 DIAGNOSIS — R29818 Other symptoms and signs involving the nervous system: Secondary | ICD-10-CM

## 2023-08-30 DIAGNOSIS — R2681 Unsteadiness on feet: Secondary | ICD-10-CM | POA: Diagnosis not present

## 2023-08-30 DIAGNOSIS — R41840 Attention and concentration deficit: Secondary | ICD-10-CM

## 2023-08-30 LAB — RAD ONC ARIA SESSION SUMMARY
Course Elapsed Days: 23
Plan Fractions Treated to Date: 18
Plan Prescribed Dose Per Fraction: 2 Gy
Plan Total Fractions Prescribed: 23
Plan Total Prescribed Dose: 46 Gy
Reference Point Dosage Given to Date: 36 Gy
Reference Point Session Dosage Given: 2 Gy
Session Number: 18

## 2023-08-30 NOTE — Therapy (Unsigned)
 OUTPATIENT OCCUPATIONAL THERAPY NEURO Treatment  Patient Name: Javier Graham MRN: [id number] DOB:21-Sep-1955, 68 y.o., male Today's Date: 08/31/2023  PCP:  Vallarie Gauze, MD  REFERRING PROVIDER: Rachel Budds, PA  END OF SESSION:  OT End of Session - 08/30/23 1645     Visit Number 2    Number of Visits 13    Date for OT Re-Evaluation 10/06/23    Authorization Type [employer] - [employer] submitted    OT Start Time 0930    OT Stop Time 1012    OT Time Calculation (min) 42 min    Activity Tolerance Patient tolerated treatment well    Behavior During Therapy WFL for tasks assessed/performed              Past Medical History:  Diagnosis Date   Diabetes (HCC)    Diplopia    Headache    Past Surgical History:  Procedure Laterality Date   BUNIONECTOMY     Patient Active Problem List   Diagnosis Date Noted   Glioblastoma, IDH-wildtype (HCC) 07/27/2023   Diplopia 12/19/2018   Stenosis of right vertebral artery 12/19/2018   Diabetes (HCC) 06/19/2016   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    From eval: The patient presented to ED due to difficulties with driving navigation and confusion. He was found to have a large R parietal lobe mass that was resected at [facility] on 07/05/23 and pathology is consistent with glioblastoma.   Pt accompanied by: self and significant other(wife-[name] in waiting room)  PERTINENT HISTORY: diabetes, h/o HA and h/o diplopia    PRECAUTIONS: Fall and Other: s/p surgery did have restriction of no lifting > 15# , L visual field cut  WEIGHT BEARING RESTRICTIONS: No  PAIN:  Are you having pain? No  FALLS: Has patient fallen in last 6 months? Yes. Number of falls 1, fell in February and hit his eye went to ED  LIVING ENVIRONMENT: Lives with: lives with their spouse Lives in: House/apartment Stairs: Yes: Internal: bedroom is on main level, but does have a flight of steps to downstairs "rec room" and second floor which he rarely goes up steps; handrail and External: 1 steps in from garage Has following equipment at home: Walker - 4 wheeled, Grab bars, and built in bench in shower that pt has used s/p surgery  PLOF: Independent and Independent with basic ADLs; retired, spends time with grandkids (10 and 47 yo)  PATIENT GOALS: to be able to drive again; to be able to do things with the grandchildren  OBJECTIVE:  Note: Objective measures were completed at Evaluation unless otherwise noted.  HAND DOMINANCE: Right  ADLs: Overall ADLs: spouse reports pt will frequently put clothing on backwards Transfers/ambulation related to ADLs: Eating: pt will occasionally not see items on L during eating, has broken glasses that were on L side as he did not see them LB Dressing: occasional increased time to problem solve tying shoes Toileting: Mod I Bathing: Mod I Tub Shower transfers: Mod I Equipment: Grab bars and Walk  in shower  IADLs: Light housekeeping: he helps make the bed and dries dishes, takes out the garbage independently Meal Prep: Intermittent supervision as pt grilled steaks, pt is not primary cook.   Community mobility: not cleared to drive  Medication management: did not ask due to time constraints  MOBILITY STATUS:  somewhat guarded due to L peripheral vision impairment  POSTURE COMMENTS:  No Significant postural limitations  ACTIVITY TOLERANCE: Activity tolerance: decreased endurance  FUNCTIONAL  OUTCOME MEASURES: Physical performance test: not completed this session due to time constraints and pt with decreased awareness of deficits* 9 hole peg test: 9 Hole Peg test: Right: 46.85 sec; Left: 65.87 sec; pt demonstrating decreased recall of instructions and decreased awareness as picking up multiple pegs on L and requiring cues to remove pegs after placement bilaterally   UPPER EXTREMITY ROM:  WFL bilaterally  UPPER EXTREMITY MMT:   grossly 5/5 bilaterally  COORDINATION: 9 Hole Peg test: Right: 46.85 sec; Left: 65.87 sec - pt demonstrating decreased recall of instructions and decreased awareness as picking up multiple pegs on L and requiring cues to remove pegs after placement bilaterally  Rapid alternating movements and FTN are equal and symmetric bilaterally   SENSATION: WFL on eval; however may need to further assess functionally as pt with initial concerns for L sensory impairment on chart review  COGNITION: Overall cognitive status:  impaired, with memory changes  VISION: Subjective report: L neglect   VISION ASSESSMENT: To be further assessed in functional context Tracking/Visual pursuits: Decreased smoothness with horizontal tracking and difficulty to assess due to difficulty with following commands Visual Fields: Left visual field deficits - pt reports seeing movement in L peripheral vision prior to recognition of stimulus at 30*  PERCEPTION: Impaired: Inattention/neglect: does not attend to left visual field  OBSERVATIONS: Pleasant gentleman with supportive wife who is able to provide additional information as pt with decreased awareness of impairments and impact of impairments on safety and independence.                                                                                                                             TREATMENT DATE:   Self-Care OT educated pt on Vision compensation strategies and safety. Handout provided, see pt instructions. Pt  acknowledged understanding.   TherAct Visual scanning task with Ambulation, CGA to SBA with gait belt - to improve understanding of visual compensation strategies, to reduce fall risk and improve safety, for multi-tasking. - OT recommended to pt to identify a boundary point on R side of body to use as anchor point when walking to avoid "veering" to L side (e.g. R edge of sidewalk). Pt required v/c and assistance to avoid x2 obstacles on L side and to scan intermittently while also maintaining attention to visual "anchor" on R side.   Golf solitaire - to improve visual scanning, to improve understanding of visual scanning strategies, to improve L side attention, for sequencing and cognition. Pt benefited  from mod to max v/c d/t difficulty with cognitive components of task. Per pt request, task was stopped. OT graded task down to organization of cards.  Organization of cards by suit - to improve visual scanning, for sequencing, for pattern recognition. Pt completed task with min to mod v/c for recall of purpose of task.   PATIENT EDUCATION: Education details: see today's tx above Person educated: Patient and Spouse Education method: Explanation Education comprehension: needs further education  HOME EXERCISE PROGRAM: 08/30/23 - visual scanning (see pt instructions)   GOALS: Goals reviewed with patient? Yes  SHORT TERM GOALS: Target date: 09/15/23  Pt will be independent in coordination HEP with use of visual handouts. Baseline: decreased coordination bilaterally on 9 hole peg test Goal status: INITIAL  2.  Pt will verbalize understanding with safety considerations and AE needs to ensure safety d/t decreased sensation.  Baseline: diminished sensation per chart review Goal status: INITIAL  3.  Pt will demonstrate understanding of memory compensations and ways to keep thinking skills sharp. Baseline: decreased memory for use of visual compensation Goal status: INITIAL  4.  Pt will  verbalize understanding of energy conservation strategies to increase safety with ADLs and IADLs and report 2 in use to increase endurance as needed to complete leisure tasks (to include playing and hiking with grandchildren). Baseline: decreased endurance Goal status: INITIAL  LONG TERM GOALS: Target date: 10/06/23  Pt will verbalize understanding of task modifications and/or potential DME/AE needs to increase ease, safety, and independence w/ ADLs.  Baseline: decreased endurance, donning clothing backwards with decreased awareness Goal status: INITIAL  2.  Pt will complete table top scanning activity with Supervision. Baseline: L peripheral impairment Goal status: INITIAL  3.  Pt will navigate a moderately busy environment, following multi-step commands with 90% accuracy Baseline: difficulty with dual tasking, recall of instructions/compensatory strategies Goal status: INITIAL  4.  Pt will demonstrate ability to sequence simple functional task/IADL (simple snack prep, laundry task, etc) at Mod I level with good safety awareness Baseline: decreased sequence and recall of instructions/compensatory strategies Goal status: INITIAL   ASSESSMENT:  CLINICAL IMPRESSION: Pt tolerated tasks fairly well though demo'd increased difficulty with cognitive components of task as evidenced by increased cueing. Pt politely requested preference for tasks other than card games today possibly d/t frustration and recognition of cognitive deficits. Pt would continue to benefit from review of visual scanning strategies during functional tasks to improve safety and attention to affected side. Recommended to continue to incorporate cognitive and multi-tasking components of tasks. Pt will benefit from skilled occupational therapy services to address coordination, pain management, altered sensation, balance, GM/FM control, cognition, safety awareness, introduction of compensatory strategies/AE prn, visual-perception,  and implementation of an HEP to improve participation and safety during ADLs and IADLs.    PERFORMANCE DEFICITS: in functional skills including ADLs, IADLs, coordination, proprioception, sensation, pain, Fine motor control, Gross motor control, body mechanics, endurance, decreased knowledge of precautions, decreased knowledge of use of DME, vision, and UE functional use, cognitive skills including attention, memory, problem solving, safety awareness, and sequencing, and psychosocial skills including coping strategies, environmental adaptation, interpersonal interactions, and routines and behaviors.   IMPAIRMENTS: are limiting patient from ADLs, IADLs, and leisure.   CO-MORBIDITIES: may have co-morbidities  that affects occupational performance. Patient will benefit from skilled OT to address above impairments and improve overall function.  MODIFICATION OR ASSISTANCE TO COMPLETE EVALUATION: Min-Moderate modification of tasks or assist with assess necessary to complete an evaluation.  OT OCCUPATIONAL PROFILE  AND HISTORY: Detailed assessment: Review of records and additional review of physical, cognitive, psychosocial history related to current functional performance.  CLINICAL DECISION MAKING: Moderate - several treatment options, min-mod task modification necessary  REHAB POTENTIAL: Good  EVALUATION COMPLEXITY: Moderate    PLAN:  OT FREQUENCY: 1-2x/week  OT DURATION: 6 weeks  PLANNED INTERVENTIONS: 97168 OT Re-evaluation, 97535 self care/ADL training, 16109 therapeutic exercise, 97530 therapeutic activity, 97112 neuromuscular re-education, functional mobility training, visual/perceptual remediation/compensation, psychosocial skills training, energy conservation, coping strategies training, patient/family education, and DME and/or AE instructions  RECOMMENDED OTHER SERVICES: may benefit from speech therapy  CONSULTED AND AGREED WITH PLAN OF CARE: Patient and family  member/caregiver  PLAN FOR NEXT SESSION:  Complete SLUMS at next session complete table top vision task - avoid card games at upcoming sessions per pt request, instead try e.g. puzzles or word cancellation activity Continue to educate on visual compensation during functional tasks   Oakley Bellman, OTR/L 08/31/2023, 8:26 AM   [facility] [address], [address] [place], [place] [address] Phone # [phone] Fax # [phone]

## 2023-08-30 NOTE — Therapy (Signed)
 OUTPATIENT PHYSICAL THERAPY NEURO TREATMENT   Patient Name: Javier Graham MRN: [id number] DOB:03-Jul-1955, 68 y.o., male Today's Date: 08/30/2023   PCP: Vallarie Gauze, MD REFERRING PROVIDER: Rheba Cedar, PA  END OF SESSION:  PT End of Session - 08/30/23 0842     Visit Number 2    Number of Visits 16    Date for PT Re-Evaluation 10/06/23    Authorization Type [employer] Medicare    PT Start Time 0845    PT Stop Time 0930    PT Time Calculation (min) 45 min    Activity Tolerance Patient tolerated treatment well    Behavior During Therapy [facility] for tasks assessed/performed            Past Medical History:  Diagnosis Date   Diabetes (HCC)    Diplopia    Headache    Past Surgical History:  Procedure Laterality Date   BUNIONECTOMY     Patient Active Problem List   Diagnosis Date Noted   Glioblastoma, IDH-wildtype (HCC) 07/27/2023   Diplopia 12/19/2018   Stenosis of right vertebral artery 12/19/2018   Diabetes (HCC) 06/19/2016   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   High ankle sprain 10/17/2012   Leg pain 10/17/2012   ONSET DATE: 07/05/23 REFERRING DIAG:  Diagnosis  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  Pt accompanied by: family member  PERTINENT HISTORY: diabetes, h/o HA and h/o diplopia  PAIN:  Are you having pain? Yes; can get HA and low back  pain. Also notes an achiness in thighs when beginning activities that is new with current medical issues.  PRECAUTIONS: Fall  WEIGHT BEARING RESTRICTIONS: No  FALLS: Has patient fallen in last 6 months? Yes. Number of falls 1  one fall while getting out of bed in the middle of the night  LIVING ENVIRONMENT: Lives with: lives with their family Lives in: House/apartment Stairs: Yes: Internal: 12-14 steps to top floor and to basement, able to live on main floor steps; handrail Has following equipment at home: None  PLOF: Independent spends time with grandkids  PATIENT GOALS: I want to be able to go out and hike with grandkids without limitations (18 and 80 years old)  OBJECTIVE:   TODAY'S TREATMENT: 08/30/23 Activity Comments  Gross assessment for visual field Seems to have 90 deg left visual cut/inattention. Inferior field also limited  C/o thigh spasm/cramping when walking 30-45 min Assessed quads/hamstrings for spasticity, none present/obvious  Treadmill x 5 min at 2.0 mph Poor-fair balance on treadmill especially when turning head to attend to conversation  Sit to stand w/ cross-body alternate knee taps 2x10 Cues for safety and reaching for arm rests  Seated reaching to side and across body 2x10 Cues for attending to hand/object when crossing midline--better with bright colored object to hand          PATIENT EDUCATION: Education details: HEP initiation, bungee leader for walking with spouse Person educated: Patient and Spouse Education method: Explanation, Facilities manager, and Handouts Education comprehension: verbalized understanding, returned demonstration, and needs further education  HOME EXERCISE PROGRAM: Access Code: [email or web address] URL: [email or web address] Date: 08/30/2023 Prepared by: Kelly Tydarius Yawn  Program Notes sit to stand with cross-body alternate knee taps 3x10   Exercises - Seated Reaching to Side and Across Body  - 1 x daily - 7 x weekly - 3 sets -  10 reps __________________________________________________________________________________________________________ Note: Objective measures were completed at Evaluation unless otherwise noted.  DIAGNOSTIC FINDINGS: reviewed in chart  COGNITION: Overall cognitive status:  impaired with memory changes   SENSATION: WFL  COORDINATION: Rapid alternating movements, FTN, and HTS are equal and symmetric bilaterally  MUSCLE TONE: WNLs  POSTURE: No Significant postural limitations  LOWER EXTREMITY ROM:    WFLs per observation t/o evaluation  LOWER EXTREMITY MMT:  WFLs for all activities demonstrating functional squat to the ground and return to stand.  BED MOBILITY:  Findings: independent  TRANSFERS: Sit to stand: Complete Independence  Assistive device utilized: None      STAIRS: Patient requires CGA due to catching his toes on the edge of steps x 4 steps  GAIT: Device: None Assist: Patient needs supervision due to visual deficits with L visual field cut and possible inattention/neglect. Characteristics:  Gait is characterized by slower speeds, mostly due to navigating obstacles in clinic Gait speed 2.44 ft/sec  FUNCTIONAL TESTS:  Berg=50/56 FGA=22/30    PATIENT SURVEYS:  Planned to use patient specific activity score, however, due to memory and awareness did not ask.    __________________________________________________________________________________________________________ GOALS: Goals reviewed with patient? Yes  SHORT TERM GOALS: Target date: 09/21/23  The patient will perform HEP with cues/reminders from spouse. Baseline: initiated at eval Goal status: INITIAL  2.  The patient will maintain single limb standing x 10 seconds R and L sides.  Baseline:  Holds 3-4 seconds on each LE Goal status: INITIAL  LONG TERM GOALS: Target date: 10/06/23  The patient will perform progression of HEP with assist from family as needed. Baseline:  initiated at eval Goal  status: INITIAL  2.  The patient will improve FGA to > or equal to 24/30. Baseline: 22/30 *anticipate that visual deficits will lead to continued need for supervision during some tasks Goal status: INITIAL  3.  The patient will report resolution of thigh discomfort with extended ambulation > 30 minutes. Baseline:  notes fatigue and achiness in bilateral quads Goal status: INITIAL  4.  The patient will demonstrate floor<>stand due to h/o 1 fall to ensure ability to safety stand with UE support through floor. Baseline:  due to h/o fall Goal status: INITIAL  ASSESSMENT:  CLINICAL IMPRESSION: Quick gross screening for visual field impairment with seemingly 90ish degree peripheral field cut/inattention to left and 50-75 deg inferior field left side.  No spasticity appreciated to bilateral quads/hamstrings.  Pt reports experiencing thigh cramping/locking up when walking excess of 35-45 minutes and following questioning notes this occurs with stationary cycling as well.  Attempted ambulation on treadmill to reproduce but is very imbalanced/unsafe on treadmill at this time.  Instructed in activities promoting coordination and forced attention with crossing of midline.  Initially had profound difficulty with attending to task/left side with improved performance following use of bright target in his hand for focus and cue of "head follows hand".  Provided instruction and reinforcement to spouse to aid in carryover.  Continued sessions to progress POC details to improve safety and functional independence.    OBJECTIVE IMPAIRMENTS: Abnormal gait, decreased balance, decreased safety awareness, impaired vision/preception, and pain.   ACTIVITY LIMITATIONS: locomotion level  PARTICIPATION LIMITATIONS: community activity and yard work  PERSONAL FACTORS: 1-2 comorbidities: diabetes, glioblastoma  are also affecting patient's functional outcome.   REHAB POTENTIAL: Fair due to nature of condition  CLINICAL  DECISION MAKING: Evolving/moderate complexity  EVALUATION COMPLEXITY: Moderate  PLAN:  PT FREQUENCY: 2x/week  PT DURATION: 8 weeks  PLANNED INTERVENTIONS: 97164- PT Re-evaluation, 97110-Therapeutic exercises, 97530- Therapeutic activity, 97112- Neuromuscular re-education, 97535- Self Care, 40981- Manual therapy, [phone]- Gait training, [phone]- Aquatic Therapy, Patient/Family education, Balance training, Stair training, and Visual/preceptual remediation/compensation  PLAN FOR NEXT SESSION: Add HEP considering single limb stance near support on R side, standing with head motions or controlled 1/2 turns to work on repetition for scanning environment due to visual deficits, discuss continued outdoor walking with supervision, and trial coordination activities for clearing obstacles and determine if safe for home. Focus on foot clearance near obstacles, scanning environment in therapy with cards/visual cues. Engage spouse with HEP due to potentially limited carryover.   9:49 AM, 08/30/23 M. Kelly Jaideep Pollack, PT, DPT Physical Therapist- [place] Office Number: [phone]

## 2023-08-30 NOTE — Patient Instructions (Signed)
 Vision Compensation Strategies Look for the edge of objects (to the left and/or right) so that you make sure you are seeing all of an object  Turn your head when walking, scan from side to side, particularly in busy environments   Use an organized scanning pattern. It's usually easier to scan from top to bottom, and left to right (like you are reading)  Double check yourself   Use a line guide (like a blank piece of paper) or your finger when reading  If necessary, place brightly colored tape at end of table or work area as a reminder to always look until you see the tape.       Activities to try at home to encourage visual scanning:   1. Word searches 2. Mazes 3. Puzzles 4. Card games 5. Computer games and/or searches 6. Connect-the-dots  Activities for environmental (larger) scanning:  1. With supervision, scan for items in grocery store or drugstore. Begin with a familiar store, then progress to a new store you've never been in before. Make sure you have supervision with this.   2. With supervision, tell a family member or caregiver when it is safe to cross a street after looking all directions and any side streets. However, do NOT cross street unless family member or caregiver is with you and says it is [place]

## 2023-08-31 ENCOUNTER — Ambulatory Visit
Admission: RE | Admit: 2023-08-31 | Discharge: 2023-08-31 | Disposition: A | Discharge status: Home / Self Care | Source: Ambulatory Visit | Attending: Radiation Oncology

## 2023-08-31 ENCOUNTER — Other Ambulatory Visit: Payer: Self-pay

## 2023-08-31 DIAGNOSIS — C713 Malignant neoplasm of parietal lobe: Secondary | ICD-10-CM | POA: Diagnosis not present

## 2023-08-31 DIAGNOSIS — Z510 Encounter for antineoplastic radiation therapy: Secondary | ICD-10-CM | POA: Diagnosis not present

## 2023-08-31 DIAGNOSIS — R5383 Other fatigue: Secondary | ICD-10-CM | POA: Diagnosis not present

## 2023-08-31 LAB — RAD ONC ARIA SESSION SUMMARY
Course Elapsed Days: 24
Plan Fractions Treated to Date: 19
Plan Prescribed Dose Per Fraction: 2 Gy
Plan Total Fractions Prescribed: 23
Plan Total Prescribed Dose: 46 Gy
Reference Point Dosage Given to Date: 38 Gy
Reference Point Session Dosage Given: 2 Gy
Session Number: 19

## 2023-09-01 ENCOUNTER — Other Ambulatory Visit: Payer: Self-pay

## 2023-09-01 ENCOUNTER — Ambulatory Visit
Admission: RE | Admit: 2023-09-01 | Discharge: 2023-09-01 | Disposition: A | Discharge status: Home / Self Care | Source: Ambulatory Visit | Attending: Radiation Oncology | Admitting: Radiation Oncology

## 2023-09-01 DIAGNOSIS — Z510 Encounter for antineoplastic radiation therapy: Secondary | ICD-10-CM | POA: Diagnosis not present

## 2023-09-01 DIAGNOSIS — R5383 Other fatigue: Secondary | ICD-10-CM | POA: Diagnosis not present

## 2023-09-01 DIAGNOSIS — C713 Malignant neoplasm of parietal lobe: Secondary | ICD-10-CM | POA: Diagnosis not present

## 2023-09-01 LAB — RAD ONC ARIA SESSION SUMMARY
Course Elapsed Days: 25
Plan Fractions Treated to Date: 20
Plan Prescribed Dose Per Fraction: 2 Gy
Plan Total Fractions Prescribed: 23
Plan Total Prescribed Dose: 46 Gy
Reference Point Dosage Given to Date: 40 Gy
Reference Point Session Dosage Given: 2 Gy
Session Number: 20

## 2023-09-04 ENCOUNTER — Ambulatory Visit
Admission: RE | Admit: 2023-09-04 | Discharge: 2023-09-04 | Disposition: A | Discharge status: Home / Self Care | Source: Ambulatory Visit | Attending: Radiation Oncology | Admitting: Radiation Oncology

## 2023-09-04 ENCOUNTER — Other Ambulatory Visit: Payer: Self-pay

## 2023-09-04 ENCOUNTER — Inpatient Hospital Stay

## 2023-09-04 ENCOUNTER — Inpatient Hospital Stay: Admitting: Internal Medicine

## 2023-09-04 VITALS — BP 121/73 | HR 81 | Temp 97.3°F | Resp 18 | Wt 207.2 lb

## 2023-09-04 DIAGNOSIS — C713 Malignant neoplasm of parietal lobe: Secondary | ICD-10-CM

## 2023-09-04 DIAGNOSIS — R5383 Other fatigue: Secondary | ICD-10-CM | POA: Diagnosis not present

## 2023-09-04 DIAGNOSIS — Z510 Encounter for antineoplastic radiation therapy: Secondary | ICD-10-CM | POA: Diagnosis not present

## 2023-09-04 DIAGNOSIS — C719 Malignant neoplasm of brain, unspecified: Secondary | ICD-10-CM

## 2023-09-04 LAB — CMP (CANCER CENTER ONLY)
ALT: 18 U/L (ref 0–44)
AST: 14 U/L — ABNORMAL LOW (ref 15–41)
Albumin: 4.7 g/dL (ref 3.5–5.0)
Alkaline Phosphatase: 69 U/L (ref 38–126)
Anion gap: 8 (ref 5–15)
BUN: 16 mg/dL (ref 8–23)
CO2: 28 mmol/L (ref 22–32)
Calcium: 9.7 mg/dL (ref 8.9–10.3)
Chloride: 104 mmol/L (ref 98–111)
Creatinine: 1.06 mg/dL (ref 0.61–1.24)
GFR, Estimated: >60 mL/min (ref >60)
Glucose, Bld: 251 mg/dL — ABNORMAL HIGH (ref 70–99)
Potassium: 4.6 mmol/L (ref 3.5–5.1)
Sodium: 140 mmol/L (ref 135–145)
Total Bilirubin: 0.7 mg/dL (ref 0.0–1.2)
Total Protein: 7.3 g/dL (ref 6.5–8.1)

## 2023-09-04 LAB — CBC WITH DIFFERENTIAL (CANCER CENTER ONLY)
Abs Immature Granulocytes: 0.03 10*3/uL (ref 0.00–0.07)
Basophils Absolute: 0 10*3/uL (ref 0.0–0.1)
Basophils Relative: 1 %
Eosinophils Absolute: 0.4 10*3/uL (ref 0.0–0.5)
Eosinophils Relative: 5 %
HCT: 43.1 % (ref 39.0–52.0)
Hemoglobin: 14.9 g/dL (ref 13.0–17.0)
Immature Granulocytes: 0 %
Lymphocytes Relative: 13 %
Lymphs Abs: 1 10*3/uL (ref 0.7–4.0)
MCH: 31.1 pg (ref 26.0–34.0)
MCHC: 34.6 g/dL (ref 30.0–36.0)
MCV: 90 fL (ref 80.0–100.0)
Monocytes Absolute: 0.6 10*3/uL (ref 0.1–1.0)
Monocytes Relative: 7 %
Neutro Abs: 5.6 10*3/uL (ref 1.7–7.7)
Neutrophils Relative %: 74 %
Platelet Count: 251 10*3/uL (ref 150–400)
RBC: 4.79 MIL/uL (ref 4.22–5.81)
RDW: 13.2 % (ref 11.5–15.5)
WBC Count: 7.6 10*3/uL (ref 4.0–10.5)
nRBC: 0 % (ref 0.0–0.2)

## 2023-09-04 LAB — RAD ONC ARIA SESSION SUMMARY
Course Elapsed Days: 28
Plan Fractions Treated to Date: 21
Plan Prescribed Dose Per Fraction: 2 Gy
Plan Total Fractions Prescribed: 23
Plan Total Prescribed Dose: 46 Gy
Reference Point Dosage Given to Date: 42 Gy
Reference Point Session Dosage Given: 2 Gy
Session Number: 21

## 2023-09-04 NOTE — Progress Notes (Signed)
 [facility] at [facility] [address]  [place], [place] [address] [phone]   Interval Evaluation  Date of Service: 09/04/23 Patient Name: Javier Graham Patient MRN: [id number] Patient DOB: 03-15-1956 Provider: Mamie Searles, MD  Identifying Statement:  Javier Graham is a 68 y.o. male with right parietal glioblastoma    Oncologic History: Oncology History  Glioblastoma, IDH-wildtype (HCC)  07/05/2023 Surgery   Craniotomy, resection at [facility] with Dr. Lydia Graham; path is glioblastoma   08/03/2023 -  Chemotherapy   Patient is on Treatment Plan : BRAIN GLIOBLASTOMA Radiation Therapy With Concurrent Temozolomide  75 mg/m2 Daily Followed By Sequential Maintenance Temozolomide  x 6-12 cycles       Biomarkers:  MGMT Unknown.  IDH 1/2 Wild type.  EGFR Unknown  TERT Unknown   Interval History: Javier Graham presents today for follow up, now having completed 4 weeks of radiation and Temodar .  No new or progressive changes.  Does describe increased fatigue.  Wife notes issues with memory, including not remembering her identity at times.  Denies seizures, headaches.    H+P (07/27/23) Patient presents to establish care locally for his new brain tumor.  He and his wife describe issues with driving navigation, some confusion which led to his assessment in the ED.  CNS imaging demonstrated a large enhancing mass within the right parietal lobe, c/w primary brain tumor.  He underwent surgery at [facility] on 2/26 with Dr. Lydia Graham; path demonstrated glioblastoma IDHwt.  Following surgery he has noticed some issues with navigating space and finding certain words.  No longer dosing Keppra or decadron.  Medications: Current Outpatient Medications on File Prior to Visit  Medication Sig Dispense Refill   ascorbic acid (VITAMIN C) 500 MG tablet Take 500 mg by mouth daily.     aspirin EC 81 MG tablet Take 81 mg by mouth daily.     Blood Glucose Monitoring Suppl (ACCU-CHEK GUIDE)  w/Device KIT USE AS DIRECTED TO CHECK BLOOD GLUCOSE     clopidogrel  (PLAVIX ) 75 MG tablet Take 1 tablet (75 mg total) by mouth daily. 30 tablet 11   dapagliflozin  propanediol (FARXIGA ) 5 MG TABS tablet Take 5 mg by mouth daily. 30 tablet 11   glimepiride  (AMARYL ) 4 MG tablet Take 1 tablet (4 mg total) by mouth daily before breakfast. 30 tablet 3   lisinopril (ZESTRIL) 5 MG tablet Take 5 mg by mouth daily.     Melatonin 5 MG TABS Take 2 tablets by mouth as needed.     metFORMIN (GLUMETZA) 500 MG (MOD) 24 hr tablet Take 500 mg by mouth. Take 2 tabs twice daily     ondansetron  (ZOFRAN ) 8 MG tablet Take 1 tablet (8 mg total) by mouth every 8 (eight) hours as needed for nausea or vomiting. May take 30-60 minutes prior to Temodar  administration if nausea/vomiting occurs as needed. 30 tablet 1   polyethylene glycol powder (GLYCOLAX/MIRALAX) 17 GM/SCOOP powder Take by mouth daily as needed.     Sennosides (SENOKOT PO) Take 1 tablet by mouth daily as needed.     temozolomide  (TEMODAR ) 140 MG capsule Take 1 capsule (140 mg total) by mouth daily. (Take with one 20 mg capsule for total daily dose of 160 mg). May take on an empty stomach to decrease nausea & vomiting. 42 capsule 0   temozolomide  (TEMODAR ) 20 MG capsule Take 1 capsule (20 mg total) by mouth daily. (Take with one 140 mg capsule for total daily dose of 160 mg). May  take on an empty stomach to decrease nausea & vomiting. 42 capsule 0   VICTOZA  18 MG/3ML SOPN INJECT 0.3 ML'S (1.8 MG TOTAL) INTO THE SKIN DAILY 9 pen 3   No current facility-administered medications on file prior to visit.    Allergies: No Known Allergies Past Medical History:  Past Medical History:  Diagnosis Date   Diabetes (HCC)    Diplopia    Headache    Past Surgical History:  Past Surgical History:  Procedure Laterality Date   BUNIONECTOMY     Social History:  Social History   Socioeconomic History   Marital status: Married    Spouse name: Not on file   Number  of children: 3   Years of education: college   Highest education level: Master's degree (e.g., MA, MS, MEng, MEd, MSW, MBA)  Occupational History   Not on file  Tobacco Use   Smoking status: Never   Smokeless tobacco: Never  Substance and Sexual Activity   Alcohol use: No    Comment: none since 1996   Drug use: No   Sexual activity: Not on file  Other Topics Concern   Not on file  Social History Narrative   Lives at home with his wife.   Right-handed.   One can of Coke Zero per day.   Social Drivers of Corporate investment banker Strain: High Risk (07/10/2023)   Received from [facility]   Overall Financial Resource Strain (CARDIA)    Difficulty of Paying Living Expenses: Hard  Food Insecurity: No Food Insecurity (07/27/2023)   Hunger Vital Sign    Worried About Running Out of Food in the Last Year: Never true    Ran Out of Food in the Last Year: Never true  Transportation Needs: No Transportation Needs (07/10/2023)   Received from [facility] - Transportation    In the past 12 months, has lack of transportation kept you from medical appointments or from getting medications?: No    Lack of Transportation (Non-Medical): No  Physical Activity: Not on file  Stress: Not on file  Social Connections: Not on file  Intimate Partner Violence: Not on file   Family History:  Family History  Problem Relation Age of Onset   Heart disease Other    Diabetes Other    Diabetes Father    Stroke Father        x 2   Heart disease Father    Diabetes Sister    Diabetes Brother    Heart disease Mother     Review of Systems: Constitutional: Doesn't report fevers, chills or abnormal weight loss Eyes: Doesn't report blurriness of vision Ears, nose, mouth, throat, and face: Doesn't report sore throat Respiratory: Doesn't report cough, dyspnea or wheezes Cardiovascular: Doesn't report palpitation, chest discomfort  Gastrointestinal:  Doesn't  report nausea, constipation, diarrhea GU: Doesn't report incontinence Skin: Doesn't report skin rashes Neurological: Per HPI Musculoskeletal: Doesn't report joint pain Behavioral/Psych: Doesn't report anxiety  Physical Exam: Vitals:   09/04/23 0839  BP: 121/73  Pulse: 81  Resp: 18  Temp: (!) 97.3 F (36.3 C)  SpO2: 100%   KPS: 80. General: Alert, cooperative, pleasant, in no acute distress Head: Normal EENT: No conjunctival injection or scleral icterus.  Lungs: Resp effort normal Cardiac: Regular rate Abdomen: Non-distended abdomen Skin: No rashes cyanosis or petechiae. Extremities: No clubbing or edema  Neurologic Exam: Mental Status: Awake, alert, attentive to examiner. Oriented to self and environment.  Language is fluent with intact comprehension.  Cranial Nerves: Visual acuity is grossly normal. Visual fields are full. Extra-ocular movements intact. No ptosis. Face is symmetric Motor: Tone and bulk are normal. Power is full in both arms and legs. Reflexes are symmetric, no pathologic reflexes present.  Sensory: Intact to light touch Gait: Normal.   Labs: I have reviewed the data as listed    Component Value Date/Time   NA 140 09/04/2023 0738   NA 139 12/19/2018 0858   K 4.6 09/04/2023 0738   CL 104 09/04/2023 0738   CO2 28 09/04/2023 0738   GLUCOSE 251 (H) 09/04/2023 0738   BUN 16 09/04/2023 0738   BUN 14 12/19/2018 0858   CREATININE 1.06 09/04/2023 0738   CALCIUM 9.7 09/04/2023 0738   PROT 7.3 09/04/2023 0738   PROT 6.8 12/19/2018 0858   ALBUMIN 4.7 09/04/2023 0738   ALBUMIN 4.6 12/19/2018 0858   AST 14 (L) 09/04/2023 0738   ALT 18 09/04/2023 0738   ALKPHOS 69 09/04/2023 0738   BILITOT 0.7 09/04/2023 0738   GFRNONAA >60 09/04/2023 0738   GFRAA 90 12/19/2018 0858   Lab Results  Component Value Date   WBC 7.6 09/04/2023   NEUTROABS 5.6 09/04/2023   HGB 14.9 09/04/2023   HCT 43.1 09/04/2023   MCV 90.0 09/04/2023   PLT 251 09/04/2023      Assessment/Plan Glioblastoma, IDH-wildtype (HCC)  [name] is clinically stable today, now having completed 4 weeks of IMRT and concurrent Temodar .  Labs are within normal limits.   We ultimately recommended continuing with course of intensity modulated radiation therapy and concurrent daily Temozolomide .  Radiation will be administered Mon-Fri over 6 weeks, Temodar  will be dosed at 75mg /m2 to be given daily over 42 days.  We reviewed side effects of temodar , including fatigue, nausea/vomiting, constipation, and cytopenias.  Informed consent was verbally obtained at bedside to proceed with oral chemotherapy.  Chemotherapy should be held for the following:  ANC less than 1,000  Platelets less than 100,000  LFT or creatinine greater than 2x ULN  If clinical concerns/contraindications develop  Every 2 weeks during radiation, labs will be checked accompanied by a clinical evaluation in the brain tumor clinic.  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  All questions were answered. The patient knows to call the clinic with any problems, questions or concerns. No barriers to learning were detected.  The total time spent in the encounter was 30 minutes and more than 50% was on counseling and review of test results   Javier Searles, MD Medical Director of Neuro-Oncology [facility] at [place] 09/04/23 9:30 AM

## 2023-09-05 ENCOUNTER — Other Ambulatory Visit: Payer: Self-pay

## 2023-09-05 ENCOUNTER — Ambulatory Visit
Admission: RE | Admit: 2023-09-05 | Discharge: 2023-09-05 | Disposition: A | Discharge status: Home / Self Care | Source: Ambulatory Visit | Attending: Radiation Oncology

## 2023-09-05 DIAGNOSIS — R5383 Other fatigue: Secondary | ICD-10-CM | POA: Diagnosis not present

## 2023-09-05 DIAGNOSIS — C713 Malignant neoplasm of parietal lobe: Secondary | ICD-10-CM | POA: Diagnosis not present

## 2023-09-05 DIAGNOSIS — Z510 Encounter for antineoplastic radiation therapy: Secondary | ICD-10-CM | POA: Diagnosis not present

## 2023-09-05 LAB — RAD ONC ARIA SESSION SUMMARY
Course Elapsed Days: 29
Plan Fractions Treated to Date: 22
Plan Prescribed Dose Per Fraction: 2 Gy
Plan Total Fractions Prescribed: 23
Plan Total Prescribed Dose: 46 Gy
Reference Point Dosage Given to Date: 44 Gy
Reference Point Session Dosage Given: 2 Gy
Session Number: 22

## 2023-09-06 ENCOUNTER — Ambulatory Visit
Admission: RE | Admit: 2023-09-06 | Discharge: 2023-09-06 | Disposition: A | Discharge status: Home / Self Care | Source: Ambulatory Visit | Attending: Radiation Oncology | Admitting: Radiation Oncology

## 2023-09-06 ENCOUNTER — Ambulatory Visit: Admitting: Occupational Therapy

## 2023-09-06 ENCOUNTER — Ambulatory Visit

## 2023-09-06 ENCOUNTER — Other Ambulatory Visit: Payer: Self-pay

## 2023-09-06 DIAGNOSIS — M6281 Muscle weakness (generalized): Secondary | ICD-10-CM | POA: Diagnosis not present

## 2023-09-06 DIAGNOSIS — C713 Malignant neoplasm of parietal lobe: Secondary | ICD-10-CM | POA: Diagnosis not present

## 2023-09-06 DIAGNOSIS — R278 Other lack of coordination: Secondary | ICD-10-CM

## 2023-09-06 DIAGNOSIS — R2681 Unsteadiness on feet: Secondary | ICD-10-CM | POA: Diagnosis not present

## 2023-09-06 DIAGNOSIS — Z510 Encounter for antineoplastic radiation therapy: Secondary | ICD-10-CM | POA: Diagnosis not present

## 2023-09-06 DIAGNOSIS — R41840 Attention and concentration deficit: Secondary | ICD-10-CM

## 2023-09-06 DIAGNOSIS — R29818 Other symptoms and signs involving the nervous system: Secondary | ICD-10-CM

## 2023-09-06 DIAGNOSIS — R208 Other disturbances of skin sensation: Secondary | ICD-10-CM | POA: Diagnosis not present

## 2023-09-06 DIAGNOSIS — R2689 Other abnormalities of gait and mobility: Secondary | ICD-10-CM | POA: Diagnosis not present

## 2023-09-06 DIAGNOSIS — R5383 Other fatigue: Secondary | ICD-10-CM | POA: Diagnosis not present

## 2023-09-06 LAB — RAD ONC ARIA SESSION SUMMARY
Course Elapsed Days: 30
Plan Fractions Treated to Date: 23
Plan Prescribed Dose Per Fraction: 2 Gy
Plan Total Fractions Prescribed: 23
Plan Total Prescribed Dose: 46 Gy
Reference Point Dosage Given to Date: 46 Gy
Reference Point Session Dosage Given: 2 Gy
Session Number: 23

## 2023-09-06 NOTE — Therapy (Signed)
 OUTPATIENT PHYSICAL THERAPY NEURO TREATMENT   Patient Name: Javier Graham MRN: [id number] DOB:05/06/1956, 68 y.o., male Today's Date: 09/06/2023   PCP: Vallarie Gauze, MD REFERRING PROVIDER: Rheba Cedar, PA  END OF SESSION:  PT End of Session - 09/06/23 0851     Visit Number 3    Number of Visits 16    Date for PT Re-Evaluation 10/06/23    Authorization Type [employer] Medicare    PT Start Time 0850    PT Stop Time 0930    PT Time Calculation (min) 40 min    Activity Tolerance Patient tolerated treatment well    Behavior During Therapy [facility] for tasks assessed/performed            Past Medical History:  Diagnosis Date   Diabetes (HCC)    Diplopia    Headache    Past Surgical History:  Procedure Laterality Date   BUNIONECTOMY     Patient Active Problem List   Diagnosis Date Noted   Glioblastoma, IDH-wildtype (HCC) 07/27/2023   Diplopia 12/19/2018   Stenosis of right vertebral artery 12/19/2018   Diabetes (HCC) 06/19/2016   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   High ankle sprain 10/17/2012   Leg pain 10/17/2012   ONSET DATE: 07/05/23 REFERRING DIAG:  Diagnosis  C71.3 (ICD-10-CM) - Malignant neoplasm of parietal lobe   THERAPY DIAG:  Unsteadiness on feet  Other abnormalities of gait and mobility  Other symptoms and signs involving the nervous system  Muscle weakness (generalized)  Rationale for Evaluation and Treatment: Rehabilitation SUBJECTIVE:                                                                                                                                                                                         SUBJECTIVE STATEMENT: No new issues Pt accompanied by: family member  PERTINENT HISTORY: diabetes, h/o HA and h/o diplopia  PAIN:  Are you having pain? Yes; can get HA and low back pain. Also notes an achiness in thighs when beginning activities that is new with current medical issues.  PRECAUTIONS: Fall  WEIGHT  BEARING RESTRICTIONS: No  FALLS: Has patient fallen in last 6 months? Yes. Number of falls 1  one fall while getting out of bed in the middle of the night  LIVING ENVIRONMENT: Lives with: lives with their family Lives in: House/apartment Stairs: Yes: Internal: 12-14 steps to top floor and to basement, able to live on main floor steps; handrail Has following equipment at home: None  PLOF: Independent spends time with grandkids  PATIENT GOALS: I want to be able to go out and hike with grandkids without limitations (10 and 14  years old)  OBJECTIVE:   TODAY'S TREATMENT: 09/06/23 Activity Comments  NU-step LE only x 8 min -2 min warm-up at level 8 -resistance level 6 performing dual task (3-letter word scramble) requiring cues   Coordination/crossing midline -reaching across midline transferring cones to table to build pyramid. Frequent cues for attention and sequence. Difficulty in replicating patterns  1/2 turns finding target Repeated left/right turns for visual scanning  4-square step -tape lines -canes Frequent cues for sequence, direction  Tandem stance 1x30 sec         TODAY'S TREATMENT: 08/30/23 Activity Comments  Gross assessment for visual field Seems to have 90 deg left visual cut/inattention. Inferior field also limited  C/o thigh spasm/cramping when walking 30-45 min Assessed quads/hamstrings for spasticity, none present/obvious  Treadmill x 5 min at 2.0 mph Poor-fair balance on treadmill especially when turning head to attend to conversation  Sit to stand w/ cross-body alternate knee taps 2x10 Cues for safety and reaching for arm rests  Seated reaching to side and across body 2x10 Cues for attending to hand/object when crossing midline--better with bright colored object to hand          PATIENT EDUCATION: Education details: HEP initiation, bungee leader for walking with spouse Person educated: Patient and Spouse Education method: Explanation, Facilities manager, and  Handouts Education comprehension: verbalized understanding, returned demonstration, and needs further education  HOME EXERCISE PROGRAM: Access Code: [email or web address] URL: [email or web address] Date: 08/30/2023 Prepared by: Kelly Halie Gass  Program Notes sit to stand with cross-body alternate knee taps 3x10   Exercises - Seated Reaching to Side and Across Body  - 1 x daily - 7 x weekly - 3 sets - 10 reps __________________________________________________________________________________________________________ Note: Objective measures were completed at Evaluation unless otherwise noted.  DIAGNOSTIC FINDINGS: reviewed in chart  COGNITION: Overall cognitive status:  impaired with memory changes   SENSATION: WFL  COORDINATION: Rapid alternating movements, FTN, and HTS are equal and symmetric bilaterally  MUSCLE TONE: WNLs  POSTURE: No Significant postural limitations  LOWER EXTREMITY ROM:    WFLs per observation t/o evaluation  LOWER EXTREMITY MMT:  WFLs for all activities demonstrating functional squat to the ground and return to stand.  BED MOBILITY:  Findings: independent  TRANSFERS: Sit to stand: Complete Independence  Assistive device utilized: None      STAIRS: Patient requires CGA due to catching his toes on the edge of steps x 4 steps  GAIT: Device: None Assist: Patient needs supervision due to visual deficits with L visual field cut and possible inattention/neglect. Characteristics:  Gait is characterized by slower speeds, mostly due to navigating obstacles in clinic Gait speed 2.44 ft/sec  FUNCTIONAL TESTS:  Berg=50/56 FGA=22/30    PATIENT SURVEYS:  Planned to use patient specific activity score, however, due to memory and awareness did not ask.    __________________________________________________________________________________________________________ GOALS: Goals reviewed with patient? Yes  SHORT TERM GOALS: Target date: 09/21/23  The  patient will perform HEP with cues/reminders from spouse. Baseline: initiated at eval Goal status: INITIAL  2.  The patient will maintain single limb standing x 10 seconds R and L sides.  Baseline:  Holds 3-4 seconds on each LE Goal status: INITIAL  LONG TERM GOALS: Target date: 10/06/23  The patient will perform progression of HEP with assist from family as needed. Baseline:  initiated at eval Goal status: INITIAL  2.  The patient will improve FGA to > or equal to 24/30. Baseline: 22/30 *anticipate that visual deficits will lead to continued need for supervision  during some tasks Goal status: INITIAL  3.  The patient will report resolution of thigh discomfort with extended ambulation > 30 minutes. Baseline:  notes fatigue and achiness in bilateral quads Goal status: INITIAL  4.  The patient will demonstrate floor<>stand due to h/o 1 fall to ensure ability to safety stand with UE support through floor. Baseline:  due to h/o fall Goal status: INITIAL  ASSESSMENT:  CLINICAL IMPRESSION: NU-step with cognitive dual-task with significant difficulty under these demands with prompting. Coordination/attention tasks to improve attention and sequencing with difficulty in sequencing LUE attention and replication of instruction requiring frequent prompts and for error correction.  Difficulty with left visual field attention tasks requiring prompting and single step directions.  Dynamic balance to improve safety awarenes and environmental scanning with difficulty under narrow BOS demands and crossing of midline.  Continued sessions to progress POC details to improve mobility, safety awareness and motor control    OBJECTIVE IMPAIRMENTS: Abnormal gait, decreased balance, decreased safety awareness, impaired vision/preception, and pain.   ACTIVITY LIMITATIONS: locomotion level  PARTICIPATION LIMITATIONS: community activity and yard work  PERSONAL FACTORS: 1-2 comorbidities: diabetes, glioblastoma   are also affecting patient's functional outcome.   REHAB POTENTIAL: Fair due to nature of condition  CLINICAL DECISION MAKING: Evolving/moderate complexity  EVALUATION COMPLEXITY: Moderate  PLAN:  PT FREQUENCY: 2x/week  PT DURATION: 8 weeks  PLANNED INTERVENTIONS: 97164- PT Re-evaluation, 97110-Therapeutic exercises, 97530- Therapeutic activity, 97112- Neuromuscular re-education, 97535- Self Care, 84166- Manual therapy, [phone]- Gait training, [phone]- Aquatic Therapy, Patient/Family education, Balance training, Stair training, and Visual/preceptual remediation/compensation  PLAN FOR NEXT SESSION: Add HEP considering single limb stance near support on R side, standing with head motions or controlled 1/2 turns to work on repetition for scanning environment due to visual deficits, discuss continued outdoor walking with supervision, and trial coordination activities for clearing obstacles and determine if safe for home. Focus on foot clearance near obstacles, scanning environment in therapy with cards/visual cues. Engage spouse with HEP due to potentially limited carryover.   8:51 AM, 09/06/23 M. Kelly Larrie Fraizer, PT, DPT Physical Therapist- [place] Office Number: [phone]

## 2023-09-06 NOTE — Therapy (Signed)
 OUTPATIENT OCCUPATIONAL THERAPY NEURO Treatment  Patient Name: Javier Graham MRN: [id number] DOB:January 31, 1956, 68 y.o., male Today's Date: 09/06/2023  PCP:  Vallarie Gauze, MD  REFERRING PROVIDER: Rachel Budds, PA  END OF SESSION:  OT End of Session - 09/06/23 1327     Visit Number 3    Number of Visits 13    Date for OT Re-Evaluation 10/06/23    Authorization Type [employer] - auth submitted    OT Start Time [id number]    OT Stop Time 1009    OT Time Calculation (min) 38 min    Activity Tolerance Patient tolerated treatment well    Behavior During Therapy [facility] for tasks assessed/performed               Past Medical History:  Diagnosis Date   Diabetes (HCC)    Diplopia    Headache    Past Surgical History:  Procedure Laterality Date   BUNIONECTOMY     Patient Active Problem List   Diagnosis Date Noted   Glioblastoma, IDH-wildtype (HCC) 07/27/2023   Diplopia 12/19/2018   Stenosis of right vertebral artery 12/19/2018   Diabetes (HCC) 06/19/2016   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 Pt reported "good" overall, "no problems."  From eval: The patient presented to ED due to difficulties with driving navigation and confusion. He was found to have a large R parietal lobe mass that was resected at [facility] on 07/05/23 and pathology is consistent with glioblastoma.   Pt accompanied by: self  and significant other(wife-[name] in waiting room)  PERTINENT HISTORY: diabetes, h/o HA and h/o diplopia   PRECAUTIONS: Fall and Other: s/p surgery did have restriction of no lifting > 15# , L visual field cut  WEIGHT BEARING RESTRICTIONS: No  PAIN:  Are you having pain? R leg pain after exercises, 8/10 pain  FALLS: Has patient fallen in last 6 months? Yes. Number of falls 1, fell in February and hit his eye went to ED  LIVING ENVIRONMENT: Lives with: lives with their spouse Lives in: House/apartment Stairs: Yes: Internal: bedroom is on main level, but does have a flight of steps to downstairs "rec room" and second floor which he rarely goes up steps; handrail and External: 1 steps in from garage Has following equipment at home: Walker - 4 wheeled, Grab bars, and built in bench in shower that pt has used s/p surgery  PLOF: Independent and Independent with basic ADLs; retired, spends time with grandkids (10 and 1 yo)  PATIENT GOALS: to be able to drive again; to be able to do things with the grandchildren  OBJECTIVE:  Note: Objective measures were completed at Evaluation unless otherwise noted.  HAND DOMINANCE: Right  ADLs: Overall ADLs: spouse reports pt will frequently put clothing on backwards Transfers/ambulation related to ADLs: Eating: pt will occasionally not see items on L during eating, has broken glasses that were on L side as he did not see them LB Dressing:  occasional increased time to problem solve tying shoes Toileting: Mod I Bathing: Mod I Tub Shower transfers: Mod I Equipment: Grab bars and Walk in shower  IADLs: Light housekeeping: he helps make the bed and dries dishes, takes out the garbage independently Meal Prep: Intermittent supervision as pt grilled steaks, pt is not primary cook.   Community mobility: not cleared to drive  Medication management: did not ask due to time constraints  MOBILITY STATUS:  somewhat guarded due to L peripheral vision  impairment  POSTURE COMMENTS:  No Significant postural limitations  ACTIVITY TOLERANCE: Activity tolerance: decreased endurance  FUNCTIONAL OUTCOME MEASURES: Physical performance test: not completed this session due to time constraints and pt with decreased awareness of deficits* 9 hole peg test: 9 Hole Peg test: Right: 46.85 sec; Left: 65.87 sec; pt demonstrating decreased recall of instructions and decreased awareness as picking up multiple pegs on L and requiring cues to remove pegs after placement bilaterally   UPPER EXTREMITY ROM:  WFL bilaterally  UPPER EXTREMITY MMT:   grossly 5/5 bilaterally  COORDINATION: 9 Hole Peg test: Right: 46.85 sec; Left: 65.87 sec - pt demonstrating decreased recall of instructions and decreased awareness as picking up multiple pegs on L and requiring cues to remove pegs after placement bilaterally  Rapid alternating movements and FTN are equal and symmetric bilaterally   SENSATION: WFL on eval; however may need to further assess functionally as pt with initial concerns for L sensory impairment on chart review  COGNITION: Overall cognitive status:  impaired, with memory changes  VISION: Subjective report: L neglect   VISION ASSESSMENT: To be further assessed in functional context Tracking/Visual pursuits: Decreased smoothness with horizontal tracking and difficulty to assess due to difficulty with following commands Visual Fields: Left visual field deficits - pt reports seeing movement in L peripheral vision prior to recognition of stimulus at 30*  PERCEPTION: Impaired: Inattention/neglect: does not attend to left visual field  OBSERVATIONS: Pleasant gentleman with supportive wife who is able to provide additional information as pt with decreased awareness of impairments and impact of impairments on safety and independence.                                                                                                                              TREATMENT DATE:    TherAct Pt benefited from v/c to recognize presence of arm of chair when attempting to sit. OT provided v/c to ensure safety when transitioning from standing-to-sitting and to reduce fall risk.   Puzzle task (12-piece) - for sequencing, organization, cognition, FM coordination, and dexterity, visual scanning. Pt benefited from max v/c for cognitive components of task, task graded down to decreasing number of available puzzle pieces for review at any one time (approx. 3-4 pieces) and building puzzle on top of printed visual reference.  Letter cancellation and Number cancellation activity - for visual scanning, to improve understanding of visual compensation strategies. Visual anchor provided on L side of  page, pt initially demo'd fading use of line guide possibly d/t difficulty with recall. Letters: Pt identified all letters with x1 error on R side of page which pt self-corrected with extra time. Pt demo'd x9 errors on L side of page (x7 missed letters and highlighting different letters at x2 instances). Numbers: Pt demo'd x1 error with fading assistance and V/c to identify and use visual anchor and line guide consistently.   Review of visual compensation strategies when ambulating to exit clinic - e.g. identifying boundary line. Pt returned demo with v/c.   PATIENT EDUCATION: Education details: see today's tx above Person educated: Patient and Spouse Education method: Explanation Education comprehension: needs further education  HOME EXERCISE PROGRAM: 08/30/23 - visual scanning (see pt instructions)   GOALS: Goals reviewed with patient? Yes  SHORT TERM GOALS: Target date: 09/15/23  Pt will be independent in coordination HEP with use of visual handouts. Baseline: decreased coordination bilaterally on 9 hole peg test Goal status: in progress  2.  Pt will verbalize understanding with safety considerations and AE needs to ensure safety d/t decreased sensation.   Baseline: diminished sensation per chart review Goal status: in progress  3.  Pt will demonstrate understanding of memory compensations and ways to keep thinking skills sharp. Baseline: decreased memory for use of visual compensation Goal status: in progress  4.  Pt will verbalize understanding of energy conservation strategies to increase safety with ADLs and IADLs and report 2 in use to increase endurance as needed to complete leisure tasks (to include playing and hiking with grandchildren). Baseline: decreased endurance Goal status: in progress  LONG TERM GOALS: Target date: 10/06/23  Pt will verbalize understanding of task modifications and/or potential DME/AE needs to increase ease, safety, and independence w/ ADLs.  Baseline: decreased endurance, donning clothing backwards with decreased awareness Goal status: INITIAL  2.  Pt will complete table top scanning activity with Supervision. Baseline: L peripheral impairment Goal status: INITIAL  3.  Pt will navigate a moderately busy environment, following multi-step commands with 90% accuracy Baseline: difficulty with dual tasking, recall of instructions/compensatory strategies Goal status: INITIAL  4.  Pt will demonstrate ability to sequence simple functional task/IADL (simple snack prep, laundry task, etc) at Mod I level with good safety awareness Baseline: decreased sequence and recall of instructions/compensatory strategies Goal status: INITIAL   ASSESSMENT:  CLINICAL IMPRESSION: Pt tolerated tasks fairly well though continues to demo significant difficulty with cognitive components of task and recall of a task's purpose/goal as evidenced by pt requiring frequent cues. Pt would continue to benefit from review of visual scanning strategies during functional tasks to improve safety and attention to affected side. Pt demo'ing L inattention during functional tasks. Pt benefited from line guide, cueing, and visual anchors on page to  improve attention to L side. Consider SLP referral request and discuss with pt at upcoming visit to address cognition concerns. Pt will benefit from skilled occupational therapy services to address coordination, pain management, altered sensation, balance, GM/FM control, cognition, safety awareness, introduction of compensatory strategies/AE prn, visual-perception, and implementation of an HEP to improve participation and safety during ADLs and IADLs.    PERFORMANCE DEFICITS: in functional skills including ADLs, IADLs, coordination, proprioception, sensation, pain, Fine motor control, Gross motor control, body mechanics, endurance, decreased knowledge of precautions, decreased knowledge of use of DME, vision, and UE functional use, cognitive skills including attention, memory, problem solving, safety awareness, and sequencing, and psychosocial skills including coping strategies, environmental adaptation, interpersonal interactions, and routines and  behaviors.   IMPAIRMENTS: are limiting patient from ADLs, IADLs, and leisure.   CO-MORBIDITIES: may have co-morbidities  that affects occupational performance. Patient will benefit from skilled OT to address above impairments and improve overall function.  MODIFICATION OR ASSISTANCE TO COMPLETE EVALUATION: Min-Moderate modification of tasks or assist with assess necessary to complete an evaluation.  OT OCCUPATIONAL PROFILE AND HISTORY: Detailed assessment: Review of records and additional review of physical, cognitive, psychosocial history related to current functional performance.  CLINICAL DECISION MAKING: Moderate - several treatment options, min-mod task modification necessary  REHAB POTENTIAL: Good  EVALUATION COMPLEXITY: Moderate    PLAN:  OT FREQUENCY: 1-2x/week  OT DURATION: 6 weeks  PLANNED INTERVENTIONS: 97168 OT Re-evaluation, 97535 self care/ADL training, 16109 therapeutic exercise, 97530 therapeutic activity, 97112 neuromuscular  re-education, functional mobility training, visual/perceptual remediation/compensation, psychosocial skills training, energy conservation, coping strategies training, patient/family education, and DME and/or AE instructions  RECOMMENDED OTHER SERVICES: may benefit from speech therapy  CONSULTED AND AGREED WITH PLAN OF CARE: Patient and family member/caregiver  PLAN FOR NEXT SESSION:  Complete SLUMS at next session Complete Clock drawing test **Discuss with pt about SLP referral for cognition. If pt agreeable, submit request for SLP referral Initiate FM coordination HEP Handouts: Memory compensation strategies, keep thinking skills sharp Handout: energy conservation Complete table top vision task - avoid card games at upcoming sessions per pt request, instead try e.g. puzzles or word cancellation activity Continue to educate on visual compensation during functional tasks   Oakley Bellman, OTR/L 09/06/2023, 1:37 PM   [facility] [address], [address] [place], [place] [address] Phone # [phone] Fax # [phone]

## 2023-09-07 ENCOUNTER — Ambulatory Visit
Admission: RE | Admit: 2023-09-07 | Discharge: 2023-09-07 | Disposition: A | Discharge status: Home / Self Care | Source: Ambulatory Visit | Attending: Radiation Oncology | Admitting: Radiation Oncology

## 2023-09-07 ENCOUNTER — Other Ambulatory Visit: Payer: Self-pay

## 2023-09-07 DIAGNOSIS — C713 Malignant neoplasm of parietal lobe: Secondary | ICD-10-CM | POA: Diagnosis not present

## 2023-09-07 DIAGNOSIS — Z510 Encounter for antineoplastic radiation therapy: Secondary | ICD-10-CM | POA: Diagnosis not present

## 2023-09-07 LAB — RAD ONC ARIA SESSION SUMMARY
Course Elapsed Days: 31
Plan Fractions Treated to Date: 1
Plan Prescribed Dose Per Fraction: 2 Gy
Plan Total Fractions Prescribed: 7
Plan Total Prescribed Dose: 14 Gy
Reference Point Dosage Given to Date: 2 Gy
Reference Point Session Dosage Given: 2 Gy
Session Number: 24

## 2023-09-08 ENCOUNTER — Other Ambulatory Visit: Payer: Self-pay

## 2023-09-08 ENCOUNTER — Ambulatory Visit
Admission: RE | Admit: 2023-09-08 | Discharge: 2023-09-08 | Disposition: A | Discharge status: Home / Self Care | Source: Ambulatory Visit | Attending: Radiation Oncology | Admitting: Radiation Oncology

## 2023-09-08 DIAGNOSIS — C713 Malignant neoplasm of parietal lobe: Secondary | ICD-10-CM | POA: Diagnosis not present

## 2023-09-08 DIAGNOSIS — Z510 Encounter for antineoplastic radiation therapy: Secondary | ICD-10-CM | POA: Diagnosis not present

## 2023-09-08 LAB — RAD ONC ARIA SESSION SUMMARY
Course Elapsed Days: 32
Plan Fractions Treated to Date: 2
Plan Prescribed Dose Per Fraction: 2 Gy
Plan Total Fractions Prescribed: 7
Plan Total Prescribed Dose: 14 Gy
Reference Point Dosage Given to Date: 4 Gy
Reference Point Session Dosage Given: 2 Gy
Session Number: 25

## 2023-09-11 ENCOUNTER — Other Ambulatory Visit: Payer: Self-pay

## 2023-09-11 ENCOUNTER — Ambulatory Visit
Admission: RE | Admit: 2023-09-11 | Discharge: 2023-09-11 | Disposition: A | Discharge status: Home / Self Care | Source: Ambulatory Visit | Attending: Radiation Oncology | Admitting: Radiation Oncology

## 2023-09-11 DIAGNOSIS — Z510 Encounter for antineoplastic radiation therapy: Secondary | ICD-10-CM | POA: Diagnosis not present

## 2023-09-11 DIAGNOSIS — C713 Malignant neoplasm of parietal lobe: Secondary | ICD-10-CM | POA: Diagnosis not present

## 2023-09-11 LAB — RAD ONC ARIA SESSION SUMMARY
Course Elapsed Days: 35
Plan Fractions Treated to Date: 3
Plan Prescribed Dose Per Fraction: 2 Gy
Plan Total Fractions Prescribed: 7
Plan Total Prescribed Dose: 14 Gy
Reference Point Dosage Given to Date: 6 Gy
Reference Point Session Dosage Given: 2 Gy
Session Number: 26

## 2023-09-12 ENCOUNTER — Ambulatory Visit
Admission: RE | Admit: 2023-09-12 | Discharge: 2023-09-12 | Disposition: A | Discharge status: Home / Self Care | Source: Ambulatory Visit | Attending: Radiation Oncology

## 2023-09-12 ENCOUNTER — Other Ambulatory Visit: Payer: Self-pay

## 2023-09-12 DIAGNOSIS — C713 Malignant neoplasm of parietal lobe: Secondary | ICD-10-CM | POA: Diagnosis not present

## 2023-09-12 DIAGNOSIS — Z510 Encounter for antineoplastic radiation therapy: Secondary | ICD-10-CM | POA: Diagnosis not present

## 2023-09-12 LAB — RAD ONC ARIA SESSION SUMMARY
Course Elapsed Days: 36
Plan Fractions Treated to Date: 4
Plan Prescribed Dose Per Fraction: 2 Gy
Plan Total Fractions Prescribed: 7
Plan Total Prescribed Dose: 14 Gy
Reference Point Dosage Given to Date: 8 Gy
Reference Point Session Dosage Given: 2 Gy
Session Number: 27

## 2023-09-13 ENCOUNTER — Ambulatory Visit
Admission: RE | Admit: 2023-09-13 | Discharge: 2023-09-13 | Disposition: A | Discharge status: Home / Self Care | Source: Ambulatory Visit | Attending: Radiation Oncology

## 2023-09-13 ENCOUNTER — Ambulatory Visit: Admitting: Occupational Therapy

## 2023-09-13 ENCOUNTER — Telehealth: Payer: Self-pay | Admitting: Occupational Therapy

## 2023-09-13 ENCOUNTER — Other Ambulatory Visit: Payer: Self-pay

## 2023-09-13 ENCOUNTER — Ambulatory Visit: Attending: Physician Assistant

## 2023-09-13 DIAGNOSIS — R2681 Unsteadiness on feet: Secondary | ICD-10-CM | POA: Insufficient documentation

## 2023-09-13 DIAGNOSIS — R41840 Attention and concentration deficit: Secondary | ICD-10-CM | POA: Insufficient documentation

## 2023-09-13 DIAGNOSIS — M6281 Muscle weakness (generalized): Secondary | ICD-10-CM | POA: Insufficient documentation

## 2023-09-13 DIAGNOSIS — R2689 Other abnormalities of gait and mobility: Secondary | ICD-10-CM | POA: Insufficient documentation

## 2023-09-13 DIAGNOSIS — R278 Other lack of coordination: Secondary | ICD-10-CM | POA: Insufficient documentation

## 2023-09-13 DIAGNOSIS — C713 Malignant neoplasm of parietal lobe: Secondary | ICD-10-CM | POA: Diagnosis not present

## 2023-09-13 DIAGNOSIS — R29818 Other symptoms and signs involving the nervous system: Secondary | ICD-10-CM

## 2023-09-13 DIAGNOSIS — Z510 Encounter for antineoplastic radiation therapy: Secondary | ICD-10-CM | POA: Diagnosis not present

## 2023-09-13 LAB — RAD ONC ARIA SESSION SUMMARY
Course Elapsed Days: 37
Plan Fractions Treated to Date: 5
Plan Prescribed Dose Per Fraction: 2 Gy
Plan Total Fractions Prescribed: 7
Plan Total Prescribed Dose: 14 Gy
Reference Point Dosage Given to Date: 10 Gy
Reference Point Session Dosage Given: 2 Gy
Session Number: 28

## 2023-09-13 NOTE — Telephone Encounter (Signed)
 Javier Graham, Javier Graham was evaluated by occupational and physical therapy on 08/22/23.  During OT sessions we have been focusing on vision, attention, and cognition.  We completed the SLUMS this session with pt scoring 4/30, therefore pt would benefit from Speech therapy evaluation to focus more specifically on impaired cognition, orientation, and memory.   If you agree, please place an order in [facility] in [facility] or fax the order to [phone]. Thank you, Anthonette Kinsman, OTR/L  [facility], [facility] [address] [place], [place]  [address] Phone:  [phone] Fax:  [phone]

## 2023-09-13 NOTE — Therapy (Signed)
 OUTPATIENT OCCUPATIONAL THERAPY NEURO Treatment  Patient Name: Javier Graham MRN: [id number] DOB:Sep 06, 1955, 68 y.o., male Today's Date: 09/13/2023  PCP:  Vallarie Gauze, MD  REFERRING PROVIDER: Rachel Budds, PA  END OF SESSION:  OT End of Session - 09/13/23 0917     Visit Number 4    Number of Visits 13    Date for OT Re-Evaluation 10/06/23    Authorization Type [employer] Medicare - auth submitted    OT Start Time [phone]    OT Stop Time 0931    OT Time Calculation (min) 40 min    Activity Tolerance Patient tolerated treatment well    Behavior During Therapy [facility] for tasks assessed/performed                Past Medical History:  Diagnosis Date   Diabetes (HCC)    Diplopia    Headache    Past Surgical History:  Procedure Laterality Date   BUNIONECTOMY     Patient Active Problem List   Diagnosis Date Noted   Glioblastoma, IDH-wildtype (HCC) 07/27/2023   Diplopia 12/19/2018   Stenosis of right vertebral artery 12/19/2018   Diabetes (HCC) 06/19/2016   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  Pt reports having less trouble when finding his way through and using visual input to aid in his mobility.  From eval: The patient presented to ED due to difficulties with driving navigation and confusion. He was found to have a large R parietal lobe mass that was resected at [facility] on 07/05/23 and pathology is consistent with glioblastoma.   Pt accompanied by: self and significant other(wife-[name] in waiting room)  PERTINENT HISTORY:  diabetes, h/o HA and h/o diplopia   PRECAUTIONS: Fall and Other: s/p surgery did have restriction of no lifting > 15# , L visual field cut  WEIGHT BEARING RESTRICTIONS: No  PAIN:  Are you having pain? No  FALLS: Has patient fallen in last 6 months? Yes. Number of falls 1, fell in February and hit his eye went to ED  LIVING ENVIRONMENT: Lives with: lives with their spouse Lives in: House/apartment Stairs: Yes: Internal: bedroom is on main level, but does have a flight of steps to downstairs "rec room" and second floor which he rarely goes up steps; handrail and External: 1 steps in from garage Has following equipment at home: Walker - 4 wheeled, Grab bars, and built in bench in shower that pt has used s/p surgery  PLOF: Independent and Independent with basic ADLs; retired, spends time with grandkids (10 and 17 yo)  PATIENT GOALS: to be able to drive again; to be able to do things with the grandchildren  OBJECTIVE:  Note: Objective measures were completed at Evaluation unless otherwise noted.  HAND DOMINANCE: Right  ADLs: Overall ADLs: spouse reports pt will frequently put clothing on backwards Transfers/ambulation related to ADLs: Eating: pt will occasionally not see items on L during eating, has broken glasses that were on L side as he did not see them LB Dressing: occasional increased time to problem solve tying shoes Toileting: Mod I Bathing: Mod  I Tub Shower transfers: Mod I Equipment: Grab bars and Walk in shower  IADLs: Light housekeeping: he helps make the bed and dries dishes, takes out the garbage independently Meal Prep: Intermittent supervision as pt grilled steaks, pt is not primary cook.   Community mobility: not cleared to drive  Medication management: did not ask due to time constraints  MOBILITY STATUS:  somewhat guarded due to L peripheral vision impairment  POSTURE COMMENTS:  No Significant postural limitations  ACTIVITY TOLERANCE: Activity tolerance:  decreased endurance  FUNCTIONAL OUTCOME MEASURES: Physical performance test: not completed this session due to time constraints and pt with decreased awareness of deficits* 9 hole peg test: 9 Hole Peg test: Right: 46.85 sec; Left: 65.87 sec; pt demonstrating decreased recall of instructions and decreased awareness as picking up multiple pegs on L and requiring cues to remove pegs after placement bilaterally   UPPER EXTREMITY ROM:  WFL bilaterally  UPPER EXTREMITY MMT:   grossly 5/5 bilaterally  COORDINATION: 9 Hole Peg test: Right: 46.85 sec; Left: 65.87 sec - pt demonstrating decreased recall of instructions and decreased awareness as picking up multiple pegs on L and requiring cues to remove pegs after placement bilaterally  Rapid alternating movements and FTN are equal and symmetric bilaterally   SENSATION: WFL on eval; however may need to further assess functionally as pt with initial concerns for L sensory impairment on chart review  COGNITION: Overall cognitive status:  impaired, with memory changes  VISION: Subjective report: L neglect   VISION ASSESSMENT: To be further assessed in functional context Tracking/Visual pursuits: Decreased smoothness with horizontal tracking and difficulty to assess due to difficulty with following commands Visual Fields: Left visual field deficits - pt reports seeing movement in L peripheral vision prior to recognition of stimulus at 30*  PERCEPTION: Impaired: Inattention/neglect: does not attend to left visual field  OBSERVATIONS: Pleasant gentleman with supportive wife who is able to provide additional information as pt with decreased awareness of impairments and impact of impairments on safety and independence.                                                                                                                             TREATMENT DATE:  09/13/23 Mobility/vision: pt with decreased visual scanning to R and L when approaching mat  table to sit on, pt nearly overshooting and missing.  OT providing cues and CGA during navigation throughout gym space and approaching mat table and providing cues to turn head and scan to locate mat table.  At end of session, pt also nearly overshooting chair, benefiting from cues to turn head to L and reach for back of chair before sitting to increase safety when sitting. Cognition:  Completed SLUMs with pt scoring 4/30, dementia level, with pt demonstrating decreased orientation to day or year; no recall of 5 words after delay, no recall of information from short story, and difficulty with recall and mental math.  Pt demonstrating decreased  spatial awareness and sequencing with clock drawing.  Pt demonstrating strengths in number sequencing and size and shape with figures.  Due to score on SLUMS and continued cognitive issues, pt would benefit from SLP eval.  Pt and spouse in agreement. Vision/attention: attempted simple horizontal word finding puzzle with anchor to L to facilitate visual scanning fully to L of page.  Pt with stray markings on paper but with no words circled.  Pt stating "it's not overly difficult, but I've given up".  OT educating on other pen and paper visual scanning "worksheets" or app based games to continue to focus on vision and attention.  Provided with handout (see pt instructions) Memory: educated on memory compensation strategies with largest focus on writing things down, use of calendar, and alarms/reminders.  OT providing additional suggestions with coloring coding or use of reminders on phone to improve recall and carryover of strategies.  Handout provided and handed to spouse in waiting area.  PATIENT EDUCATION: Education details: see today's tx above Person educated: Patient and Spouse Education method: Explanation Education comprehension: needs further education  HOME EXERCISE PROGRAM: 08/30/23 - visual scanning (see pt instructions)   GOALS: Goals reviewed with  patient? Yes  SHORT TERM GOALS: Target date: 09/15/23  Pt will be independent in coordination HEP with use of visual handouts. Baseline: decreased coordination bilaterally on 9 hole peg test Goal status: in progress  2.  Pt will verbalize understanding with safety considerations and AE needs to ensure safety d/t decreased sensation.  Baseline: diminished sensation per chart review Goal status: in progress  3.  Pt will demonstrate understanding of memory compensations and ways to keep thinking skills sharp. Baseline: decreased memory for use of visual compensation Goal status: in progress - initiated during 09/13/23 session  4.  Pt will verbalize understanding of energy conservation strategies to increase safety with ADLs and IADLs and report 2 in use to increase endurance as needed to complete leisure tasks (to include playing and hiking with grandchildren). Baseline: decreased endurance Goal status: in progress  LONG TERM GOALS: Target date: 10/06/23  Pt will verbalize understanding of task modifications and/or potential DME/AE needs to increase ease, safety, and independence w/ ADLs.  Baseline: decreased endurance, donning clothing backwards with decreased awareness Goal status: INITIAL  2.  Pt will complete table top scanning activity with Supervision. Baseline: L peripheral impairment Goal status: INITIAL  3.  Pt will navigate a moderately busy environment, following multi-step commands with 90% accuracy Baseline: difficulty with dual tasking, recall of instructions/compensatory strategies Goal status: INITIAL  4.  Pt will demonstrate ability to sequence simple functional task/IADL (simple snack prep, laundry task, etc) at Mod I level with good safety awareness Baseline: decreased sequence and recall of instructions/compensatory strategies Goal status: INITIAL   ASSESSMENT:  CLINICAL IMPRESSION: Pt tolerated tasks fairly well though continues to demo significant difficulty  with cognitive components of task and recall of a task's purpose/goal as evidenced by pt requiring frequent cues. Pt verbalizing and demonstrating, with body posture, decreased engagement in tasks as verbalizing disinterest in word search activity.  Pt's spouse also verbalizing that pt with decreased interest and desire to continue with therapy services, despite pt in agreement to request for SLP referral.  Pt would continue to benefit from review of visual scanning strategies during functional tasks to improve safety and attention to affected side. Pt demonstrating L inattention during mobility and table top tasks.  Pt will continue to benefit from skilled occupational therapy services to address coordination,  pain management, altered sensation, balance, GM/FM control, cognition, safety awareness, introduction of compensatory strategies/AE prn, visual-perception, and implementation of an HEP to improve participation and safety during ADLs and IADLs.    PERFORMANCE DEFICITS: in functional skills including ADLs, IADLs, coordination, proprioception, sensation, pain, Fine motor control, Gross motor control, body mechanics, endurance, decreased knowledge of precautions, decreased knowledge of use of DME, vision, and UE functional use, cognitive skills including attention, memory, problem solving, safety awareness, and sequencing, and psychosocial skills including coping strategies, environmental adaptation, interpersonal interactions, and routines and behaviors.    PLAN:  OT FREQUENCY: 1-2x/week  OT DURATION: 6 weeks  PLANNED INTERVENTIONS: 97168 OT Re-evaluation, 97535 self care/ADL training, 47829 therapeutic exercise, 97530 therapeutic activity, 97112 neuromuscular re-education, functional mobility training, visual/perceptual remediation/compensation, psychosocial skills training, energy conservation, coping strategies training, patient/family education, and DME and/or AE instructions  RECOMMENDED OTHER  SERVICES: may benefit from speech therapy  CONSULTED AND AGREED WITH PLAN OF CARE: Patient and family member/caregiver  PLAN FOR NEXT SESSION:   Initiate FM coordination HEP Handouts: Memory compensation strategies, keep thinking skills sharp - review Handout: energy conservation Complete table top vision task - avoid card games at upcoming sessions per pt request, instead try e.g. puzzles or word cancellation activity Continue to educate on visual compensation during functional tasks   Anthonette Kinsman, OTR/L 09/13/2023, 9:17 AM   [facility] at [facility] [address] [place], [place] [address] Phone # [phone] Fax # [phone]

## 2023-09-13 NOTE — Patient Instructions (Signed)
 Memory Compensation Strategies  Use "WARM" strategy. W= write it down A=  associate it R=  repeat it M=  make a mental picture  You can keep a Memory Notebook. Use a 3-ring notebook with sections for the following:  calendar, important names and phone numbers, medications, doctors' names/phone numbers, "to do list"/reminders, and a section to journal what you did each day  Use a calendar to write appointments down.  Write yourself a schedule for the day.  This can be placed on the calendar or in a separate section of the Memory Notebook.  Keeping a regular schedule can help memory.  Use medication organizer with sections for each day or morning/evening pills  You may need help loading it  Keep a basket, or pegboard by the door.   Place items that you need to take out with you in the basket or on the pegboard.  You may also want to include a message board for reminders.  Use sticky notes. Place sticky notes with reminders in a place where the task is performed.  For example:  "turn off the stove" placed by the stove, "lock the door" placed on the door at eye level, "take your medications" on the bathroom mirror or by the place where you normally take your medications  Use alarms, timers, and/or a reminder app. Use while cooking to remind yourself to check on food or as a reminder to take your medicine, or as a reminder to make a call, or as a reminder to perform another task, etc.  Use a voice recorder app or small tape recorder to record important information and notes for yourself. Go back at the end of the day and listen to these.  Keeping Thinking Skills Sharp:  1. Jigsaw puzzles  2. Card/board games  3. Talking on the phone/social events  4. Lum[employer].com  5. Online games  6. Word serches/crossword puzzles  7.  Logic puzzles  8. Aerobic exercise (stationary bike)  9. Eating balanced diet (fruits & veggies)  10. Drink water  11. Try something new--new recipe,  hobby  12. Crafts  13. Do a variety of activities that are challenging  14.  Plan weekly meals and write a grocery list  15. Add cognitive activities to walking/exercising (think of animal/food/city with each letter of the alphabet, counting backwards, thinking of as many vegetables as you can, etc.).--Only do this  If safe (no freezing/falls).

## 2023-09-13 NOTE — Therapy (Signed)
 OUTPATIENT PHYSICAL THERAPY NEURO TREATMENT   Patient Name: Javier Graham MRN: [id number] DOB:1955/11/17, 68 y.o., male Today's Date: 09/13/2023   PCP: Vallarie Gauze, MD REFERRING PROVIDER: Rheba Cedar, PA  END OF SESSION:  PT End of Session - 09/13/23 0936     Visit Number 4    Number of Visits 16    Date for PT Re-Evaluation 10/06/23    Authorization Type [employer]HC Medicare    PT Start Time 0930    PT Stop Time 1015    PT Time Calculation (min) 45 min    Activity Tolerance Patient tolerated treatment well    Behavior During Therapy [facility] for tasks assessed/performed            Past Medical History:  Diagnosis Date   Diabetes (HCC)    Diplopia    Headache    Past Surgical History:  Procedure Laterality Date   BUNIONECTOMY     Patient Active Problem List   Diagnosis Date Noted   Glioblastoma, IDH-wildtype (HCC) 07/27/2023   Diplopia 12/19/2018   Stenosis of right vertebral artery 12/19/2018   Diabetes (HCC) 06/19/2016   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   High ankle sprain 10/17/2012   Leg pain 10/17/2012   ONSET DATE: 07/05/23 REFERRING DIAG:  Diagnosis  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  Reports he has gone on the hiking trail and was able to walk without discomfort Pt accompanied by: self  PERTINENT HISTORY: diabetes, h/o HA and h/o diplopia  PAIN:  Are you having pain? Yes; can get HA and low back pain. Also notes an  achiness in thighs when beginning activities that is new with current medical issues.  PRECAUTIONS: Fall  WEIGHT BEARING RESTRICTIONS: No  FALLS: Has patient fallen in last 6 months? Yes. Number of falls 1  one fall while getting out of bed in the middle of the night  LIVING ENVIRONMENT: Lives with: lives with their family Lives in: House/apartment Stairs: Yes: Internal: 12-14 steps to top floor and to basement, able to live on main floor steps; handrail Has following equipment at home: None  PLOF: Independent  spends time with grandkids  PATIENT GOALS: I want to be able to go out and hike with grandkids without limitations (49 and 57 years old)  OBJECTIVE:   TODAY'S TREATMENT: 09/13/23 Activity Comments  Retrowalk/tandem forward 2x50 ft CGA due to unsteadiness, difficulty with hip extension LLE for retro and difficulty coming to midline with tandem  Sit-stand alternate knee tap 1x10 CGA and cues for sequence  Multisensory balance   Heel-toe rock in standing 2x10 Cues for sequence--decreased left ankle control/ROM               PATIENT EDUCATION: Education details: HEP initiation, bungee leader for walking with spouse Person educated: Patient and Spouse Education method: Explanation, Facilities manager, and Handouts Education comprehension: verbalized understanding, returned demonstration, and needs further education  HOME EXERCISE PROGRAM: Access Code: [email or web address] URL: [email or web address] Date: 08/30/2023 Prepared by: Kelly Milayna Rotenberg  Program Notes sit to stand with cross-body alternate knee taps 3x10   Exercises - Seated Reaching to Side and Across Body  - 1 x daily - 7 x weekly - 3 sets - 10 reps __________________________________________________________________________________________________________ Note: Objective measures were completed at Evaluation unless otherwise noted.  DIAGNOSTIC FINDINGS: reviewed in chart  COGNITION: Overall cognitive status:   impaired with memory changes   SENSATION: WFL  COORDINATION: Rapid alternating movements, FTN, and HTS are equal and symmetric bilaterally  MUSCLE TONE: WNLs  POSTURE: No Significant postural limitations  LOWER EXTREMITY ROM:    WFLs per observation t/o evaluation  LOWER EXTREMITY MMT:  WFLs for all activities demonstrating functional squat to the ground and return to stand.  BED MOBILITY:  Findings: independent  TRANSFERS: Sit to stand: Complete Independence  Assistive device utilized: None      STAIRS: Patient requires CGA due to catching his toes on the edge of steps x 4 steps  GAIT: Device: None Assist: Patient needs supervision due to visual deficits with L visual field cut and possible inattention/neglect. Characteristics:  Gait is characterized by slower speeds, mostly due to navigating obstacles in clinic Gait speed 2.44 ft/sec  FUNCTIONAL TESTS:  Berg=50/56 FGA=22/30    PATIENT SURVEYS:  Planned to use patient specific activity score, however, due to memory and awareness did not ask.    __________________________________________________________________________________________________________ GOALS: Goals reviewed with patient? Yes  SHORT TERM GOALS: Target date: 09/21/23  The patient will perform HEP with cues/reminders from spouse. Baseline: initiated at eval Goal status: INITIAL  2.  The patient will maintain single limb standing x 10 seconds R and L sides.  Baseline:  Holds 3-4 seconds on each LE Goal status: INITIAL  LONG TERM GOALS: Target date: 10/06/23  The patient will perform progression of HEP with assist from family as needed. Baseline:  initiated at eval Goal status: INITIAL  2.  The patient will improve FGA to > or equal to 24/30. Baseline: 22/30 *anticipate that visual deficits will lead to continued need for supervision during some tasks Goal status: INITIAL  3.  The patient will report resolution of thigh discomfort with  extended ambulation > 30 minutes. Baseline:  notes fatigue and achiness in bilateral quads Goal status: INITIAL  4.  The patient will demonstrate floor<>stand due to h/o 1 fall to ensure ability to safety stand with UE support through floor. Baseline:  due to h/o fall Goal status: INITIAL  ASSESSMENT:  CLINICAL IMPRESSION: Dynamic balance activities to promote LLE awareness/weight acceptance/motor control with difficulty achieving symmetric left hip extension in retro-walk and difficulty bringing LLE to midline in tandem walk. Activities to  promote crossing midline and left attention for improved visual scanning/awareness with cues for sequence.  Multisensory balance demands to promote postural stability/proprioceptive awareness. Heel-toe rocking in standing to facilitate ankle strategy and weight shifting with reduced amplitude movement LLE.  Continued sessions to progress POC details to improve strategies for motor control/spatial awareness to enable improved mobility and reduce risk for falls.   OBJECTIVE IMPAIRMENTS: Abnormal gait, decreased balance, decreased safety awareness, impaired vision/preception, and pain.   ACTIVITY LIMITATIONS: locomotion level  PARTICIPATION LIMITATIONS: community activity and yard work  PERSONAL FACTORS: 1-2 comorbidities: diabetes, glioblastoma  are also affecting patient's functional outcome.   REHAB POTENTIAL: Fair due to nature of condition  CLINICAL DECISION MAKING: Evolving/moderate complexity  EVALUATION COMPLEXITY: Moderate  PLAN:  PT FREQUENCY: 2x/week  PT DURATION: 8 weeks  PLANNED INTERVENTIONS: 97164- PT Re-evaluation, 97110-Therapeutic exercises, 97530- Therapeutic activity, 97112- Neuromuscular re-education, 97535- Self Care, 16109- Manual therapy, [phone]- Gait training, [phone]- Aquatic Therapy, Patient/Family education, Balance training, Stair training, and Visual/preceptual remediation/compensation  PLAN FOR NEXT SESSION: Add HEP  considering single limb stance near support on R side, standing with head motions or controlled 1/2 turns to work on repetition for scanning environment due to visual deficits, discuss continued outdoor walking with supervision, and trial coordination activities for clearing obstacles and determine if safe for home. Focus on foot clearance near obstacles, scanning environment in therapy with cards/visual cues. Engage spouse with HEP due to potentially limited carryover.   9:36 AM, 09/13/23 M. Kelly Clarrisa Kaylor, PT, DPT Physical Therapist- [place] Office Number: [phone]

## 2023-09-14 ENCOUNTER — Other Ambulatory Visit: Payer: Self-pay

## 2023-09-14 ENCOUNTER — Inpatient Hospital Stay: Admitting: Internal Medicine

## 2023-09-14 ENCOUNTER — Ambulatory Visit
Admission: RE | Admit: 2023-09-14 | Discharge: 2023-09-14 | Disposition: A | Discharge status: Home / Self Care | Source: Ambulatory Visit | Attending: Radiation Oncology | Admitting: Radiation Oncology

## 2023-09-14 ENCOUNTER — Telehealth: Payer: Self-pay | Admitting: Internal Medicine

## 2023-09-14 ENCOUNTER — Inpatient Hospital Stay

## 2023-09-14 VITALS — BP 109/66 | HR 88 | Temp 97.5°F | Resp 17 | Ht 73.0 in | Wt 206.5 lb

## 2023-09-14 DIAGNOSIS — C713 Malignant neoplasm of parietal lobe: Secondary | ICD-10-CM | POA: Diagnosis not present

## 2023-09-14 DIAGNOSIS — Z510 Encounter for antineoplastic radiation therapy: Secondary | ICD-10-CM | POA: Diagnosis not present

## 2023-09-14 DIAGNOSIS — C719 Malignant neoplasm of brain, unspecified: Secondary | ICD-10-CM

## 2023-09-14 LAB — CMP (CANCER CENTER ONLY)
ALT: 11 U/L (ref 0–44)
AST: 13 U/L — ABNORMAL LOW (ref 15–41)
Albumin: 4.6 g/dL (ref 3.5–5.0)
Alkaline Phosphatase: 59 U/L (ref 38–126)
Anion gap: 8 (ref 5–15)
BUN: 16 mg/dL (ref 8–23)
CO2: 25 mmol/L (ref 22–32)
Calcium: 9.7 mg/dL (ref 8.9–10.3)
Chloride: 107 mmol/L (ref 98–111)
Creatinine: 1.04 mg/dL (ref 0.61–1.24)
GFR, Estimated: >60 mL/min (ref >60)
Glucose, Bld: 174 mg/dL — ABNORMAL HIGH (ref 70–99)
Potassium: 4.6 mmol/L (ref 3.5–5.1)
Sodium: 140 mmol/L (ref 135–145)
Total Bilirubin: 0.7 mg/dL (ref 0.0–1.2)
Total Protein: 7.3 g/dL (ref 6.5–8.1)

## 2023-09-14 LAB — RAD ONC ARIA SESSION SUMMARY
Course Elapsed Days: 38
Plan Fractions Treated to Date: 6
Plan Prescribed Dose Per Fraction: 2 Gy
Plan Total Fractions Prescribed: 7
Plan Total Prescribed Dose: 14 Gy
Reference Point Dosage Given to Date: 12 Gy
Reference Point Session Dosage Given: 2 Gy
Session Number: 29

## 2023-09-14 LAB — CBC WITH DIFFERENTIAL (CANCER CENTER ONLY)
Abs Immature Granulocytes: 0.12 10*3/uL — ABNORMAL HIGH (ref 0.00–0.07)
Basophils Absolute: 0 10*3/uL (ref 0.0–0.1)
Basophils Relative: 0 %
Eosinophils Absolute: 0.1 10*3/uL (ref 0.0–0.5)
Eosinophils Relative: 1 %
HCT: 42.1 % (ref 39.0–52.0)
Hemoglobin: 14.7 g/dL (ref 13.0–17.0)
Immature Granulocytes: 1 %
Lymphocytes Relative: 5 %
Lymphs Abs: 0.7 10*3/uL (ref 0.7–4.0)
MCH: 30.8 pg (ref 26.0–34.0)
MCHC: 34.9 g/dL (ref 30.0–36.0)
MCV: 88.1 fL (ref 80.0–100.0)
Monocytes Absolute: 0.6 10*3/uL (ref 0.1–1.0)
Monocytes Relative: 5 %
Neutro Abs: 10.7 10*3/uL — ABNORMAL HIGH (ref 1.7–7.7)
Neutrophils Relative %: 88 %
Platelet Count: 203 10*3/uL (ref 150–400)
RBC: 4.78 MIL/uL (ref 4.22–5.81)
RDW: 13.2 % (ref 11.5–15.5)
WBC Count: 12.2 10*3/uL — ABNORMAL HIGH (ref 4.0–10.5)
nRBC: 0 % (ref 0.0–0.2)

## 2023-09-14 NOTE — Telephone Encounter (Signed)
 Patient scheduled appointments. Patient is aware of all appointment details.

## 2023-09-14 NOTE — Progress Notes (Signed)
 [facility] at [facility] [address]  [place], [place] [address] [phone]   Interval Evaluation  Date of Service: 09/14/23 Patient Name: Javier Graham Patient MRN: [id number] Patient DOB: 13-Aug-1955 Provider: Mamie Searles, MD  Identifying Statement:  Javier Graham is a 68 y.o. male with right parietal glioblastoma    Oncologic History: Oncology History  Glioblastoma, IDH-wildtype (HCC)  07/05/2023 Surgery   Craniotomy, resection at [facility] with Dr. Lydia Sams; path is glioblastoma   08/03/2023 -  Chemotherapy   Patient is on Treatment Plan : BRAIN GLIOBLASTOMA Radiation Therapy With Concurrent Temozolomide  75 mg/m2 Daily Followed By Sequential Maintenance Temozolomide  x 6-12 cycles       Biomarkers:  MGMT Unknown.  IDH 1/2 Wild type.  EGFR Unknown  TERT Unknown   Interval History: Javier Graham presents today for follow up, now having completed 6 weeks of radiation and Temodar .  No new or progressive changes, although he has been having bouts of nausea some mornings.  Does describe ongoing fatigue.  Wife notes issues with memory, including not remembering her identity at times.  Denies seizures, headaches.    H+P (07/27/23) Patient presents to establish care locally for his new brain tumor.  He and his wife describe issues with driving navigation, some confusion which led to his assessment in the ED.  CNS imaging demonstrated a large enhancing mass within the right parietal lobe, c/w primary brain tumor.  He underwent surgery at [facility] on 2/26 with Dr. Lydia Sams; path demonstrated glioblastoma IDHwt.  Following surgery he has noticed some issues with navigating space and finding certain words.  No longer dosing Keppra or decadron.  Medications: Current Outpatient Medications on File Prior to Visit  Medication Sig Dispense Refill   ascorbic acid (VITAMIN C) 500 MG tablet Take 500 mg by mouth daily.     aspirin EC 81 MG tablet Take 81 mg by mouth daily.      Blood Glucose Monitoring Suppl (ACCU-CHEK GUIDE) w/Device KIT USE AS DIRECTED TO CHECK BLOOD GLUCOSE     clopidogrel  (PLAVIX ) 75 MG tablet Take 1 tablet (75 mg total) by mouth daily. 30 tablet 11   dapagliflozin  propanediol (FARXIGA ) 5 MG TABS tablet Take 5 mg by mouth daily. 30 tablet 11   glimepiride  (AMARYL ) 4 MG tablet Take 1 tablet (4 mg total) by mouth daily before breakfast. 30 tablet 3   lisinopril (ZESTRIL) 5 MG tablet Take 5 mg by mouth daily.     Melatonin 5 MG TABS Take 2 tablets by mouth as needed.     metFORMIN (GLUMETZA) 500 MG (MOD) 24 hr tablet Take 500 mg by mouth. Take 2 tabs twice daily     ondansetron  (ZOFRAN ) 8 MG tablet Take 1 tablet (8 mg total) by mouth every 8 (eight) hours as needed for nausea or vomiting. May take 30-60 minutes prior to Temodar  administration if nausea/vomiting occurs as needed. 30 tablet 1   polyethylene glycol powder (GLYCOLAX/MIRALAX) 17 GM/SCOOP powder Take by mouth daily as needed.     Sennosides (SENOKOT PO) Take 1 tablet by mouth daily as needed.     temozolomide  (TEMODAR ) 140 MG capsule Take 1 capsule (140 mg total) by mouth daily. (Take with one 20 mg capsule for total daily dose of 160 mg). May take on an empty stomach to decrease nausea & vomiting. 42 capsule 0   temozolomide  (TEMODAR ) 20 MG capsule Take 1 capsule (20 mg total) by mouth daily. (Take with one 140  mg capsule for total daily dose of 160 mg). May take on an empty stomach to decrease nausea & vomiting. 42 capsule 0   VICTOZA  18 MG/3ML SOPN INJECT 0.3 ML'S (1.8 MG TOTAL) INTO THE SKIN DAILY 9 pen 3   No current facility-administered medications on file prior to visit.    Allergies: No Known Allergies Past Medical History:  Past Medical History:  Diagnosis Date   Diabetes (HCC)    Diplopia    Headache    Past Surgical History:  Past Surgical History:  Procedure Laterality Date   BUNIONECTOMY     Social History:  Social History   Socioeconomic History   Marital  status: Married    Spouse name: Not on file   Number of children: 3   Years of education: college   Highest education level: Master's degree (e.g., MA, MS, MEng, MEd, MSW, MBA)  Occupational History   Not on file  Tobacco Use   Smoking status: Never   Smokeless tobacco: Never  Substance and Sexual Activity   Alcohol use: No    Comment: none since 1996   Drug use: No   Sexual activity: Not on file  Other Topics Concern   Not on file  Social History Narrative   Lives at home with his wife.   Right-handed.   One can of Coke Zero per day.   Social Drivers of Corporate investment banker Strain: High Risk (07/10/2023)   Received from [facility]   Overall Financial Resource Strain (CARDIA)    Difficulty of Paying Living Expenses: Hard  Food Insecurity: No Food Insecurity (07/27/2023)   Hunger Vital Sign    Worried About Running Out of Food in the Last Year: Never true    Ran Out of Food in the Last Year: Never true  Transportation Needs: No Transportation Needs (07/10/2023)   Received from [facility] - Transportation    In the past 12 months, has lack of transportation kept you from medical appointments or from getting medications?: No    Lack of Transportation (Non-Medical): No  Physical Activity: Not on file  Stress: Not on file  Social Connections: Not on file  Intimate Partner Violence: Not on file   Family History:  Family History  Problem Relation Age of Onset   Heart disease Other    Diabetes Other    Diabetes Father    Stroke Father        x 2   Heart disease Father    Diabetes Sister    Diabetes Brother    Heart disease Mother     Review of Systems: Constitutional: Doesn't report fevers, chills or abnormal weight loss Eyes: Doesn't report blurriness of vision Ears, nose, mouth, throat, and face: Doesn't report sore throat Respiratory: Doesn't report cough, dyspnea or wheezes Cardiovascular: Doesn't report  palpitation, chest discomfort  Gastrointestinal:  Doesn't report nausea, constipation, diarrhea GU: Doesn't report incontinence Skin: Doesn't report skin rashes Neurological: Per HPI Musculoskeletal: Doesn't report joint pain Behavioral/Psych: Doesn't report anxiety  Physical Exam: Vitals:   09/14/23 0852  BP: 109/66  Pulse: 88  Resp: 17  Temp: (!) 97.5 F (36.4 C)  SpO2: 100%   KPS: 80. General: Alert, cooperative, pleasant, in no acute distress Head: Normal EENT: No conjunctival injection or scleral icterus.  Lungs: Resp effort normal Cardiac: Regular rate Abdomen: Non-distended abdomen Skin: No rashes cyanosis or petechiae. Extremities: No clubbing or edema  Neurologic Exam: Mental Status:  Awake, alert, attentive to examiner. Oriented to self and environment. Language is fluent with intact comprehension.  Cranial Nerves: Visual acuity is grossly normal. Visual fields are full. Extra-ocular movements intact. No ptosis. Face is symmetric Motor: Tone and bulk are normal. Power is full in both arms and legs. Reflexes are symmetric, no pathologic reflexes present.  Sensory: Intact to light touch Gait: Normal.   Labs: I have reviewed the data as listed    Component Value Date/Time   NA 140 09/14/2023 0749   NA 139 12/19/2018 0858   K 4.6 09/14/2023 0749   CL 107 09/14/2023 0749   CO2 25 09/14/2023 0749   GLUCOSE 174 (H) 09/14/2023 0749   BUN 16 09/14/2023 0749   BUN 14 12/19/2018 0858   CREATININE 1.04 09/14/2023 0749   CALCIUM 9.7 09/14/2023 0749   PROT 7.3 09/14/2023 0749   PROT 6.8 12/19/2018 0858   ALBUMIN 4.6 09/14/2023 0749   ALBUMIN 4.6 12/19/2018 0858   AST 13 (L) 09/14/2023 0749   ALT 11 09/14/2023 0749   ALKPHOS 59 09/14/2023 0749   BILITOT 0.7 09/14/2023 0749   GFRNONAA >60 09/14/2023 0749   GFRAA 90 12/19/2018 0858   Lab Results  Component Value Date   WBC 12.2 (H) 09/14/2023   NEUTROABS 10.7 (H) 09/14/2023   HGB 14.7 09/14/2023   HCT 42.1  09/14/2023   MCV 88.1 09/14/2023   PLT 203 09/14/2023     Assessment/Plan Glioblastoma, IDH-wildtype (HCC)  Javier Graham is clinically stable today, now having completed 6 weeks of IMRT and concurrent Temodar .  Labs are within normal limits.   We ultimately recommended completing course of intensity modulated radiation therapy and concurrent daily Temozolomide .  Radiation will be administered Mon-Fri over 6 weeks, Temodar  will be dosed at 75mg /m2 to be given daily over 42 days.  We reviewed side effects of temodar , including fatigue, nausea/vomiting, constipation, and cytopenias.  Informed consent was verbally obtained at bedside to proceed with oral chemotherapy.  Chemotherapy should be held for the following:  ANC less than 1,000  Platelets less than 100,000  LFT or creatinine greater than 2x ULN  If clinical concerns/contraindications develop  We ask that Javier Graham return to clinic in 1 months following post-RT brain MRI, or sooner as needed.  All questions were answered. The patient knows to call the clinic with any problems, questions or concerns. No barriers to learning were detected.  The total time spent in the encounter was 30 minutes and more than 50% was on counseling and review of test results   Mamie Searles, MD Medical Director of Neuro-Oncology [facility] at [facility] 09/14/23 9:07 AM

## 2023-09-15 ENCOUNTER — Ambulatory Visit
Admission: RE | Admit: 2023-09-15 | Discharge: 2023-09-15 | Disposition: A | Discharge status: Home / Self Care | Source: Ambulatory Visit | Attending: Radiation Oncology | Admitting: Radiation Oncology

## 2023-09-15 ENCOUNTER — Ambulatory Visit
Admission: RE | Admit: 2023-09-15 | Discharge: 2023-09-15 | Disposition: A | Discharge status: Home / Self Care | Source: Ambulatory Visit | Attending: Radiation Oncology

## 2023-09-15 ENCOUNTER — Other Ambulatory Visit: Payer: Self-pay

## 2023-09-15 DIAGNOSIS — Z510 Encounter for antineoplastic radiation therapy: Secondary | ICD-10-CM | POA: Diagnosis not present

## 2023-09-15 DIAGNOSIS — C713 Malignant neoplasm of parietal lobe: Secondary | ICD-10-CM | POA: Diagnosis not present

## 2023-09-15 LAB — RAD ONC ARIA SESSION SUMMARY
Course Elapsed Days: 39
Plan Fractions Treated to Date: 7
Plan Prescribed Dose Per Fraction: 2 Gy
Plan Total Fractions Prescribed: 7
Plan Total Prescribed Dose: 14 Gy
Reference Point Dosage Given to Date: 14 Gy
Reference Point Session Dosage Given: 2 Gy
Session Number: 30

## 2023-09-18 ENCOUNTER — Other Ambulatory Visit

## 2023-09-18 ENCOUNTER — Ambulatory Visit: Admitting: Internal Medicine

## 2023-09-18 NOTE — Radiation Completion Notes (Signed)
  Radiation Oncology         [phone] ________________________________  Name: Javier Graham MRN: [id number]  Date of Service: 09/15/2023  DOB: Sep 20, 1955  End of Treatment Note  Diagnosis:  Glioblastoma, IDH-wildtype of the right parietal lobe  Intent: Curative     ==========DELIVERED PLANS==========  First Treatment Date: 2023-08-07 Last Treatment Date: 2023-09-15   Plan Name: Brain_R Site: Brain Technique: IMRT Mode: Photon Dose Per Fraction: 2 Gy Prescribed Dose (Delivered / Prescribed): 46 Gy / 46 Gy Prescribed Fxs (Delivered / Prescribed): 23 / 23   Plan Name: Brain_R_Bst Site: Brain Technique: IMRT Mode: Photon Dose Per Fraction: 2 Gy Prescribed Dose (Delivered / Prescribed): 14 Gy / 14 Gy Prescribed Fxs (Delivered / Prescribed): 7 / 7     ==========ON TREATMENT VISIT DATES========== 2023-08-11, 2023-08-18, 2023-08-25, 2023-09-01, 2023-09-08, 2023-09-15    See weekly On Treatment Notes in Epic for details in the Media tab (listed as Progress notes on the On Treatment Visit Dates listed above). The patient tolerated radiation. He developed fatigue and nausea during therapy and anticipated skin changes in the treatment field.   The patient will receive a call in about one month from the radiation oncology department. He will continue follow up with Dr. Mark Sil as well.      Shelvia Dick, PAC

## 2023-09-20 ENCOUNTER — Ambulatory Visit

## 2023-09-20 ENCOUNTER — Ambulatory Visit: Admitting: Occupational Therapy

## 2023-09-20 VITALS — BP 130/88 | HR 97

## 2023-09-20 DIAGNOSIS — R2689 Other abnormalities of gait and mobility: Secondary | ICD-10-CM | POA: Diagnosis not present

## 2023-09-20 DIAGNOSIS — R29818 Other symptoms and signs involving the nervous system: Secondary | ICD-10-CM

## 2023-09-20 DIAGNOSIS — R278 Other lack of coordination: Secondary | ICD-10-CM

## 2023-09-20 DIAGNOSIS — R2681 Unsteadiness on feet: Secondary | ICD-10-CM

## 2023-09-20 DIAGNOSIS — R41840 Attention and concentration deficit: Secondary | ICD-10-CM

## 2023-09-20 DIAGNOSIS — M6281 Muscle weakness (generalized): Secondary | ICD-10-CM | POA: Diagnosis not present

## 2023-09-20 NOTE — Patient Instructions (Addendum)
   Adjustment: #6 - Place 1 golf ball in fingertips and try to roll back and forth.  Adjustment: #7 - Pen Translation removed.

## 2023-09-20 NOTE — Therapy (Signed)
 OUTPATIENT PHYSICAL THERAPY NEURO TREATMENT   Patient Name: Javier Graham MRN: [id number] DOB:[birth year], 68 y.o., male Today's Date: 09/20/2023   PCP: Vallarie Gauze, MD REFERRING PROVIDER: Rheba Cedar, PA  END OF SESSION:  PT End of Session - 09/20/23 0846     Visit Number 5    Number of Visits 16    Date for PT Re-Evaluation 10/06/23    Authorization Type [employer] Medicare    PT Start Time 0845    PT Stop Time 0930    PT Time Calculation (min) 45 min    Activity Tolerance Patient tolerated treatment well    Behavior During Therapy [facility] for tasks assessed/performed            Past Medical History:  Diagnosis Date   Diabetes (HCC)    Diplopia    Headache    Past Surgical History:  Procedure Laterality Date   BUNIONECTOMY     Patient Active Problem List   Diagnosis Date Noted   Glioblastoma, IDH-wildtype (HCC) 07/27/2023   Diplopia 12/19/2018   Stenosis of right vertebral artery 12/19/2018   Diabetes (HCC) 06/19/2016   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   High ankle sprain 10/17/2012   Leg pain 10/17/2012   ONSET DATE: 07/05/23 REFERRING DIAG:  Diagnosis  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 Yes; can get HA and low back pain. Also notes an achiness in thighs when beginning activities that is new with current medical issues.  PRECAUTIONS: Fall  WEIGHT BEARING  RESTRICTIONS: No  FALLS: Has patient fallen in last 6 months? Yes. Number of falls 1 one fall while getting out of bed in the middle of the night  LIVING ENVIRONMENT: Lives with: lives with their family Lives in: House/apartment Stairs: Yes: Internal: 12-14 steps to top floor and to basement, able to live on main floor steps; handrail Has following equipment at home: None  PLOF: Independent spends time with grandkids  PATIENT GOALS: I want to be able to go out and hike with grandkids without limitations (10 and 14 years  old)  OBJECTIVE:   TODAY'S TREATMENT: 09/20/23 Activity Comments  NU-step level 4 x 9 min Varied speeds  Static multisensory balance Difficulty with weight shift left  Dynamic balance Tandem walk Retrowalk Sidestep 4-square step Sit-stand w/ alt knee tap              TODAY'S TREATMENT: 09/13/23 Activity Comments  Retrowalk/tandem forward 2x50 ft CGA due to unsteadiness, difficulty with hip extension LLE for retro and difficulty coming to midline with tandem  Sit-stand alternate knee tap 1x10 CGA and cues for sequence  Multisensory balance   Heel-toe rock in standing 2x10 Cues for sequence--decreased left ankle control/ROM               PATIENT EDUCATION: Education details: HEP initiation, bungee leader for walking with spouse Person educated: Patient and Spouse Education method: Explanation, Facilities manager, and Handouts Education comprehension: verbalized understanding, returned demonstration, and needs further education  HOME EXERCISE PROGRAM: Access Code: [email or web address] URL: [email or web address] Date: 08/30/2023 Prepared by: Kelly Nguyen Todorov  Program Notes sit to stand with cross-body alternate knee taps 3x10   Exercises - Seated Reaching to Side and Across Body  - 1 x daily - 7 x weekly - 3 sets - 10 reps __________________________________________________________________________________________________________ Note: Objective measures  were completed at Evaluation unless otherwise noted.  DIAGNOSTIC FINDINGS: reviewed in chart  COGNITION: Overall cognitive status: impaired with memory changes   SENSATION: WFL  COORDINATION: Rapid alternating movements, FTN, and HTS are equal and symmetric bilaterally  MUSCLE TONE: WNLs  POSTURE: No Significant postural limitations  LOWER EXTREMITY ROM:    WFLs per observation t/o evaluation  LOWER EXTREMITY MMT:  WFLs for all activities demonstrating functional squat to the ground and return to stand.  BED MOBILITY:  Findings: independent  TRANSFERS: Sit to stand: Complete Independence  Assistive device utilized: None      STAIRS: Patient requires CGA due to catching his toes on the edge of steps x 4 steps  GAIT: Device: None Assist: Patient needs supervision due to visual deficits with L visual field cut and possible inattention/neglect. Characteristics:  Gait is characterized by slower speeds, mostly due to navigating obstacles in clinic Gait speed 2.44 ft/sec  FUNCTIONAL TESTS:  Berg=50/56 FGA=22/30    PATIENT SURVEYS:  Planned to use patient specific activity score, however, due to memory and awareness did not ask.    __________________________________________________________________________________________________________ GOALS: Goals reviewed with patient? Yes  SHORT TERM GOALS: Target date: 09/21/23  The patient will perform HEP with cues/reminders from spouse. Baseline: initiated at eval Goal status: IN PROGRESS  2.  The patient will maintain single limb standing x 10 seconds R and L sides.  Baseline:  Holds 3-4 seconds on each LE Goal status: IN PROGRESS  LONG TERM GOALS: Target date: 10/06/23  The patient will perform progression of HEP with assist from family as needed. Baseline:  initiated at eval Goal status: INITIAL  2.  The patient will improve FGA to > or equal to 24/30. Baseline: 22/30 *anticipate that visual deficits will lead to  continued need for supervision during some tasks Goal status: INITIAL  3.  The patient will report resolution of thigh discomfort with extended ambulation > 30 minutes. Baseline:  notes fatigue and achiness in bilateral quads Goal status: INITIAL  4.  The patient will demonstrate floor<>stand due to h/o 1 fall to ensure ability to safety stand with UE support through floor. Baseline:  due to h/o fall Goal status: INITIAL  ASSESSMENT:  CLINICAL IMPRESSION: Activities to promote  crossing of midline and left attention with cues in larger amplitude for single limb support.  Continuing to demo apraxia and impaired proprioceptive/kinesthetic awareness requiring tactile cues for guidance. Improved performacne with visual reference points to help sequence.  Continued sessions to progress POC details to improve motor control and safety to reduce risk for falls  OBJECTIVE IMPAIRMENTS: Abnormal gait, decreased balance, decreased safety awareness, impaired vision/preception, and pain.   ACTIVITY LIMITATIONS: locomotion level  PARTICIPATION LIMITATIONS: community activity and yard work  PERSONAL FACTORS: 1-2 comorbidities: diabetes, glioblastoma are also affecting patient's functional outcome.   REHAB POTENTIAL: Fair due to nature of condition  CLINICAL DECISION MAKING: Evolving/moderate complexity  EVALUATION COMPLEXITY: Moderate  PLAN:  PT FREQUENCY: 2x/week  PT DURATION: 8 weeks  PLANNED INTERVENTIONS: 97164- PT Re-evaluation, 97110-Therapeutic exercises, 97530- Therapeutic activity, 97112- Neuromuscular re-education, 97535- Self Care, 30865- Manual therapy, [phone]- Gait training, [phone]- Aquatic Therapy, Patient/Family education, Balance training, Stair training, and Visual/preceptual remediation/compensation  PLAN FOR NEXT SESSION: Add HEP considering single limb stance near support on R side, standing with head motions or controlled 1/2 turns to work on repetition for scanning  environment due to visual deficits, discuss continued outdoor walking with supervision, and trial coordination activities for clearing obstacles and determine if safe for home. Focus on foot clearance near obstacles, scanning environment in therapy with cards/visual cues. Engage spouse with HEP due to potentially limited carryover.   8:47 AM, 09/20/23 M. Kelly Illianna Paschal, PT, DPT Physical Therapist- [place] Office Number: [phone]

## 2023-09-20 NOTE — Therapy (Signed)
 OUTPATIENT OCCUPATIONAL THERAPY NEURO Treatment  Patient Name: Javier Graham MRN: [id number] DOB:04-Jul-1955, 68 y.o., male Today's Date: 09/20/2023  PCP:  Vallarie Gauze, MD  REFERRING PROVIDER: Rachel Budds, PA  END OF SESSION:  OT End of Session - 09/20/23 1027     Visit Number 5    Number of Visits 13    Date for OT Re-Evaluation 10/06/23    Authorization Type [employer] - auth submitted    OT Start Time [phone]    OT Stop Time 1014    OT Time Calculation (min) 40 min    Activity Tolerance Patient tolerated treatment well    Behavior During Therapy [facility] for tasks assessed/performed                 Past Medical History:  Diagnosis Date   Diabetes (HCC)    Diplopia    Headache    Past Surgical History:  Procedure Laterality Date   BUNIONECTOMY     Patient Active Problem List   Diagnosis Date Noted   Glioblastoma, IDH-wildtype (HCC) 07/27/2023   Diplopia 12/19/2018   Stenosis of right vertebral artery 12/19/2018   Diabetes (HCC) 06/19/2016   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 "Good" overall.  From eval: The patient presented to ED due to difficulties with driving navigation and confusion. He was found to have a large R parietal lobe mass that was resected at [facility] on 07/05/23 and pathology is consistent with glioblastoma.   Pt accompanied by: self and significant other(wife-Javier Graham in waiting room)  PERTINENT HISTORY: diabetes, h/o HA and h/o diplopia   PRECAUTIONS: Fall and Other: s/p surgery did have restriction of no  lifting > 15#, L visual field cut  WEIGHT BEARING RESTRICTIONS: No  PAIN:  Are you having pain? No  FALLS: Has patient fallen in last 6 months? Yes. Number of falls 1, fell in February and hit his eye went to ED  LIVING ENVIRONMENT: Lives with: lives with their spouse Lives in: House/apartment Stairs: Yes: Internal: bedroom is on main level, but does have a flight of steps to downstairs "rec room" and second floor which he rarely goes up steps; handrail and External: 1 steps in from garage Has following equipment at home: Walker - 4 wheeled, Grab bars, and built in bench in shower that pt has used s/p surgery  PLOF: Independent and Independent with basic ADLs; retired, spends time with grandkids (10 and 92 yo)  PATIENT GOALS: to be able to drive again; to be able to do things with the grandchildren  OBJECTIVE:  Note: Objective measures were completed at Evaluation unless otherwise noted.  HAND DOMINANCE: Right  ADLs: Overall ADLs: spouse reports pt will frequently put clothing on backwards Transfers/ambulation related to ADLs: Eating: pt will occasionally not see items on L during eating, has broken glasses that were on L side as he did not see them LB Dressing: occasional increased time to problem solve tying shoes Toileting: Mod I Bathing: Mod I Tub Shower transfers: Mod I Equipment: Grab bars and Walk in shower  IADLs: Light housekeeping: he helps make  the bed and dries dishes, takes out the garbage independently Meal Prep: Intermittent supervision as pt grilled steaks, pt is not primary cook.   Community mobility: not cleared to drive  Medication management: did not ask due to time constraints  MOBILITY STATUS: somewhat guarded due to L peripheral vision impairment  POSTURE COMMENTS:  No Significant postural limitations  ACTIVITY TOLERANCE: Activity tolerance: decreased endurance  FUNCTIONAL OUTCOME MEASURES: Physical performance test: not completed this session  due to time constraints and pt with decreased awareness of deficits* 9 hole peg test: 9 Hole Peg test: Right: 46.85 sec; Left: 65.87 sec; pt demonstrating decreased recall of instructions and decreased awareness as picking up multiple pegs on L and requiring cues to remove pegs after placement bilaterally   UPPER EXTREMITY ROM:  WFL bilaterally  UPPER EXTREMITY MMT:   grossly 5/5 bilaterally  COORDINATION: 9 Hole Peg test: Right: 46.85 sec; Left: 65.87 sec - pt demonstrating decreased recall of instructions and decreased awareness as picking up multiple pegs on L and requiring cues to remove pegs after placement bilaterally  Rapid alternating movements and FTN are equal and symmetric bilaterally   SENSATION: WFL on eval; however may need to further assess functionally as pt with initial concerns for L sensory impairment on chart review  COGNITION: Overall cognitive status: impaired, with memory changes  VISION: Subjective report: L neglect   VISION ASSESSMENT: To be further assessed in functional context Tracking/Visual pursuits: Decreased smoothness with horizontal tracking and difficulty to assess due to difficulty with following commands Visual Fields: Left visual field deficits - pt reports seeing movement in L peripheral vision prior to recognition of stimulus at 30*  PERCEPTION: Impaired: Inattention/neglect: does not attend to left visual field  OBSERVATIONS: Pleasant gentleman with supportive wife who is able to provide additional information as pt with decreased awareness of impairments and impact of impairments on safety and independence.                                                                                                                             TREATMENT DATE:   TherAct OT initiated FM coordination HEP (handout provided, see pt instructions) - to improve B UE FM coordination, dexterity, proprioception. Pt returned demonstration of each exercise with  continuous cueing and therapist modeling: Picking up objects and placing in a container with slot Stacking towers of Lego blocks Finger-to-palm then palm-to-finger translation of small items Shuffling cards Turning cards over 1 at a time Hold deck of cards in palm of hand and push off 1 card at a time from the top of the deck using only thumb Tear piece of tissue and rolling into small balls with fingertips Meditation balls - graded down to 1 golf ball and rotating in fingertips. Rolling pen between fingers and thumb, pen "twirl" (rotation), pen "shift" (translation) - pen translation removed d/t pt demo'ing difficulty with movement despite continuous cueing and therapist modeling.  Self-Care Pt c/o back pain  and OT noted pt leaning heavily to R side. OT educated pt on upright seated posture with max v/c and visual cues and therapist modeling. V/c provided intermittently for remainder of session.  During tasks today, pt required continuous v/c today and repeated verbal instructions, often mishearing what OT was saying (e.g. "per chance" instead of "switch hands" 3x). Pt denied dizziness/lightheadedness. Pt reported drinking water today though has not eaten. PT updated OT that pt's vital signs were stable. OT assessed vitals, see vital signs. Vitals within therapeutic parameters. OT updated pt's spouse at end of session regarding increased cueing required for tasks today and recommended to monitor at home. Pt's spouse acknowledged understanding though expressed concerns that pt no longer wants to continue with therapy and does not participate in any tasks at home. Pt's spouse reported "I'll try" to help pt with HEP.   PATIENT EDUCATION: Education details: see today's tx above Person educated: Patient and Spouse Education method: Explanation Education comprehension: needs further education  HOME EXERCISE PROGRAM: 08/30/23 - visual scanning (see pt instructions) 09/20/23 - FM coordination per pt  instructions   GOALS: Goals reviewed with patient? Yes  SHORT TERM GOALS: Target date: 09/15/23  Pt will be independent in coordination HEP with use of visual handouts. Baseline: decreased coordination bilaterally on 9 hole peg test Goal status: in progress  2.  Pt will verbalize understanding with safety considerations and AE needs to ensure safety d/t decreased sensation.  Baseline: diminished sensation per chart review Goal status: in progress  3.  Pt will demonstrate understanding of memory compensations and ways to keep thinking skills sharp. Baseline: decreased memory for use of visual compensation Goal status: in progress - initiated during 09/13/23 session  4.  Pt will verbalize understanding of energy conservation strategies to increase safety with ADLs and IADLs and report 2 in use to increase endurance as needed to complete leisure tasks (to include playing and hiking with grandchildren). Baseline: decreased endurance Goal status: in progress  LONG TERM GOALS: Target date: 10/06/23  Pt will verbalize understanding of task modifications and/or potential DME/AE needs to increase ease, safety, and independence w/ ADLs.  Baseline: decreased endurance, donning clothing backwards with decreased awareness Goal status: in progress  2.  Pt will complete table top scanning activity with Supervision. Baseline: L peripheral impairment Goal status: in progress  3.  Pt will navigate a moderately busy environment, following multi-step commands with 90% accuracy Baseline: difficulty with dual tasking, recall of instructions/compensatory strategies Goal status: in progress  4.  Pt will demonstrate ability to sequence simple functional task/IADL (simple snack prep, laundry task, etc) at Mod I level with good safety awareness Baseline: decreased sequence and recall of instructions/compensatory strategies Goal status: in progress   ASSESSMENT:  CLINICAL IMPRESSION: Pt required  continuous v/c, tactile cues, visual cues, and therapist modeling to participate in tasks today. Pt appeared to initiate correct movement but then required cueing to recall purpose/goal of task.  OT often provided pt with verbal reassurance during tasks. However, pt frequently asked why OT said "good job" after pt correctly completed task. Following OT explanation, pt acknowledged understanding though would repeat the comment during another task. Pt's spouse also verbalizing that pt with decreased interest and desire to continue with therapy services and decreased engagement with HEP and other activities in home environment. OT updated pt's spouse at end of session regarding pt's increased cueing required today and to monitor at home. Pt's spouse acknowledged understanding.   Pt would continue to benefit  from review of visual scanning strategies during functional tasks to improve safety and attention to affected side. Pt demonstrating L inattention during mobility and table top tasks.  Pt will continue to benefit from skilled occupational therapy services to address coordination, pain management, altered sensation, balance, GM/FM control, cognition, safety awareness, introduction of compensatory strategies/AE prn, visual-perception, and implementation of an HEP to improve participation and safety during ADLs and IADLs.    PERFORMANCE DEFICITS: in functional skills including ADLs, IADLs, coordination, proprioception, sensation, pain, Fine motor control, Gross motor control, body mechanics, endurance, decreased knowledge of precautions, decreased knowledge of use of DME, vision, and UE functional use, cognitive skills including attention, memory, problem solving, safety awareness, and sequencing, and psychosocial skills including coping strategies, environmental adaptation, interpersonal interactions, and routines and behaviors.    PLAN:  OT FREQUENCY: 1-2x/week  OT DURATION: 6 weeks  PLANNED  INTERVENTIONS: 97168 OT Re-evaluation, 97535 self care/ADL training, 91478 therapeutic exercise, 97530 therapeutic activity, 97112 neuromuscular re-education, functional mobility training, visual/perceptual remediation/compensation, psychosocial skills training, energy conservation, coping strategies training, patient/family education, and DME and/or AE instructions  RECOMMENDED OTHER SERVICES: may benefit from speech therapy  CONSULTED AND AGREED WITH PLAN OF CARE: Patient and family member/caregiver  PLAN FOR NEXT SESSION:   FM activities (review HEP PRN if appropriate) Handouts: Memory compensation strategies, keep thinking skills sharp - review Handout: energy conservation Complete table top vision task - avoid card games at upcoming sessions per pt request, instead try e.g. puzzles or word cancellation activity Continue to educate on visual compensation during functional tasks   Oakley Bellman, OTR/L 09/20/2023, 10:31 AM   [facility] [address] [place], [place] [address] Phone # [phone] Fax # [phone]

## 2023-09-26 ENCOUNTER — Other Ambulatory Visit: Payer: Self-pay | Admitting: *Deleted

## 2023-09-26 ENCOUNTER — Inpatient Hospital Stay
Admission: RE | Admit: 2023-09-26 | Discharge: 2023-09-26 | Disposition: A | Discharge status: Home / Self Care | Payer: Self-pay | Source: Ambulatory Visit | Attending: Internal Medicine | Admitting: Internal Medicine

## 2023-09-26 DIAGNOSIS — C719 Malignant neoplasm of brain, unspecified: Secondary | ICD-10-CM

## 2023-09-26 NOTE — Progress Notes (Signed)
 Requested Scan from [facility]

## 2023-09-27 ENCOUNTER — Ambulatory Visit

## 2023-09-27 ENCOUNTER — Ambulatory Visit: Admitting: Occupational Therapy

## 2023-09-27 DIAGNOSIS — R2681 Unsteadiness on feet: Secondary | ICD-10-CM

## 2023-09-27 DIAGNOSIS — R29818 Other symptoms and signs involving the nervous system: Secondary | ICD-10-CM

## 2023-09-27 DIAGNOSIS — M6281 Muscle weakness (generalized): Secondary | ICD-10-CM

## 2023-09-27 DIAGNOSIS — R278 Other lack of coordination: Secondary | ICD-10-CM | POA: Diagnosis not present

## 2023-09-27 DIAGNOSIS — R41840 Attention and concentration deficit: Secondary | ICD-10-CM

## 2023-09-27 DIAGNOSIS — R2689 Other abnormalities of gait and mobility: Secondary | ICD-10-CM

## 2023-09-27 NOTE — Therapy (Signed)
 OUTPATIENT OCCUPATIONAL THERAPY NEURO Treatment  Patient Name: Javier Graham MRN: [id number] DOB:06/02/1955, 68 y.o., male Today's Date: 09/27/2023  PCP:  Javier Gauze, MD  REFERRING PROVIDER: Rachel Budds, PA  END OF SESSION:  OT End of Session - 09/27/23 1017     Visit Number 6    Number of Visits 13    Date for OT Re-Evaluation 10/06/23    Authorization Type [employer] Medicare - auth submitted    OT Start Time 0935    OT Stop Time 1014    OT Time Calculation (min) 39 min    Activity Tolerance Patient tolerated treatment well    Behavior During Therapy WFL for tasks assessed/performed                  Past Medical History:  Diagnosis Date   Diabetes (HCC)    Diplopia    Headache    Past Surgical History:  Procedure Laterality Date   BUNIONECTOMY     Patient Active Problem List   Diagnosis Date Noted   Glioblastoma, IDH-wildtype (HCC) 07/27/2023   Diplopia 12/19/2018   Stenosis of right vertebral artery 12/19/2018   Diabetes (HCC) 06/19/2016   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   Pt reported "good, nothing unusual from the pain side of things." Pt reported "everything seems fine."  From eval: The patient presented to ED due to difficulties with driving navigation and confusion. He was found to have a large R parietal lobe mass that was resected at [facility] on 07/05/23 and pathology is  consistent with glioblastoma.   Pt accompanied by: self and significant other(wife-Javier Graham in waiting room)  PERTINENT HISTORY: diabetes, h/o HA and h/o diplopia   PRECAUTIONS: Fall and Other: s/p surgery did have restriction of no lifting > 15#, L visual field cut  WEIGHT BEARING RESTRICTIONS: No  PAIN:  Are you having pain? No  FALLS: Has patient fallen in last 6 months? Yes. Number of falls 1, fell in February and hit his eye went to ED  LIVING ENVIRONMENT: Lives with: lives with their spouse Lives in: House/apartment Stairs: Yes: Internal: bedroom is on main level, but does have a flight of steps to downstairs "rec room" and second floor which he rarely goes up steps; handrail and External: 1 steps in from garage Has following equipment at home: Walker - 4 wheeled, Grab bars, and built in bench in shower that pt has used s/p surgery  PLOF: Independent and Independent with basic ADLs; retired, spends time with grandkids (10 and 29 yo)  PATIENT GOALS: to be able to drive again; to be able to do things with the grandchildren  OBJECTIVE:  Note: Objective measures were completed at Evaluation unless otherwise noted.  HAND DOMINANCE: Right  ADLs: Overall ADLs: spouse reports pt will frequently put clothing on backwards Transfers/ambulation related to ADLs: Eating: pt will occasionally not see items on L during eating, has broken glasses that were on L side as  he did not see them LB Dressing: occasional increased time to problem solve tying shoes Toileting: Mod I Bathing: Mod I Tub Shower transfers: Mod I Equipment: Grab bars and Walk in shower  IADLs: Light housekeeping: he helps make the bed and dries dishes, takes out the garbage independently Meal Prep: Intermittent supervision as pt grilled steaks, pt is not primary cook.   Community mobility: not cleared to drive  Medication management: did not ask due to time constraints  MOBILITY STATUS: somewhat guarded due to L  peripheral vision impairment  POSTURE COMMENTS:  No Significant postural limitations  ACTIVITY TOLERANCE: Activity tolerance: decreased endurance  FUNCTIONAL OUTCOME MEASURES: Physical performance test: not completed this session due to time constraints and pt with decreased awareness of deficits* 9 hole peg test: 9 Hole Peg test: Right: 46.85 sec; Left: 65.87 sec; pt demonstrating decreased recall of instructions and decreased awareness as picking up multiple pegs on L and requiring cues to remove pegs after placement bilaterally   UPPER EXTREMITY ROM:  WFL bilaterally  UPPER EXTREMITY MMT:   grossly 5/5 bilaterally  COORDINATION: 9 Hole Peg test: Right: 46.85 sec; Left: 65.87 sec - pt demonstrating decreased recall of instructions and decreased awareness as picking up multiple pegs on L and requiring cues to remove pegs after placement bilaterally  Rapid alternating movements and FTN are equal and symmetric bilaterally   SENSATION: WFL on eval; however may need to further assess functionally as pt with initial concerns for L sensory impairment on chart review  COGNITION: Overall cognitive status: impaired, with memory changes  VISION: Subjective report: L neglect   VISION ASSESSMENT: To be further assessed in functional context Tracking/Visual pursuits: Decreased smoothness with horizontal tracking and difficulty to assess due to difficulty with following commands Visual Fields: Left visual field deficits - pt reports seeing movement in L peripheral vision prior to recognition of stimulus at 30*  PERCEPTION: Impaired: Inattention/neglect: does not attend to left visual field  OBSERVATIONS: Pleasant gentleman with supportive wife who is able to provide additional information as pt with decreased awareness of impairments and impact of impairments on safety and independence.                                                                                                                              TREATMENT DATE:   Self-Care OT educated pt on energy conservation strategies, stress management strategies, task modification, and deep breathing. Handout provided, see pt instructions. Pt verbalized understanding though recommended to review for improved carryover d/t ongoing cognitive deficits. OT updated pt's spouse regarding education at end of session.  TherAct Pt demo'd stiff gait today, requiring SBA with gait belt with increased cueing for sit/stand transitions to reduce fall risk and improve safety.  Seated, Placing/removing resistive clips (yellow, red, green, blue, black) from upright and vertical surfaces using RUE - 2-step directions provided, pt demo'd difficulty with 3-step direction - to improve sequencing, problem-solving, BUE strengthening and coordination,  for visual scanning.  As task progressed, pt demo'd increased number of errors and benefited from increased cueing to recall goal of task and for problem-solving. Max cueing for error correction x4 at end of task.  Seated, stringing beads - graded down from shoestring to pipe cleaner d/t difficulty with sequencing - to improve BUE FM coordination, B integration, sequencing, and problem-solving. Pt required max cueing for task and primarily used RUE instead of B integration.   PATIENT EDUCATION: Education details: see today's tx above Person educated: Patient and Spouse Education method: Explanation Education comprehension: needs further education  HOME EXERCISE PROGRAM: 08/30/23 - visual scanning (see pt instructions) 09/20/23 - FM coordination per pt instructions 09/27/23 - energy conservation per pt instructions   GOALS: Goals reviewed with patient? Yes  SHORT TERM GOALS: Target date: 09/15/23  Pt will be independent in coordination HEP with use of visual handouts. Baseline: decreased coordination bilaterally on 9 hole peg test Goal status: in progress  2.  Pt will verbalize understanding with  safety considerations and AE needs to ensure safety d/t decreased sensation.  Baseline: diminished sensation per chart review Goal status: in progress  3.  Pt will demonstrate understanding of memory compensations and ways to keep thinking skills sharp. Baseline: decreased memory for use of visual compensation Goal status: in progress - initiated during 09/13/23 session  4.  Pt will verbalize understanding of energy conservation strategies to increase safety with ADLs and IADLs and report 2 in use to increase endurance as needed to complete leisure tasks (to include playing and hiking with grandchildren). Baseline: decreased endurance Goal status: in progress  LONG TERM GOALS: Target date: 10/06/23  Pt will verbalize understanding of task modifications and/or potential DME/AE needs to increase ease, safety, and independence w/ ADLs.  Baseline: decreased endurance, donning clothing backwards with decreased awareness Goal status: in progress  2.  Pt will complete table top scanning activity with Supervision. Baseline: L peripheral impairment Goal status: in progress  3.  Pt will navigate a moderately busy environment, following multi-step commands with 90% accuracy Baseline: difficulty with dual tasking, recall of instructions/compensatory strategies Goal status: in progress  4.  Pt will demonstrate ability to sequence simple functional task/IADL (simple snack prep, laundry task, etc) at Mod I level with good safety awareness Baseline: decreased sequence and recall of instructions/compensatory strategies Goal status: in progress   ASSESSMENT:  CLINICAL IMPRESSION: Pt required max cueing for tasks today, demo'ing increased difficulty with recall of goal of task as tasks progressed. Pt appeared to initiate correct movement but then required cueing to recall purpose/goal of task.  Pt would continue to benefit from review of visual scanning strategies during functional tasks to improve  safety and attention to affected side. Pt demonstrating L inattention during mobility and table top tasks.  Pt will continue to benefit from skilled occupational therapy services to address coordination, pain management, altered sensation, balance, GM/FM control, cognition, safety awareness, introduction of compensatory strategies/AE prn, visual-perception, and implementation of an HEP to improve participation and safety during ADLs and IADLs.    PERFORMANCE DEFICITS: in functional skills including ADLs, IADLs, coordination, proprioception, sensation, pain, Fine motor control, Gross motor control, body mechanics, endurance, decreased knowledge of precautions, decreased knowledge of use of DME, vision, and UE functional use, cognitive skills including attention, memory, problem solving, safety awareness, and sequencing, and psychosocial skills including coping strategies, environmental adaptation, interpersonal interactions, and routines and behaviors.    PLAN:  OT FREQUENCY: 1-2x/week  OT DURATION: 6 weeks  PLANNED INTERVENTIONS: 97168 OT Re-evaluation, 97535 self care/ADL training, 16109 therapeutic exercise, 97530 therapeutic activity, 97112 neuromuscular re-education, functional mobility training, visual/perceptual remediation/compensation, psychosocial skills training, energy conservation, coping strategies training, patient/family education, and DME and/or AE instructions  RECOMMENDED OTHER SERVICES: may benefit from speech therapy  CONSULTED AND AGREED WITH PLAN OF CARE: Patient and family member/caregiver  PLAN FOR NEXT SESSION:   FM activities (review HEP PRN if appropriate) Handouts: Memory compensation strategies, keep thinking skills sharp - review Complete table top vision task - avoid card games at upcoming sessions per pt request, instead try e.g. puzzles or word cancellation activity Continue to educate on visual compensation during functional tasks Safety with sit-to-stands,  ambulation   Oakley Bellman, OTR/L 09/27/2023, 10:21 AM   [facility] [address], [address] [place], [place] [address] Phone # [phone] Fax # [phone]

## 2023-09-27 NOTE — Therapy (Signed)
 OUTPATIENT PHYSICAL THERAPY NEURO TREATMENT   Patient Name: Javier Graham MRN: [id number] DOB:04-16-1956, 68 y.o., male Today's Date: 09/27/2023   PCP: Vallarie Gauze, MD REFERRING PROVIDER: Rheba Cedar, PA  END OF SESSION:  PT End of Session - 09/27/23 0855     Visit Number 6    Number of Visits 16    Date for PT Re-Evaluation 10/06/23    Authorization Type [employer] Medicare    PT Start Time 0845    PT Stop Time 0930    PT Time Calculation (min) 45 min    Activity Tolerance Patient tolerated treatment well    Behavior During Therapy [facility] for tasks assessed/performed            Past Medical History:  Diagnosis Date   Diabetes (HCC)    Diplopia    Headache    Past Surgical History:  Procedure Laterality Date   BUNIONECTOMY     Patient Active Problem List   Diagnosis Date Noted   Glioblastoma, IDH-wildtype (HCC) 07/27/2023   Diplopia 12/19/2018   Stenosis of right vertebral artery 12/19/2018   Diabetes (HCC) 06/19/2016   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   High ankle sprain 10/17/2012   Leg pain 10/17/2012   ONSET DATE: 07/05/23 REFERRING DIAG:  Diagnosis  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 Yes; can get HA and low back pain. Also notes an achiness in thighs when beginning activities that is new with current medical issues.  PRECAUTIONS: Fall  WEIGHT BEARING  RESTRICTIONS: No  FALLS: Has patient fallen in last 6 months? Yes. Number of falls 1 one fall while getting out of bed in the middle of the night  LIVING ENVIRONMENT: Lives with: lives with their family Lives in: House/apartment Stairs: Yes: Internal: 12-14 steps to top floor and to basement, able to live on main floor steps; handrail Has following equipment at home: None  PLOF: Independent spends time with grandkids  PATIENT GOALS: I want to be able to go out and hike with grandkids without limitations (10 and 67 years  old)  OBJECTIVE:    TODAY'S TREATMENT: 09/27/23 Activity Comments  NU-step LE only level 5 x 8 min For rapid alternating movement w/ multtasking x 2 min  Multi-step scanning and motor task -attempted trials of 3 step direction (catch color bean bag, step on corresponding target, deposit in basket)--unable to scan and sequence requiring 2-step activity  Tandem walk Use of visual contrast on floor to sequence  Retro-walk/fast forward 3x45 ft Use of visual anchor point                   PATIENT EDUCATION: Education details: HEP initiation, bungee leader for walking with spouse Person educated: Patient and Spouse Education method: Explanation, Facilities manager, and Handouts Education comprehension: verbalized understanding, returned demonstration, and needs further education  HOME EXERCISE PROGRAM: Access Code: [email or web address] URL: [email or web address] Date: 08/30/2023 Prepared by: Kelly Ivett Luebbe  Program Notes sit to stand with cross-body alternate knee taps 3x10   Exercises - Seated Reaching to Side and Across Body  - 1 x daily - 7 x weekly - 3 sets - 10 reps __________________________________________________________________________________________________________ Note: Objective measures were completed at Evaluation unless otherwise noted.  DIAGNOSTIC FINDINGS: reviewed in chart  COGNITION: Overall cognitive status: impaired with memory  changes   SENSATION: WFL  COORDINATION: Rapid alternating movements, FTN, and HTS are equal and symmetric bilaterally  MUSCLE TONE: WNLs  POSTURE: No Significant postural limitations  LOWER EXTREMITY ROM:    WFLs per observation t/o evaluation  LOWER EXTREMITY MMT:  WFLs for all activities demonstrating functional squat to the ground and return to stand.  BED MOBILITY:  Findings: independent  TRANSFERS: Sit to stand: Complete Independence  Assistive device utilized: None      STAIRS: Patient requires CGA due to catching his toes on the edge of steps x 4 steps  GAIT: Device: None Assist: Patient needs supervision due to visual deficits with L visual field cut and possible inattention/neglect. Characteristics:  Gait is characterized by slower speeds, mostly due to navigating obstacles in clinic Gait speed 2.44 ft/sec  FUNCTIONAL TESTS:  Berg=50/56 FGA=22/30    PATIENT SURVEYS:  Planned to use patient specific activity score, however, due to memory and awareness did not ask.    __________________________________________________________________________________________________________ GOALS: Goals reviewed with patient? Yes  SHORT TERM GOALS: Target date: 09/21/23  The patient will perform HEP with cues/reminders from spouse. Baseline: initiated at eval Goal status: IN PROGRESS  2.  The patient will maintain single limb standing x 10 seconds R and L sides.  Baseline:  Holds 3-4 seconds on each LE Goal status: IN PROGRESS  LONG TERM GOALS: Target date: 10/06/23  The patient will perform progression of HEP with assist from family as needed. Baseline:  initiated at eval Goal status: INITIAL  2.  The patient will improve FGA to > or equal to 24/30. Baseline: 22/30 *anticipate that visual deficits will lead to continued need for supervision during some tasks Goal status: INITIAL  3.  The patient will report resolution of thigh discomfort with extended ambulation  > 30 minutes. Baseline:  notes fatigue and achiness in bilateral quads Goal status: INITIAL  4.  The patient will demonstrate floor<>stand due to h/o 1 fall to ensure ability to safety stand with UE support through floor. Baseline:  due to h/o fall Goal status: INITIAL  ASSESSMENT:  CLINICAL IMPRESSION: Activities to promote rapid alternating movement and combining multitask with this but difficulty sustaining foot movement with upper body activities. Activities requiring 2-3 step direction and visual scanning to  improve awareness but difficulty following 3 step ultimately required 1-2 step.  Motor control/planning activity requiring heavy verbal cues to sustain.  Noticing increased gait deviations affecting LLE w/ decreased fluidity/coordination.   OBJECTIVE IMPAIRMENTS: Abnormal gait, decreased balance, decreased safety awareness, impaired vision/preception, and pain.   ACTIVITY LIMITATIONS: locomotion level  PARTICIPATION LIMITATIONS: community activity and yard work  PERSONAL FACTORS: 1-2 comorbidities: diabetes, glioblastoma are also affecting patient's functional outcome.   REHAB POTENTIAL: Fair due to nature of condition  CLINICAL DECISION MAKING: Evolving/moderate complexity  EVALUATION COMPLEXITY: Moderate  PLAN:  PT FREQUENCY: 2x/week  PT DURATION: 8 weeks  PLANNED INTERVENTIONS: 97164- PT Re-evaluation, 97110-Therapeutic exercises, 97530- Therapeutic activity, 97112- Neuromuscular re-education, 97535- Self Care, 16109- Manual therapy, [phone]- Gait training, [phone]- Aquatic Therapy, Patient/Family education, Balance training, Stair training, and Visual/preceptual remediation/compensation  PLAN FOR NEXT SESSION: Add HEP considering single limb stance near support on R side, standing with head motions or controlled 1/2 turns to work on repetition for scanning environment due to visual deficits, discuss continued outdoor walking with supervision, and trial coordination  activities for clearing obstacles and determine if safe for home. Focus on foot clearance near obstacles, scanning environment in therapy with cards/visual cues. Engage spouse with HEP due to potentially limited carryover.   8:56 AM, 09/27/23 M. Kelly Sireen Halk, PT, DPT Physical Therapist- [place] Office Number: [phone]

## 2023-10-04 ENCOUNTER — Ambulatory Visit

## 2023-10-04 ENCOUNTER — Ambulatory Visit: Admitting: Occupational Therapy

## 2023-10-04 DIAGNOSIS — R29818 Other symptoms and signs involving the nervous system: Secondary | ICD-10-CM

## 2023-10-04 DIAGNOSIS — R278 Other lack of coordination: Secondary | ICD-10-CM

## 2023-10-04 DIAGNOSIS — M6281 Muscle weakness (generalized): Secondary | ICD-10-CM | POA: Diagnosis not present

## 2023-10-04 DIAGNOSIS — R41840 Attention and concentration deficit: Secondary | ICD-10-CM

## 2023-10-04 DIAGNOSIS — R2681 Unsteadiness on feet: Secondary | ICD-10-CM

## 2023-10-04 DIAGNOSIS — R2689 Other abnormalities of gait and mobility: Secondary | ICD-10-CM | POA: Diagnosis not present

## 2023-10-04 NOTE — Therapy (Signed)
 OUTPATIENT OCCUPATIONAL THERAPY NEURO Treatment  Patient Name: Javier Graham MRN: [id number] DOB:1955/05/14, 68 y.o., male Today's Date: 10/04/2023  PCP:  Vallarie Gauze, MD  REFERRING PROVIDER: Rachel Budds, PA  END OF SESSION:  OT End of Session - 10/04/23 1000     Visit Number 7    Number of Visits 13    Date for OT Re-Evaluation 10/06/23    Authorization Type [employer] Medicare - auth submitted    OT Start Time [phone]    OT Stop Time 1013    OT Time Calculation (min) 40 min    Activity Tolerance Patient tolerated treatment well    Behavior During Therapy [facility] for tasks assessed/performed                   Past Medical History:  Diagnosis Date   Diabetes (HCC)    Diplopia    Headache    Past Surgical History:  Procedure Laterality Date   BUNIONECTOMY     Patient Active Problem List   Diagnosis Date Noted   Glioblastoma, IDH-wildtype (HCC) 07/27/2023   Diplopia 12/19/2018   Stenosis of right vertebral artery 12/19/2018   Diabetes (HCC) 06/19/2016   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    Pt accompanied by: self and significant other(wife-[name] in waiting room)  PERTINENT HISTORY: diabetes, h/o HA and h/o diplopia   PRECAUTIONS: Fall and Other: s/p surgery did have restriction of no lifting > 15#, L visual field cut  WEIGHT BEARING RESTRICTIONS: No  PAIN:  Are you having pain? No  FALLS: Has patient fallen in last 6 months? Yes. Number of falls 1, fell in  February and hit his eye went to ED  LIVING ENVIRONMENT: Lives with: lives with their spouse Lives in: House/apartment Stairs: Yes: Internal: bedroom is on main level, but does have a flight of steps to downstairs "rec room" and second floor which he rarely goes up steps; handrail and External: 1 steps in from garage Has following equipment at home: Walker - 4 wheeled, Grab bars, and built in bench in shower that pt has used s/p surgery  PLOF: Independent and Independent with basic ADLs; retired, spends time with grandkids (10 and 10 yo)  PATIENT GOALS: to be able to drive again; to be able to do things with the grandchildren  OBJECTIVE:  Note: Objective measures were completed at Evaluation unless otherwise noted.  HAND DOMINANCE: Right  ADLs: Overall ADLs: spouse reports pt will frequently put clothing on backwards Transfers/ambulation related to ADLs: Eating: pt will occasionally not see items on L during eating, has broken glasses that were on L side as he did not see them LB Dressing: occasional increased time to problem solve tying shoes Toileting: Mod I Bathing: Mod I Tub Shower transfers: Mod I Equipment: Grab bars and Walk in shower  IADLs: Light housekeeping: he helps make the bed and dries dishes, takes out the garbage independently Meal Prep: Intermittent supervision as pt grilled steaks, pt is not primary cook.   Community mobility:  not cleared to drive  Medication management: did not ask due to time constraints  MOBILITY STATUS: somewhat guarded due to L peripheral vision impairment  POSTURE COMMENTS:  No Significant postural limitations  ACTIVITY TOLERANCE: Activity tolerance: decreased endurance  FUNCTIONAL OUTCOME MEASURES: Physical performance test: not completed this session due to time constraints and pt with decreased awareness of deficits* 9 hole peg test: 9 Hole Peg test: Right: 46.85 sec; Left: 65.87 sec; pt demonstrating decreased recall of  instructions and decreased awareness as picking up multiple pegs on L and requiring cues to remove pegs after placement bilaterally   UPPER EXTREMITY ROM:  WFL bilaterally  UPPER EXTREMITY MMT:   grossly 5/5 bilaterally  COORDINATION: 9 Hole Peg test: Right: 46.85 sec; Left: 65.87 sec - pt demonstrating decreased recall of instructions and decreased awareness as picking up multiple pegs on L and requiring cues to remove pegs after placement bilaterally  Rapid alternating movements and FTN are equal and symmetric bilaterally   SENSATION: WFL on eval; however may need to further assess functionally as pt with initial concerns for L sensory impairment on chart review  COGNITION: Overall cognitive status: impaired, with memory changes  VISION: Subjective report: L neglect   VISION ASSESSMENT: To be further assessed in functional context Tracking/Visual pursuits: Decreased smoothness with horizontal tracking and difficulty to assess due to difficulty with following commands Visual Fields: Left visual field deficits - pt reports seeing movement in L peripheral vision prior to recognition of stimulus at 30*  PERCEPTION: Impaired: Inattention/neglect: does not attend to left visual field  OBSERVATIONS: Pleasant gentleman with supportive wife who is able to provide additional information as pt with decreased awareness of impairments and impact of impairments on safety and independence.                                                                                                                             TREATMENT DATE:  10/04/23 Visual scanning/attention: engaged in visual scanning with letter cancellation.  Pt demonstrating good attention initially during first line, OT providing line coverage to only focus on one line at a time.  Pt correctly crossing out 5 of 8 letter "a" and then crossing out 2 of 6 letter "b" on second row.  Pt omitting letters on L as well as R, locating 2 in middle  of the line.  Pt unable to initiate crossing out letter "c" on third line, despite cues and demonstration. Visual scanning/copying pattern: Utilized Connect 4 to copy A-B pattern.  OT placing alternating colors into Connect 4 grid and providing pt with correct number to replicate pattern.  Pt then placing all remaining pieces onto far R columns to fill up but omitting five columns to Left.  OT providing max cues to draw attention to L side of grid with additional row completed by therapist.  Pt still with decreased attention to L side of grid, even to placing onto incorrect column due to decreased  attention to L. Visual scanning: educated pt on turning head to L and R to scan environment during mobility as well as during table top task to increase attention to stimulus.  Provided with handout of lighthouse image and education for scanning. Energy conservation: reiterated education on 4P's of energy conservation (planning, prioritizing, pacing, and positioning) and providing cues and examples for each to incorporate task modification and  9 hole peg test: Right: 1:24, Left: 2:42 to place (decreased sequencing to remove.  Requiring max cues and therapist hand over R arm to facilitate use of LUE, however pt unable to recall instructions despite cues to remove pegs with L hand.     PATIENT EDUCATION: Education details: see today's tx above Person educated: Patient and Spouse Education method: Explanation Education comprehension: needs further education  HOME EXERCISE PROGRAM: 08/30/23 - visual scanning (see pt instructions) 09/20/23 - FM coordination per pt instructions 09/27/23 - energy conservation per pt instructions   GOALS: Goals reviewed with patient? Yes  SHORT TERM GOALS: Target date: 09/15/23  Pt will be independent in coordination HEP with use of visual handouts. Baseline: decreased coordination bilaterally on 9 hole peg test Goal status: in progress  2.  Pt will verbalize  understanding with safety considerations and AE needs to ensure safety d/t decreased sensation.  Baseline: diminished sensation per chart review Goal status: in progress  3.  Pt will demonstrate understanding of memory compensations and ways to keep thinking skills sharp. Baseline: decreased memory for use of visual compensation Goal status: in progress - initiated during 09/13/23 session  4.  Pt will verbalize understanding of energy conservation strategies to increase safety with ADLs and IADLs and report 2 in use to increase endurance as needed to complete leisure tasks (to include playing and hiking with grandchildren). Baseline: decreased endurance Goal status: in progress  LONG TERM GOALS: Target date: 10/06/23  Pt will verbalize understanding of task modifications and/or potential DME/AE needs to increase ease, safety, and independence w/ ADLs.  Baseline: decreased endurance, donning clothing backwards with decreased awareness Goal status: in progress  2.  Pt will complete table top scanning activity with Supervision. Baseline: L peripheral impairment Goal status: in progress  3.  Pt will navigate a moderately busy environment, following multi-step commands with 90% accuracy Baseline: difficulty with dual tasking, recall of instructions/compensatory strategies Goal status: in progress  4.  Pt will demonstrate ability to sequence simple functional task/IADL (simple snack prep, laundry task, etc) at Mod I level with good safety awareness Baseline: decreased sequence and recall of instructions/compensatory strategies Goal status: in progress   ASSESSMENT:  CLINICAL IMPRESSION: Pt continues to require increased time and max cues for participation in tasks.  Pt demonstrating difficulty recalling purpose of task and sequencing of task.  Pt also continues to demonstrate decreased visual attention to L with functional mobility and table top tasks.  PT spoke with pt's wife who reports  upcoming MRI and f/u with Dr Mark Sil regarding imaging and next steps, therefore plan to hold on therapy at this time. Pt's spouse to call if return services desired.  PERFORMANCE DEFICITS: in functional skills including ADLs, IADLs, coordination, proprioception, sensation, pain, Fine motor control, Gross motor control, body mechanics, endurance, decreased knowledge of precautions, decreased knowledge of use of DME, vision, and UE functional use, cognitive skills including attention, memory, problem solving, safety awareness, and sequencing, and psychosocial skills including coping strategies, environmental adaptation, interpersonal interactions, and routines and behaviors.    PLAN:  OT FREQUENCY: 1-2x/week  OT DURATION: 6 weeks  PLANNED INTERVENTIONS: 97168 OT Re-evaluation, 97535 self care/ADL training, 57846 therapeutic exercise, 97530 therapeutic activity, 97112 neuromuscular re-education, functional mobility training, visual/perceptual remediation/compensation, psychosocial skills training, energy conservation, coping strategies training, patient/family education, and DME and/or AE instructions  RECOMMENDED OTHER SERVICES: may benefit from speech therapy  CONSULTED AND AGREED WITH PLAN OF CARE: Patient and family member/caregiver  PLAN FOR NEXT SESSION:   FM activities (review HEP PRN if appropriate) Handouts: Memory compensation strategies, keep thinking skills sharp - review Complete table top vision task - avoid card games at upcoming sessions per pt request, instead try e.g. puzzles or word cancellation activity Continue to educate on visual compensation during functional tasks Safety with sit-to-stands, ambulation   Kennya Schwenn, OTR/L 10/04/2023, 10:01 AM   [facility] at [facility] [address], [address] [place], [place] [address] Phone # [phone] Fax # [phone]

## 2023-10-04 NOTE — Therapy (Signed)
 OUTPATIENT PHYSICAL THERAPY NEURO TREATMENT and Recertification   Patient Name: Javier Graham MRN: [id number] DOB:05-Feb-1956, 68 y.o., male Today's Date: 10/04/2023   PCP: Vallarie Gauze, MD REFERRING PROVIDER: Rheba Cedar, PA  END OF SESSION:  PT End of Session - 10/04/23 0852     Visit Number 7    Number of Visits 16    Date for PT Re-Evaluation 11/15/23    Authorization Type [employer] Medicare    PT Start Time 0845    PT Stop Time 0930    PT Time Calculation (min) 45 min    Activity Tolerance Patient tolerated treatment well    Behavior During Therapy [facility] for tasks assessed/performed            Past Medical History:  Diagnosis Date   Diabetes (HCC)    Diplopia    Headache    Past Surgical History:  Procedure Laterality Date   BUNIONECTOMY     Patient Active Problem List   Diagnosis Date Noted   Glioblastoma, IDH-wildtype (HCC) 07/27/2023   Diplopia 12/19/2018   Stenosis of right vertebral artery 12/19/2018   Diabetes (HCC) 06/19/2016   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   High ankle sprain 10/17/2012   Leg pain 10/17/2012   ONSET DATE: 07/05/23 REFERRING DIAG:  Diagnosis  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 Yes; can get HA and low back pain. Also notes an achiness in thighs when beginning activities that is new with current medical issues.  PRECAUTIONS:  Fall  WEIGHT BEARING RESTRICTIONS: No  FALLS: Has patient fallen in last 6 months? Yes. Number of falls 1 one fall while getting out of bed in the middle of the night  LIVING ENVIRONMENT: Lives with: lives with their family Lives in: House/apartment Stairs: Yes: Internal: 12-14 steps to top floor and to basement, able to live on main floor steps; handrail Has following equipment at home: None  PLOF: Independent spends time with grandkids  PATIENT GOALS: I want to be able to go out and hike with grandkids without limitations (10 and  52 years old)  OBJECTIVE:   TODAY'S TREATMENT: 10/04/23 Activity Comments  NU-step x 6.5 min X 4 min LE only. X 2.5 min w/ multi-tasking  Left attention/reaction tasks Throwing scarves over left shoulder to react/catch  Tandem forward/retrowalk 3x25 ft--cues for sequence, use of visual contrast to facilitate tandem-crossing midline--unable  Sit to stand 5x5 Cues for initiation/sequence  Standing on foam -EO/EC x 30 sec -visual scanning activity -mild perturbations w/ retro-LOB   Single leg stance unable      TODAY'S TREATMENT: 09/27/23 Activity Comments  NU-step LE only level 5 x 8 min For rapid alternating movement w/ multtasking x 2 min  Multi-step scanning and motor task -attempted trials of 3 step direction (catch color bean bag, step on corresponding target, deposit in basket)--unable to scan and sequence requiring 2-step activity  Tandem walk Use of visual contrast on floor to sequence  Retro-walk/fast forward 3x45 ft Use of visual anchor point                   PATIENT EDUCATION: Education details: HEP initiation, bungee leader for walking with spouse Person educated: Patient and Spouse Education method: Explanation, Facilities manager, and Handouts Education comprehension: verbalized understanding, returned demonstration, and needs further education  HOME EXERCISE PROGRAM: Access Code: [email or web address] URL:  [email or web address] Date: 08/30/2023 Prepared by: Kelly Shemia Bevel  Program Notes sit to stand with cross-body alternate knee taps 3x10   Exercises - Seated Reaching to Side and Across Body  - 1 x daily - 7 x weekly - 3 sets - 10 reps __________________________________________________________________________________________________________ Note: Objective measures were completed at Evaluation unless otherwise noted.  DIAGNOSTIC FINDINGS: reviewed in chart  COGNITION: Overall cognitive status: impaired with memory changes   SENSATION: WFL  COORDINATION: Rapid alternating movements, FTN, and HTS are equal and symmetric bilaterally  MUSCLE TONE: WNLs  POSTURE: No Significant postural limitations  LOWER EXTREMITY ROM:    WFLs per observation t/o evaluation  LOWER EXTREMITY MMT:  WFLs for all activities demonstrating functional squat to the ground and return to stand.  BED MOBILITY:  Findings: independent  TRANSFERS: Sit to stand: Complete Independence  Assistive device utilized: None      STAIRS: Patient requires CGA due to catching his toes on the edge of steps x 4 steps  GAIT: Device: None Assist: Patient needs supervision due to visual deficits with L visual field cut and possible inattention/neglect. Characteristics:  Gait is characterized by slower speeds, mostly due to navigating obstacles in clinic Gait speed 2.44 ft/sec  FUNCTIONAL TESTS:  Berg=50/56 FGA=22/30    PATIENT SURVEYS:  Planned to use patient specific activity score, however, due to memory and awareness did not ask.    __________________________________________________________________________________________________________ GOALS: Goals reviewed with patient? Yes  SHORT TERM GOALS: Target date: 09/21/23  The patient will perform HEP with cues/reminders from spouse. Baseline: initiated at eval Goal status: MET  2.  The patient will maintain single limb standing x 10 seconds R  and L sides.  Baseline:  Holds 3-4 seconds on each LE; unable Goal status: NOT MET (10/04/23)  LONG TERM GOALS: Target date: 10/06/23  The patient will perform progression of HEP with assist from family as needed. Baseline:  initiated at eval Goal status: NOT MET  2.  The patient will improve FGA to > or equal to 24/30. Baseline: 22/30 *anticipate that visual deficits will lead to continued need for supervision during some tasks; unable to perform Goal status: INITIAL (10/04/23)  3.  The patient will report resolution of  thigh discomfort with extended ambulation > 30 minutes. Baseline:  notes fatigue and achiness in bilateral quads; resolved Goal status: MET  4.  The patient will demonstrate floor<>stand due to h/o 1 fall to ensure ability to safety stand with UE support through floor. Baseline:  due to h/o fall Goal status: INITIAL  ASSESSMENT:  CLINICAL IMPRESSION: Engaged in motor control tasks to improve left attention/scanning and reactive balance strategies.  Difficulty with attending to midline and left with difficulty in scanning and attention requiring simple, tactile/verbal one-step cues.  Exhibits increased gait deviation with difficulty in sequencing LLE and unsteadiness needing SBA-CGA for ambulation on level surfaces and for transfers as well due to decreased insight/safety awareness.  Spouse reports further medical diagnostics and follow-up and will hold PT services for this time  OBJECTIVE IMPAIRMENTS: Abnormal gait, decreased balance, decreased safety awareness, impaired vision/preception, and pain.   ACTIVITY LIMITATIONS: locomotion level  PARTICIPATION LIMITATIONS: community activity and yard work  PERSONAL FACTORS: 1-2 comorbidities: diabetes, glioblastoma are also affecting patient's functional outcome.   REHAB POTENTIAL: Fair due to nature of condition  CLINICAL DECISION MAKING: Evolving/moderate complexity  EVALUATION COMPLEXITY: Moderate  PLAN:  PT  FREQUENCY: 2x/week  PT DURATION: 8 weeks  PLANNED INTERVENTIONS: 97164- PT Re-evaluation, 97110-Therapeutic exercises, 97530- Therapeutic activity, [id number]- Neuromuscular re-education, 97535- Self Care, 16109- Manual therapy, [phone]- Gait training, [phone]- Aquatic Therapy, Patient/Family education, Balance training, Stair training, and Visual/preceptual remediation/compensation  PLAN FOR NEXT SESSION: spouse reports hold on sessions for now as they follow-up with medical team   10:12 AM, 10/04/23 M. Kelly Sakiyah Shur, PT, DPT Physical Therapist- [place] Office Number: [phone]

## 2023-10-05 ENCOUNTER — Inpatient Hospital Stay: Admitting: Licensed Clinical Social Worker

## 2023-10-05 ENCOUNTER — Encounter: Payer: Self-pay | Admitting: *Deleted

## 2023-10-05 ENCOUNTER — Other Ambulatory Visit: Payer: Self-pay | Admitting: *Deleted

## 2023-10-05 ENCOUNTER — Inpatient Hospital Stay
Admission: RE | Admit: 2023-10-05 | Discharge: 2023-10-05 | Disposition: A | Discharge status: Home / Self Care | Payer: Self-pay | Source: Ambulatory Visit | Attending: Internal Medicine | Admitting: Internal Medicine

## 2023-10-05 DIAGNOSIS — Z9889 Other specified postprocedural states: Secondary | ICD-10-CM | POA: Diagnosis not present

## 2023-10-05 DIAGNOSIS — C719 Malignant neoplasm of brain, unspecified: Secondary | ICD-10-CM

## 2023-10-05 DIAGNOSIS — C713 Malignant neoplasm of parietal lobe: Secondary | ICD-10-CM | POA: Diagnosis not present

## 2023-10-05 DIAGNOSIS — G9389 Other specified disorders of brain: Secondary | ICD-10-CM | POA: Diagnosis not present

## 2023-10-05 NOTE — Progress Notes (Signed)
 Requested [facility] share images from MRI brain done on 10/05/23 to [facility].  Email sent to [employer].

## 2023-10-05 NOTE — Progress Notes (Signed)
 [facility] CSW Progress Note  Visual merchandiser spoke w/ pt's spouse regarding pt's increased need for supervision and significant decline over the past month.  CSW discussed at length w/ pt the benefits pt is entitled to through Medicare vs custodial needs.  Pt has a daughter in [place] and 2 sons in [place] who are presently visiting.  5/30 pt has f/u at [facility] and will discuss treatment options moving forward at that time.  CSW contacted [employer] on behalf of pt to request they reach back out to pt's wife for support.  CSW emailed a list of private pay home care companies to pt's wife to review.  CSW to follow up w/ pt's wife after the appointment w/ [facility] to discuss further.       Quenton Bruns, LCSW Clinical Social Worker [facility]

## 2023-10-06 DIAGNOSIS — C713 Malignant neoplasm of parietal lobe: Secondary | ICD-10-CM | POA: Diagnosis not present

## 2023-10-10 ENCOUNTER — Inpatient Hospital Stay: Attending: Internal Medicine | Admitting: Licensed Clinical Social Worker

## 2023-10-10 DIAGNOSIS — C719 Malignant neoplasm of brain, unspecified: Secondary | ICD-10-CM

## 2023-10-10 NOTE — Progress Notes (Incomplete)
  Radiation Oncology         [phone] ________________________________  Name: Javier Graham MRN: [id number]  Date of Service: 10/16/2023  DOB: 12/16/1955  Post Treatment Telephone Note  Diagnosis:  Glioblastoma, IDH-wildtype of the right parietal lobe (as documented in provider EOT note)   The patient {WAS/WAS [phone]::"was not"} available for call today.  The patient {Desc; did/not:3044021} note fatigue during radiation. The patient {Desc; did/not:3044021} note hair loss or skin changes in the field of radiation during therapy. The patient {ACTION; IS/IS WUJ:[id number]} taking dexamethasone. The patient {DOES_DOES GNF:62130} have symptoms of  weakness or loss of control of the extremities. The patient {DOES_DOES QMV:78469} have symptoms of headache. The patient {DOES_DOES GEX:52841} have symptoms of seizure or uncontrolled movement. The patient {DOES_DOES LKG:40102} have symptoms of changes in vision. The patient {DOES_DOES VOZ:36644} have changes in speech. The patient {DOES_DOES IHK:74259} have confusion.   The patient was counseled that {he/she/they:23295} will be contacted by our brain and spine navigator to schedule surveillance imaging. The patient was encouraged to call if {he/she/they:23295} have not received a call to schedule imaging, or if {he/she/they:23295}develops concerns or questions regarding radiation. The patient will also continue to follow up with Dr. Mark Sil in medical oncology.

## 2023-10-10 NOTE — Progress Notes (Signed)
 [facility] CSW Progress Note  Clinical Child psychotherapist contacted pt's wife by phone to check in.  Pt's wife confirmed she received the list of private pay home care agencies to explore options that would provide some respite.  Family and friends are able to provide some assistance in the coming weeks.  Per pt's wife pt does seem a bit clearer in the morning since starting steroids, but by afternoon he is incoherent w/ significant balance issues.  Emotional support provided.  CSW to continue to provide ongoing support throughout treatment as appropriate.        Quenton Bruns, LCSW Clinical Social Worker [facility]

## 2023-10-12 ENCOUNTER — Encounter: Payer: Self-pay | Admitting: Internal Medicine

## 2023-10-12 ENCOUNTER — Inpatient Hospital Stay: Admitting: Internal Medicine

## 2023-10-12 ENCOUNTER — Inpatient Hospital Stay

## 2023-10-12 VITALS — BP 117/76 | HR 86 | Temp 97.3°F | Resp 20 | Wt 200.6 lb

## 2023-10-12 DIAGNOSIS — C719 Malignant neoplasm of brain, unspecified: Secondary | ICD-10-CM | POA: Diagnosis not present

## 2023-10-12 DIAGNOSIS — C713 Malignant neoplasm of parietal lobe: Secondary | ICD-10-CM | POA: Diagnosis not present

## 2023-10-12 DIAGNOSIS — R262 Difficulty in walking, not elsewhere classified: Secondary | ICD-10-CM | POA: Diagnosis not present

## 2023-10-12 DIAGNOSIS — R5383 Other fatigue: Secondary | ICD-10-CM | POA: Diagnosis not present

## 2023-10-12 DIAGNOSIS — Z79630 Long term (current) use of alkylating agent: Secondary | ICD-10-CM | POA: Insufficient documentation

## 2023-10-12 LAB — CBC WITH DIFFERENTIAL (CANCER CENTER ONLY)
Abs Immature Granulocytes: 0.02 10*3/uL (ref 0.00–0.07)
Basophils Absolute: 0 10*3/uL (ref 0.0–0.1)
Basophils Relative: 0 %
Eosinophils Absolute: 0.1 10*3/uL (ref 0.0–0.5)
Eosinophils Relative: 1 %
HCT: 37.6 % — ABNORMAL LOW (ref 39.0–52.0)
Hemoglobin: 13.5 g/dL (ref 13.0–17.0)
Immature Granulocytes: 0 %
Lymphocytes Relative: 15 %
Lymphs Abs: 1 10*3/uL (ref 0.7–4.0)
MCH: 31.3 pg (ref 26.0–34.0)
MCHC: 35.9 g/dL (ref 30.0–36.0)
MCV: 87.2 fL (ref 80.0–100.0)
Monocytes Absolute: 0.4 10*3/uL (ref 0.1–1.0)
Monocytes Relative: 7 %
Neutro Abs: 5.1 10*3/uL (ref 1.7–7.7)
Neutrophils Relative %: 77 %
Platelet Count: 185 10*3/uL (ref 150–400)
RBC: 4.31 MIL/uL (ref 4.22–5.81)
RDW: 12.3 % (ref 11.5–15.5)
WBC Count: 6.5 10*3/uL (ref 4.0–10.5)
nRBC: 0 % (ref 0.0–0.2)

## 2023-10-12 LAB — CMP (CANCER CENTER ONLY)
ALT: 13 U/L (ref 0–44)
AST: 13 U/L — ABNORMAL LOW (ref 15–41)
Albumin: 4.3 g/dL (ref 3.5–5.0)
Alkaline Phosphatase: 48 U/L (ref 38–126)
Anion gap: 6 (ref 5–15)
BUN: 21 mg/dL (ref 8–23)
CO2: 29 mmol/L (ref 22–32)
Calcium: 9.6 mg/dL (ref 8.9–10.3)
Chloride: 104 mmol/L (ref 98–111)
Creatinine: 1.04 mg/dL (ref 0.61–1.24)
GFR, Estimated: >60 mL/min (ref >60)
Glucose, Bld: 284 mg/dL — ABNORMAL HIGH (ref 70–99)
Potassium: 3.7 mmol/L (ref 3.5–5.1)
Sodium: 139 mmol/L (ref 135–145)
Total Bilirubin: 0.7 mg/dL (ref 0.0–1.2)
Total Protein: 6.8 g/dL (ref 6.5–8.1)

## 2023-10-12 NOTE — Progress Notes (Signed)
 [facility] [address]  [place], [place] [address] [phone]   Javier Graham  Date of Service: 10/12/23 Patient Name: Javier Graham Patient MRN: [id number] Patient DOB: 06-08-1955 Provider: Mamie Searles, MD  Identifying Statement:  [name] is a 68 y.o. male with right parietal glioblastoma    Oncologic History: Oncology History  Glioblastoma, IDH-wildtype (HCC)  07/05/2023 Surgery   Craniotomy, resection at [facility] with Dr. Lydia Sams; path is glioblastoma   08/03/2023 -  Chemotherapy   Patient is on Treatment Plan : BRAIN GLIOBLASTOMA Radiation Therapy With Concurrent Temozolomide  75 mg/m2 Daily Followed By Sequential Maintenance Temozolomide  x 6-12 cycles       Biomarkers:  MGMT Unknown.  IDH 1/2 Wild type.  EGFR Unknown  TERT Unknown   Javier History: Javier Graham presents today for follow up after post-radiation MRI study.  His wife describes pretty clear worsening of left sided weakness, arm and leg.  This was mild or unnoticed previously.  He is having difficulty walking and need to "hold on the the wall".  Today he is in a wheelchair.  [facility] team started him on decadron 2mg  which has helped mildly.  Does describe ongoing fatigue.  Wife notes ongoing issues with memory, including not remembering her identity at times.  Denies seizures, headaches.    H+P (07/27/23) Patient presents to establish care locally for his new brain tumor.  He and his wife describe issues with driving navigation, some confusion which led to his assessment in the ED.  CNS imaging demonstrated a large enhancing mass within the right parietal lobe, c/w primary brain tumor.  He underwent surgery at [facility] on 2/26 with Dr. Lydia Sams; path demonstrated glioblastoma IDHwt.  Following surgery he has noticed some issues with navigating space and finding certain words.  No longer dosing Keppra or decadron.  Medications: Current Outpatient Medications on File  Prior to Visit  Medication Sig Dispense Refill   ascorbic acid (VITAMIN C) 500 MG tablet Take 500 mg by mouth daily.     Blood Glucose Monitoring Suppl (ACCU-CHEK GUIDE) w/Device KIT USE AS DIRECTED TO CHECK BLOOD GLUCOSE     clopidogrel  (PLAVIX ) 75 MG tablet Take 1 tablet (75 mg total) by mouth daily. 30 tablet 11   dapagliflozin  propanediol (FARXIGA ) 5 MG TABS tablet Take 5 mg by mouth daily. 30 tablet 11   dexamethasone (DECADRON) 2 MG tablet Take 2 mg by mouth daily.     glimepiride  (AMARYL ) 4 MG tablet Take 1 tablet (4 mg total) by mouth daily before breakfast. 30 tablet 3   lisinopril (ZESTRIL) 5 MG tablet Take 5 mg by mouth daily.     Melatonin 5 MG TABS Take 2 tablets by mouth as needed.     metFORMIN (GLUMETZA) 500 MG (MOD) 24 hr tablet Take 500 mg by mouth. Take 2 tabs twice daily     polyethylene glycol powder (GLYCOLAX/MIRALAX) 17 GM/SCOOP powder Take by mouth daily as needed.     Sennosides (SENOKOT PO) Take 1 tablet by mouth daily as needed.     temozolomide  (TEMODAR ) 140 MG capsule Take 1 capsule (140 mg total) by mouth daily. (Take with one 20 mg capsule for total daily dose of 160 mg). May take on an empty stomach to decrease nausea & vomiting. 42 capsule 0   VICTOZA  18 MG/3ML SOPN INJECT 0.3 ML'S (1.8 MG TOTAL) INTO THE SKIN DAILY 9 pen 3   ondansetron  (ZOFRAN ) 8 MG tablet Take  1 tablet (8 mg total) by mouth every 8 (eight) hours as needed for nausea or vomiting. May take 30-60 minutes prior to Temodar  administration if nausea/vomiting occurs as needed. (Patient not taking: Reported on 10/12/2023) 30 tablet 1   temozolomide  (TEMODAR ) 20 MG capsule Take 1 capsule (20 mg total) by mouth daily. (Take with one 140 mg capsule for total daily dose of 160 mg). May take on an empty stomach to decrease nausea & vomiting. (Patient not taking: Reported on 10/12/2023) 42 capsule 0   No current facility-administered medications on file prior to visit.    Allergies: No Known Allergies Past  Medical History:  Past Medical History:  Diagnosis Date   Diabetes (HCC)    Diplopia    Headache    Past Surgical History:  Past Surgical History:  Procedure Laterality Date   BUNIONECTOMY     Social History:  Social History   Socioeconomic History   Marital status: Married    Spouse name: Not on file   Number of Graham: 3   Years of education: college   Highest education level: Master's degree (e.g., MA, MS, MEng, MEd, MSW, MBA)  Occupational History   Not on file  Tobacco Use   Smoking status: Never   Smokeless tobacco: Never  Substance and Sexual Activity   Alcohol use: No    Comment: none since 1996   Drug use: No   Sexual activity: Not on file  Other Topics Concern   Not on file  Social History Narrative   Lives at home with his wife.   Right-handed.   One can of Coke Zero per day.   Social Drivers of Corporate investment banker Strain: High Risk (07/10/2023)   Received from [facility]   Overall Financial Resource Strain (CARDIA)    Difficulty of Paying Living Expenses: Hard  Food Insecurity: No Food Insecurity (07/27/2023)   Hunger Vital Sign    Worried About Running Out of Food in the Last Year: Never true    Ran Out of Food in the Last Year: Never true  Transportation Needs: No Transportation Needs (07/10/2023)   Received from [facility] - Transportation    In the past 12 months, has lack of transportation kept you from medical appointments or from getting medications?: No    Lack of Transportation (Non-Medical): No  Physical Activity: Not on file  Stress: Not on file  Social Connections: Not on file  Intimate Partner Violence: Not on file   Family History:  Family History  Problem Relation Age of Onset   Heart disease Other    Diabetes Other    Diabetes Father    Stroke Father        x 2   Heart disease Father    Diabetes Sister    Diabetes Brother    Heart disease Mother     Review of  Systems: Constitutional: Doesn't report fevers, chills or abnormal weight loss Eyes: Doesn't report blurriness of vision Ears, nose, mouth, throat, and face: Doesn't report sore throat Respiratory: Doesn't report cough, dyspnea or wheezes Cardiovascular: Doesn't report palpitation, chest discomfort  Gastrointestinal:  Doesn't report nausea, constipation, diarrhea GU: Doesn't report incontinence Skin: Doesn't report skin rashes Neurological: Per HPI Musculoskeletal: Doesn't report joint pain Behavioral/Psych: Doesn't report anxiety  Physical Exam: Vitals:   10/12/23 1017  BP: 117/76  Pulse: 86  Resp: 20  Temp: (!) 97.3 F (36.3 C)  SpO2: 97%  KPS: 70. General: Alert, cooperative, pleasant, in no acute distress Head: Normal EENT: No conjunctival injection or scleral icterus.  Lungs: Resp effort normal Cardiac: Regular rate Abdomen: Non-distended abdomen Skin: No rashes cyanosis or petechiae. Extremities: No clubbing or edema  Neurologic Exam: Mental Status: Awake, alert, attentive to examiner. Oriented to self and environment. Language is fluent with intact comprehension.  Cranial Nerves: Visual acuity is grossly normal. Visual fields are full. Extra-ocular movements intact. No ptosis. Face is symmetric Motor: Tone and bulk are normal. Power is 4+/5 in left arm and leg. Reflexes are symmetric, no pathologic reflexes present.  Sensory: Intact to light touch Gait: Dystaxic   Labs: I have reviewed the data as listed    Component Value Date/Time   NA 139 10/12/2023 0927   NA 139 12/19/2018 0858   K 3.7 10/12/2023 0927   CL 104 10/12/2023 0927   CO2 29 10/12/2023 0927   GLUCOSE 284 (H) 10/12/2023 0927   BUN 21 10/12/2023 0927   BUN 14 12/19/2018 0858   CREATININE 1.04 10/12/2023 0927   CALCIUM 9.6 10/12/2023 0927   PROT 6.8 10/12/2023 0927   PROT 6.8 12/19/2018 0858   ALBUMIN 4.3 10/12/2023 0927   ALBUMIN 4.6 12/19/2018 0858   AST 13 (L) 10/12/2023 0927   ALT 13  10/12/2023 0927   ALKPHOS 48 10/12/2023 0927   BILITOT 0.7 10/12/2023 0927   GFRNONAA >60 10/12/2023 0927   GFRAA 90 12/19/2018 0858   Lab Results  Component Value Date   WBC 6.5 10/12/2023   NEUTROABS 5.1 10/12/2023   HGB 13.5 10/12/2023   HCT 37.6 (L) 10/12/2023   MCV 87.2 10/12/2023   PLT 185 10/12/2023     Assessment/Plan Glioblastoma, IDH-wildtype (HCC) - Plan: MR BRAIN W WO CONTRAST  YEHUDAH STANDING is clinically progressive today, with worsened left sided motor function.  He has now completed 6 weeks of IMRT and concurrent Temodar .  MRI brain demonstrates changes c/w progression of disease vs pseudo-progression.  We are still waiting on images to cross over through powershare.  Recommended close imaging surveillance, with repeat MRI in 1 month.  We recommended initiating treatment with cycle #1 Temozolomide  150 mg/m2, on for five days and off for twenty three days in twenty eight day cycles. The patient will have a complete blood count performed on days 21 and 28 of each cycle, and a comprehensive metabolic panel performed on day 28 of each cycle. Labs may need to be performed more often. Zofran  will prescribed for home use for nausea/vomiting.   Informed consent was obtained verbally at bedside to proceed with oral chemotherapy.  Chemotherapy should be held for the following:  ANC less than 1,000  Platelets less than 100,000  LFT or creatinine greater than 2x ULN  If clinical concerns/contraindications develop  Recommended increasing decadron to 4mg  daily given modest response to 2mg  dose level starting last week.  We ask that JACORION KLEM return to clinic in 1 months with brain MRI for review prior to cycle #2.  We will also touch base with him in 1 week to continue adjusting decadron.  All questions were answered. The patient knows to call the clinic with any problems, questions or concerns. No barriers to learning were detected.  The total time spent in the  encounter was 40 minutes and more than 50% was on counseling and review of test results   Mamie Searles, MD Medical Director of Neuro-Oncology [facility] [place] 10/12/23  1:26 PM

## 2023-10-15 ENCOUNTER — Other Ambulatory Visit: Payer: Self-pay

## 2023-10-16 ENCOUNTER — Inpatient Hospital Stay
Admission: RE | Admit: 2023-10-16 | Discharge: 2023-10-16 | Disposition: A | Discharge status: Home / Self Care | Payer: Self-pay | Source: Ambulatory Visit | Attending: Internal Medicine | Admitting: Internal Medicine

## 2023-10-16 ENCOUNTER — Encounter: Payer: Self-pay | Admitting: *Deleted

## 2023-10-16 ENCOUNTER — Ambulatory Visit
Admission: RE | Admit: 2023-10-16 | Discharge: 2023-10-16 | Disposition: A | Discharge status: Home / Self Care | Source: Ambulatory Visit | Attending: Internal Medicine | Admitting: Internal Medicine

## 2023-10-16 ENCOUNTER — Other Ambulatory Visit: Payer: Self-pay | Admitting: *Deleted

## 2023-10-16 DIAGNOSIS — C713 Malignant neoplasm of parietal lobe: Secondary | ICD-10-CM | POA: Insufficient documentation

## 2023-10-16 DIAGNOSIS — Z510 Encounter for antineoplastic radiation therapy: Secondary | ICD-10-CM | POA: Insufficient documentation

## 2023-10-16 DIAGNOSIS — C719 Malignant neoplasm of brain, unspecified: Secondary | ICD-10-CM

## 2023-10-16 NOTE — Progress Notes (Signed)
 MRI brain images done at [facility] on 10/03/23 requested again. Previous images received were from February.  PC to [facility] & specifically requested imaging from 5/27.  [facility] states they will send the images through to [facility].  Email to [employer] requesting they link images to outside imaging order placed today.

## 2023-10-19 ENCOUNTER — Telehealth: Payer: Self-pay

## 2023-10-19 ENCOUNTER — Other Ambulatory Visit: Payer: Self-pay | Admitting: Internal Medicine

## 2023-10-19 ENCOUNTER — Other Ambulatory Visit: Payer: Self-pay

## 2023-10-19 ENCOUNTER — Telehealth: Payer: Self-pay | Admitting: Pharmacy Technician

## 2023-10-19 ENCOUNTER — Inpatient Hospital Stay: Admitting: Internal Medicine

## 2023-10-19 ENCOUNTER — Other Ambulatory Visit (HOSPITAL_COMMUNITY): Payer: Self-pay

## 2023-10-19 ENCOUNTER — Other Ambulatory Visit: Payer: Self-pay | Admitting: Pharmacy Technician

## 2023-10-19 DIAGNOSIS — C719 Malignant neoplasm of brain, unspecified: Secondary | ICD-10-CM

## 2023-10-19 DIAGNOSIS — R531 Weakness: Secondary | ICD-10-CM

## 2023-10-19 DIAGNOSIS — C713 Malignant neoplasm of parietal lobe: Secondary | ICD-10-CM | POA: Diagnosis not present

## 2023-10-19 DIAGNOSIS — Z79899 Other long term (current) drug therapy: Secondary | ICD-10-CM

## 2023-10-19 MED ORDER — TEMOZOLOMIDE 140 MG PO CAPS
140.0000 mg | ORAL_CAPSULE | Freq: Every day | ORAL | 0 refills | Status: DC
  Filled 2023-10-19: qty 5, 28d supply, fill #0

## 2023-10-19 MED ORDER — ONDANSETRON HCL 8 MG PO TABS
8.0000 mg | ORAL_TABLET | Freq: Three times a day (TID) | ORAL | 1 refills | Status: DC | PRN
Start: 2023-10-19 — End: 2023-11-09
  Filled 2023-10-19: qty 30, 10d supply, fill #0

## 2023-10-19 MED ORDER — TEMOZOLOMIDE 180 MG PO CAPS: 180.0000 mg | ORAL_CAPSULE | Freq: Every day | ORAL | 0 refills | Status: DC

## 2023-10-19 MED ORDER — TEMOZOLOMIDE 140 MG PO CAPS: 140.0000 mg | ORAL_CAPSULE | Freq: Every day | ORAL | 0 refills | Status: DC

## 2023-10-19 MED ORDER — TEMOZOLOMIDE 180 MG PO CAPS
180.0000 mg | ORAL_CAPSULE | Freq: Every day | ORAL | 0 refills | Status: DC
  Filled 2023-10-19: qty 5, 28d supply, fill #0

## 2023-10-19 MED ORDER — DEXAMETHASONE 2 MG PO TABS: 2.0000 mg | ORAL_TABLET | Freq: Every day | ORAL | 1 refills | Status: DC

## 2023-10-19 NOTE — Telephone Encounter (Signed)
 Oral Oncology Pharmacist Encounter  Received new prescription for Temodar  (temozolomide ) for the treatment of IDH-wt glioblastoma, planned duration 6-12 cycles.  CBC and CMP from 10/12/23 assessed, no baseline dose adjustments required. Prescription dose and frequency assessed for appropriateness.  .  Current medication list in Epic reviewed, no DDIs with Temodar  identified:  Evaluated chart and no patient barriers to medication adherence noted.   Patient agreement for treatment documented in MD note on 10/12/23.  Prescription has been e-scribed to the [facility] for benefits analysis and approval.  Oral Oncology Clinic will continue to follow for insurance authorization, copayment issues, initial counseling and start date.   Allie Area, PharmD, BCPS PGY2 Oncology Pharmacy Resident   10/19/2023 10:12 AM Oral Oncology Clinic [phone]

## 2023-10-19 NOTE — Progress Notes (Signed)
 Oral Chemotherapy Pharmacist Encounter  Patient's wife was counseled under telephone encounter from 10/19/23.  Jude Norton, PharmD, BCPS, BCOP Hematology/Oncology Clinical Pharmacist [facility] and [facility] Oral Chemotherapy Navigation Clinics [phone] 10/19/2023 11:40 AM

## 2023-10-19 NOTE — Progress Notes (Signed)
 I connected with Javier Graham on 10/19/23 at  9:45 AM EDT by telephone visit and verified that I am speaking with the correct person using two identifiers.  I discussed the limitations, risks, security and privacy concerns of performing an evaluation and management service by telemedicine and the availability of in-person appointments. I also discussed with the patient that there may be a patient responsible charge related to this service. The patient expressed understanding and agreed to proceed.   Other persons participating in the visit and their role in the encounter:  spouse  Patient's location:  Home Provider's location:  Office Chief Complaint:  Glioblastoma, IDH-wildtype (HCC)  History of Present Ilness: Javier Graham reports clear improvement in weakness since increasing the decadron to 4mg  daily.  He is needing to hold on to walls less, more stable overall.  His memory unfortunately has not improved at all, per wife.  No other concerning changes.  Observations: Language and cognition at baseline  Assessment and Plan: Glioblastoma, IDH-wildtype (HCC)  He will dose cycle #1 of Temozolomide  tonight.  Decadron may stay at 4mg  during chemo week, then can try dropping back down to 2mg  if tolerated.  Follow Up Instructions: RTC in 3-4 weeks after upcoming MRI.  I discussed the assessment and treatment plan with the patient.  The patient was provided an opportunity to ask questions and all were answered.  The patient agreed with the plan and demonstrated understanding of the instructions.    The patient was advised to call back or seek an in-person evaluation if the symptoms worsen or if the condition fails to improve as anticipated.    [name], MD   I provided 20 minutes of non face-to-face telephone visit time during this encounter, and > 50% was spent counseling as documented under my assessment & plan.

## 2023-10-19 NOTE — Telephone Encounter (Signed)
 Oral Chemotherapy Pharmacist Encounter  I spoke with patient's wife, Caryl Clas, for overview of: Temodar  (temozolomide ) for the maintenance treatment of glioblastoma multiforme, planned duration 6-12 months of treatment.  Counseled on administration, dosing, side effects, monitoring, drug-food interactions, safe handling, storage, and disposal.  Patient will take ONE Temodar  140 mg capsule and ONE Temodar  180 mg capsule, 320 mg total daily dose, by mouth once daily, may take at bedtime and on an empty stomach to decrease nausea and vomiting.  If 1st cycle is well tolerated, patient caregiver informed that Temodar  dose may be increased to 200 mg/m2 daily for 5 days on, 23 days off, repeated every 28 days for subsequent cycles   Patient will take Temodar  daily for 5 days on, 23 days off, and repeated.   Temodar  start date: 10/19/2023    Adverse effects include but are not limited to: nausea, vomiting, GI upset, rash, and fatigue.  Nausea/Vomiting PPX: Patient will take Zofran  8mg  tablet, 1 tablet by mouth 30-60 min prior to Temodar  dose to help decrease N/V. We discussed strategies to manage constipation if they occur secondary to ondansetron  dosing such as using Miralax daily.  Reviewed importance of keeping a medication schedule and plan for any missed doses. No barriers to medication adherence identified.  Medication reconciliation performed and medication/allergy list updated.  Insurance authorization for Temodar  has been obtained. Test claim at the pharmacy revealed copayment $0 for 1st fill of Temodar . This will be ready at the [facility] outpatient pharmacy on 10/19/23. Caryl Clas states that her daughter will plan to pick up the prescription this afternoon for the patient to start tonight.   She was informed the pharmacy will reach out 5-7 days prior to needing next fill of Temodar  to coordinate continued medication acquisition to prevent break in therapy.  All questions answered.  She  voiced understanding and appreciation.   Medication education handout placed in mail for patient and caregiver. She knows to call the office with questions or concerns. Oral Chemotherapy Clinic phone number provided.    Allie Area, PharmD, BCPS PGY2 Oncology Pharmacy Resident   10/19/2023 11:45 AM

## 2023-10-19 NOTE — Telephone Encounter (Signed)
 Oral Oncology Patient Advocate Encounter  After completing a benefits investigation, prior authorization for temozolomide  is not required at this time through [place].  Patient's copay is $0 (for both strengths).     Roda Cirri, CPhT Specialty Pharmacy Patient Advocate Phone: [phone] Fax: [phone]

## 2023-10-19 NOTE — Progress Notes (Signed)
 Specialty Pharmacy Initial Fill Coordination Note  Javier Graham is a 68 y.o. male contacted today regarding refills of specialty medication(s) Temozolomide  (TEMODAR ) .  Patient requested [name] at [facility] at [place]  on 10/19/23   Medication will be filled on 10/19/23.   Patient is aware of $0 copayment.

## 2023-10-24 ENCOUNTER — Inpatient Hospital Stay (HOSPITAL_BASED_OUTPATIENT_CLINIC_OR_DEPARTMENT_OTHER): Admitting: Internal Medicine

## 2023-10-24 ENCOUNTER — Telehealth: Payer: Self-pay | Admitting: *Deleted

## 2023-10-24 ENCOUNTER — Inpatient Hospital Stay: Admitting: Licensed Clinical Social Worker

## 2023-10-24 ENCOUNTER — Encounter: Payer: Self-pay | Admitting: Internal Medicine

## 2023-10-24 DIAGNOSIS — C713 Malignant neoplasm of parietal lobe: Secondary | ICD-10-CM

## 2023-10-24 DIAGNOSIS — C719 Malignant neoplasm of brain, unspecified: Secondary | ICD-10-CM

## 2023-10-24 MED ORDER — QUETIAPINE FUMARATE 50 MG PO TABS: 50.0000 mg | ORAL_TABLET | Freq: Every day | ORAL | 1 refills | Status: DC

## 2023-10-24 NOTE — Progress Notes (Signed)
 [facility] CSW Progress Note  Visual merchandiser received an email from pt's wife regarding pt's agitation.  Per spouse pt has become extremely agitated and it is no longer possible to reason w/ him when he becomes uncooperative.  Pt's spouse does not have anyone at the home to assist her at this time.  [facility] in [place] are reviewing pt's case to see if they may be able to provide some aide assistance in the home.  Dr. Mark Sil to follow up w/ pt's wife to review possible changes to medication.  CSW to follow up w/ spouse 6/18.       Quenton Bruns, LCSW Clinical Social Worker [facility]

## 2023-10-24 NOTE — Telephone Encounter (Signed)
 PC to patient's wife, Caryl Clas - informed her we received her MyChart message & Dr Mark Sil is asking if they can do a phone visit at 4:00 today.  Caryl Clas states this will be fine, appointment scheduled.

## 2023-10-24 NOTE — Progress Notes (Signed)
 I connected with Javier Graham on 10/24/23 at  4:00 PM EDT by telephone visit and verified that I am speaking with the correct person using two identifiers.  I discussed the limitations, risks, security and privacy concerns of performing an evaluation and management service by telemedicine and the availability of in-person appointments. I also discussed with the patient that there may be a patient responsible charge related to this service. The patient expressed understanding and agreed to proceed.  Other persons participating in the visit and their role in the encounter:  wife   Patient's location:  Home Provider's location:  Office Chief Complaint:  Glioblastoma, IDH-wildtype (HCC)  History of Present Ilness: Javier Graham spouse reports notable worsening of behavior issues.  She reports inappropriate urination habits, going in the sink, tub.  He is very agitated and times cruel to family friends.  He remains disoriented even around the home.  Symptoms are all worse in the evening.  Sleep has been very poor overall.  She doesn't feel like the second 2mg  dose of decadron  is helping as much at this time.  Observations: Language and cognition at baseline  Assessment and Plan: Glioblastoma, IDH-wildtype (HCC)  Recommended decreasing decadron  back to 2mg  daily.  Also recommending trial of seroquel 50mg  HS for mood lability, agitation, sundowning behavior and poor sleep.  Follow Up Instructions:  He is due for MRI brain on 7/1.  We will touch base with Caryl Clas next week to assess response to these changes and continue adjusting medications for these behavioral issues secondary to tumor and treatment.  I discussed the assessment and treatment plan with the patient.  The patient was provided an opportunity to ask questions and all were answered.  The patient agreed with the plan and demonstrated understanding of the instructions.    The patient was advised to call back or seek an in-person evaluation  if the symptoms worsen or if the condition fails to improve as anticipated.    Zaray Gatchel K Charlen Bakula, MD   I provided 20 minutes of non face-to-face telephone visit time during this encounter, and > 50% was spent counseling as documented under my assessment & plan.

## 2023-10-25 ENCOUNTER — Inpatient Hospital Stay: Admitting: Licensed Clinical Social Worker

## 2023-10-25 ENCOUNTER — Encounter: Payer: Self-pay | Admitting: Internal Medicine

## 2023-10-25 DIAGNOSIS — C719 Malignant neoplasm of brain, unspecified: Secondary | ICD-10-CM

## 2023-10-25 NOTE — Progress Notes (Signed)
 [facility] CSW Progress Note  Clinical Child psychotherapist contacted pt's wife by phone to check in.  Per spouse pt started Seroquel last night.  Approximately 1/2 an hour after taking Seroquel pt was standing by the dresser and appeared frozen.  Pt's wife unable to move him from the dresser for a couple of hours.  Pt had 2 falls, 1 overnight and 1 in the early morning requiring EMS to come to the house to assist wife with getting pt to bed.  A referral was placed to the paramedicine program in [facility] by EMS.  CSW spoke w/ the social worker from the paramedicine program to provide additional insight.  On Friday the home care agency is scheduled to come by to see what kind of assistance they may be able to offer.  CSW advised pt's wife to keep Dr. Mark Sil informed if the medication does not appear to be helping.  Pt to follow up w/ Dr. Mark Sil next week.  CSW to continue to provide support as appropriate.       Javier Bruns, LCSW Clinical Social Worker [facility]

## 2023-10-26 ENCOUNTER — Other Ambulatory Visit: Payer: Self-pay | Admitting: Internal Medicine

## 2023-10-26 MED ORDER — BACLOFEN 5 MG PO TABS: 5.0000 mg | ORAL_TABLET | Freq: Four times a day (QID) | ORAL | 0 refills | Status: DC | PRN

## 2023-10-30 ENCOUNTER — Emergency Department (HOSPITAL_COMMUNITY)

## 2023-10-30 ENCOUNTER — Inpatient Hospital Stay (HOSPITAL_BASED_OUTPATIENT_CLINIC_OR_DEPARTMENT_OTHER): Admitting: Internal Medicine

## 2023-10-30 ENCOUNTER — Encounter (HOSPITAL_COMMUNITY): Payer: Self-pay | Admitting: *Deleted

## 2023-10-30 ENCOUNTER — Other Ambulatory Visit: Payer: Self-pay

## 2023-10-30 ENCOUNTER — Inpatient Hospital Stay (HOSPITAL_COMMUNITY)
Admission: EM | Admit: 2023-10-30 | Discharge: 2023-12-08 | DRG: 100 | Disposition: E | Attending: Student | Admitting: Student

## 2023-10-30 DIAGNOSIS — Z7902 Long term (current) use of antithrombotics/antiplatelets: Secondary | ICD-10-CM

## 2023-10-30 DIAGNOSIS — R0989 Other specified symptoms and signs involving the circulatory and respiratory systems: Secondary | ICD-10-CM | POA: Diagnosis not present

## 2023-10-30 DIAGNOSIS — G40909 Epilepsy, unspecified, not intractable, without status epilepticus: Secondary | ICD-10-CM | POA: Diagnosis not present

## 2023-10-30 DIAGNOSIS — D320 Benign neoplasm of cerebral meninges: Secondary | ICD-10-CM | POA: Diagnosis not present

## 2023-10-30 DIAGNOSIS — R410 Disorientation, unspecified: Principal | ICD-10-CM

## 2023-10-30 DIAGNOSIS — N179 Acute kidney failure, unspecified: Secondary | ICD-10-CM | POA: Diagnosis not present

## 2023-10-30 DIAGNOSIS — Z515 Encounter for palliative care: Secondary | ICD-10-CM

## 2023-10-30 DIAGNOSIS — D496 Neoplasm of unspecified behavior of brain: Secondary | ICD-10-CM | POA: Diagnosis not present

## 2023-10-30 DIAGNOSIS — Z7189 Other specified counseling: Secondary | ICD-10-CM

## 2023-10-30 DIAGNOSIS — Z923 Personal history of irradiation: Secondary | ICD-10-CM | POA: Diagnosis not present

## 2023-10-30 DIAGNOSIS — E1141 Type 2 diabetes mellitus with diabetic mononeuropathy: Secondary | ICD-10-CM | POA: Diagnosis present

## 2023-10-30 DIAGNOSIS — G9340 Encephalopathy, unspecified: Secondary | ICD-10-CM | POA: Diagnosis present

## 2023-10-30 DIAGNOSIS — C719 Malignant neoplasm of brain, unspecified: Secondary | ICD-10-CM

## 2023-10-30 DIAGNOSIS — R9089 Other abnormal findings on diagnostic imaging of central nervous system: Secondary | ICD-10-CM | POA: Diagnosis not present

## 2023-10-30 DIAGNOSIS — Z833 Family history of diabetes mellitus: Secondary | ICD-10-CM

## 2023-10-30 DIAGNOSIS — R1310 Dysphagia, unspecified: Secondary | ICD-10-CM | POA: Diagnosis present

## 2023-10-30 DIAGNOSIS — Z66 Do not resuscitate: Secondary | ICD-10-CM | POA: Diagnosis not present

## 2023-10-30 DIAGNOSIS — H4912 Fourth [trochlear] nerve palsy, left eye: Secondary | ICD-10-CM | POA: Diagnosis present

## 2023-10-30 DIAGNOSIS — Z7984 Long term (current) use of oral hypoglycemic drugs: Secondary | ICD-10-CM

## 2023-10-30 DIAGNOSIS — E1165 Type 2 diabetes mellitus with hyperglycemia: Secondary | ICD-10-CM | POA: Diagnosis not present

## 2023-10-30 DIAGNOSIS — G9389 Other specified disorders of brain: Secondary | ICD-10-CM

## 2023-10-30 DIAGNOSIS — G40901 Epilepsy, unspecified, not intractable, with status epilepticus: Secondary | ICD-10-CM | POA: Diagnosis not present

## 2023-10-30 DIAGNOSIS — R509 Fever, unspecified: Secondary | ICD-10-CM | POA: Diagnosis not present

## 2023-10-30 DIAGNOSIS — Z5986 Financial insecurity: Secondary | ICD-10-CM

## 2023-10-30 DIAGNOSIS — G40101 Localization-related (focal) (partial) symptomatic epilepsy and epileptic syndromes with simple partial seizures, not intractable, with status epilepticus: Principal | ICD-10-CM | POA: Diagnosis present

## 2023-10-30 DIAGNOSIS — Z9221 Personal history of antineoplastic chemotherapy: Secondary | ICD-10-CM | POA: Diagnosis not present

## 2023-10-30 DIAGNOSIS — Z823 Family history of stroke: Secondary | ICD-10-CM

## 2023-10-30 DIAGNOSIS — Z7952 Long term (current) use of systemic steroids: Secondary | ICD-10-CM

## 2023-10-30 DIAGNOSIS — I959 Hypotension, unspecified: Secondary | ICD-10-CM | POA: Diagnosis not present

## 2023-10-30 DIAGNOSIS — E875 Hyperkalemia: Secondary | ICD-10-CM | POA: Diagnosis not present

## 2023-10-30 DIAGNOSIS — L8990 Pressure ulcer of unspecified site, unspecified stage: Secondary | ICD-10-CM | POA: Insufficient documentation

## 2023-10-30 DIAGNOSIS — E872 Acidosis, unspecified: Secondary | ICD-10-CM | POA: Diagnosis not present

## 2023-10-30 DIAGNOSIS — Z794 Long term (current) use of insulin: Secondary | ICD-10-CM

## 2023-10-30 DIAGNOSIS — Z79899 Other long term (current) drug therapy: Secondary | ICD-10-CM

## 2023-10-30 DIAGNOSIS — I10 Essential (primary) hypertension: Secondary | ICD-10-CM | POA: Diagnosis not present

## 2023-10-30 DIAGNOSIS — R220 Localized swelling, mass and lump, head: Secondary | ICD-10-CM | POA: Diagnosis not present

## 2023-10-30 DIAGNOSIS — E119 Type 2 diabetes mellitus without complications: Secondary | ICD-10-CM | POA: Diagnosis not present

## 2023-10-30 DIAGNOSIS — R9082 White matter disease, unspecified: Secondary | ICD-10-CM | POA: Diagnosis not present

## 2023-10-30 DIAGNOSIS — G9341 Metabolic encephalopathy: Secondary | ICD-10-CM | POA: Diagnosis not present

## 2023-10-30 DIAGNOSIS — E871 Hypo-osmolality and hyponatremia: Secondary | ICD-10-CM | POA: Diagnosis not present

## 2023-10-30 DIAGNOSIS — J9602 Acute respiratory failure with hypercapnia: Secondary | ICD-10-CM | POA: Diagnosis not present

## 2023-10-30 DIAGNOSIS — Z8249 Family history of ischemic heart disease and other diseases of the circulatory system: Secondary | ICD-10-CM | POA: Diagnosis not present

## 2023-10-30 DIAGNOSIS — R6510 Systemic inflammatory response syndrome (SIRS) of non-infectious origin without acute organ dysfunction: Secondary | ICD-10-CM | POA: Diagnosis not present

## 2023-10-30 DIAGNOSIS — G4700 Insomnia, unspecified: Secondary | ICD-10-CM | POA: Diagnosis present

## 2023-10-30 DIAGNOSIS — R569 Unspecified convulsions: Secondary | ICD-10-CM | POA: Diagnosis not present

## 2023-10-30 DIAGNOSIS — T380X5A Adverse effect of glucocorticoids and synthetic analogues, initial encounter: Secondary | ICD-10-CM | POA: Diagnosis not present

## 2023-10-30 DIAGNOSIS — Z85841 Personal history of malignant neoplasm of brain: Secondary | ICD-10-CM | POA: Diagnosis not present

## 2023-10-30 LAB — CBC WITH DIFFERENTIAL/PLATELET
Abs Immature Granulocytes: 0.05 10*3/uL (ref 0.00–0.07)
Basophils Absolute: 0 10*3/uL (ref 0.0–0.1)
Basophils Relative: 0 %
Eosinophils Absolute: 0 10*3/uL (ref 0.0–0.5)
Eosinophils Relative: 0 %
HCT: 44.3 % (ref 39.0–52.0)
Hemoglobin: 15.6 g/dL (ref 13.0–17.0)
Immature Granulocytes: 0 %
Lymphocytes Relative: 11 %
Lymphs Abs: 1.2 10*3/uL (ref 0.7–4.0)
MCH: 31.1 pg (ref 26.0–34.0)
MCHC: 35.2 g/dL (ref 30.0–36.0)
MCV: 88.2 fL (ref 80.0–100.0)
Monocytes Absolute: 0.7 10*3/uL (ref 0.1–1.0)
Monocytes Relative: 6 %
Neutro Abs: 9.2 10*3/uL — ABNORMAL HIGH (ref 1.7–7.7)
Neutrophils Relative %: 83 %
Platelets: 251 10*3/uL (ref 150–400)
RBC: 5.02 MIL/uL (ref 4.22–5.81)
RDW: 12.4 % (ref 11.5–15.5)
WBC: 11.2 10*3/uL — ABNORMAL HIGH (ref 4.0–10.5)
nRBC: 0 % (ref 0.0–0.2)

## 2023-10-30 LAB — COMPREHENSIVE METABOLIC PANEL WITH GFR
ALT: 35 U/L (ref 0–44)
AST: 40 U/L (ref 15–41)
Albumin: 4.2 g/dL (ref 3.5–5.0)
Alkaline Phosphatase: 54 U/L (ref 38–126)
Anion gap: 7 (ref 5–15)
BUN: 19 mg/dL (ref 8–23)
CO2: 24 mmol/L (ref 22–32)
Calcium: 9.7 mg/dL (ref 8.9–10.3)
Chloride: 103 mmol/L (ref 98–111)
Creatinine, Ser: 0.85 mg/dL (ref 0.61–1.24)
GFR, Estimated: >60 mL/min (ref >60)
Glucose, Bld: 167 mg/dL — ABNORMAL HIGH (ref 70–99)
Potassium: 4.4 mmol/L (ref 3.5–5.1)
Sodium: 134 mmol/L — ABNORMAL LOW (ref 135–145)
Total Bilirubin: 1.1 mg/dL (ref 0.0–1.2)
Total Protein: 7.5 g/dL (ref 6.5–8.1)

## 2023-10-30 LAB — CBG MONITORING, ED: Glucose-Capillary: 167 mg/dL — ABNORMAL HIGH (ref 70–99)

## 2023-10-30 LAB — PROTIME-INR
INR: 1 (ref 0.8–1.2)
Prothrombin Time: 13.2 s (ref 11.4–15.2)

## 2023-10-30 LAB — APTT: aPTT: 26 s (ref 24–36)

## 2023-10-30 MED ORDER — ACETAMINOPHEN 650 MG RE SUPP
650.0000 mg | Freq: Four times a day (QID) | RECTAL | Status: DC | PRN
  Administered 2023-11-07 – 2023-11-08 (×3): 650 mg via RECTAL
  Filled 2023-10-30 (×3): qty 1

## 2023-10-30 MED ORDER — DEXAMETHASONE SODIUM PHOSPHATE 4 MG/ML IJ SOLN
4.0000 mg | Freq: Once | INTRAMUSCULAR | Status: AC
  Administered 2023-10-30: 4 mg via INTRAVENOUS
  Filled 2023-10-30: qty 1

## 2023-10-30 MED ORDER — DEXAMETHASONE SODIUM PHOSPHATE 10 MG/ML IJ SOLN
8.0000 mg | Freq: Three times a day (TID) | INTRAMUSCULAR | Status: DC
  Administered 2023-10-31 – 2023-11-08 (×27): 8 mg via INTRAVENOUS
  Filled 2023-10-30 (×4): qty 1
  Filled 2023-10-30: qty 0.8
  Filled 2023-10-30: qty 1
  Filled 2023-10-30: qty 0.8
  Filled 2023-10-30: qty 1
  Filled 2023-10-30: qty 0.8
  Filled 2023-10-30 (×11): qty 1
  Filled 2023-10-30 (×2): qty 0.8
  Filled 2023-10-30 (×7): qty 1

## 2023-10-30 MED ORDER — DEXAMETHASONE 2 MG PO TABS: 2.0000 mg | ORAL_TABLET | Freq: Every day | ORAL | Status: DC

## 2023-10-30 MED ORDER — QUETIAPINE FUMARATE 50 MG PO TABS
50.0000 mg | ORAL_TABLET | Freq: Every day | ORAL | Status: DC
  Administered 2023-10-31 – 2023-11-04 (×6): 50 mg via ORAL
  Filled 2023-10-30 (×6): qty 1

## 2023-10-30 MED ORDER — LISINOPRIL 2.5 MG PO TABS
5.0000 mg | ORAL_TABLET | Freq: Every day | ORAL | Status: DC
  Administered 2023-10-31 – 2023-11-05 (×6): 5 mg via ORAL
  Filled 2023-10-30 (×6): qty 1

## 2023-10-30 MED ORDER — SODIUM CHLORIDE 0.9 % IV BOLUS
1000.0000 mL | Freq: Once | INTRAVENOUS | Status: AC
  Administered 2023-10-30: 1000 mL via INTRAVENOUS

## 2023-10-30 MED ORDER — ACETAMINOPHEN 325 MG PO TABS
650.0000 mg | ORAL_TABLET | Freq: Four times a day (QID) | ORAL | Status: DC | PRN
Start: 2023-10-30 — End: 2023-11-09

## 2023-10-30 MED ORDER — ONDANSETRON HCL 4 MG PO TABS: 4.0000 mg | ORAL_TABLET | Freq: Four times a day (QID) | ORAL | Status: DC | PRN

## 2023-10-30 MED ORDER — ONDANSETRON HCL 4 MG/2ML IJ SOLN
4.0000 mg | Freq: Four times a day (QID) | INTRAMUSCULAR | Status: DC | PRN
  Administered 2023-11-03: 4 mg via INTRAVENOUS
  Filled 2023-10-30: qty 2

## 2023-10-30 MED ORDER — ENOXAPARIN SODIUM 40 MG/0.4ML IJ SOSY
40.0000 mg | PREFILLED_SYRINGE | INTRAMUSCULAR | Status: DC
  Administered 2023-11-01 – 2023-11-07 (×7): 40 mg via SUBCUTANEOUS
  Filled 2023-10-30 (×7): qty 0.4

## 2023-10-30 NOTE — H&P (Signed)
 History and Physical    MIR FULLILOVE FMW:[id number] DOB: 1956/02/03 DOA: 10/30/2023  PCP: Lari Elspeth BRAVO, MD   Chief Complaint: lorre  HPI: Javier Graham is a 68 y.o. male with medical history significant of, status post resection, diabetes who presents emergency department at the recommendation of his oncologist.  He has been experiencing a general decline in function and difficulty communicating with family.  He has had changes in his speech and his been unable to take care of himself.  Etiology of his new onset mental status changes was unclear and so was recommended that he present to the ER for further evaluation.  Oncology recommended brain MRI, labs, IV hydration and Decadron .  He presented to the emergency department where he was found to be febrile he was medically stable.  Labs were obtained on presentation which showed urinalysis pending, glucose 167, sodium 134, WBC 11.2, hemoglobin 15.6, INR 1.0.  Patient underwent CT head which showed postoperative changes.  Chest x-ray showed no acute findings.  Patient was admitted for further workup.  On evaluation he was altered and unable to provide much history.  Neurologic exam revealed no focal deficits.   Review of Systems: Review of Systems  Constitutional: Negative.  Negative for chills and fever.  HENT: Negative.    Eyes: Negative.   Respiratory: Negative.    Cardiovascular: Negative.  Negative for chest pain.  Gastrointestinal: Negative.   Genitourinary: Negative.   Musculoskeletal:  Negative for myalgias.  Skin: Negative.   Neurological:  Positive for speech change.  Psychiatric/Behavioral: Negative.       As per HPI otherwise 10 point review of systems negative.   No Known Allergies  Past Medical History:  Diagnosis Date   Diabetes (HCC)    Diplopia    Headache     Past Surgical History:  Procedure Laterality Date   BUNIONECTOMY       reports that he has never smoked. He has never used smokeless tobacco. He  reports that he does not drink alcohol and does not use drugs.  Family History  Problem Relation Age of Onset   Heart disease Other    Diabetes Other    Diabetes Father    Stroke Father        x 2   Heart disease Father    Diabetes Sister    Diabetes Brother    Heart disease Mother     Prior to Admission medications   Medication Sig Start Date End Date Taking? Authorizing Provider  insulin lispro (HUMALOG) 100 UNIT/ML KwikPen INJECT 0-8 UNITS SUBCUTANEOUSLY 3 TIMES DAILY WITH MEALS ACCORDING TO SLIDING SCALE INSULIN. IF BLOOD SUGAR IS 201-250:2 UNITS, 251-300:4 UNITS, 301-350:6 UNITS, 351-400:8 UNITS, >400:10 UNITS 07/07/23  Yes [provider]  ascorbic acid (VITAMIN C) 500 MG tablet Take 500 mg by mouth daily.    [provider]  Baclofen  5 MG TABS Take 1 tablet (5 mg total) by mouth every 6 (six) hours as needed (cramping). 10/26/23   Vaslow, Zachary K, MD  Blood Glucose Monitoring Suppl (ACCU-CHEK GUIDE) w/Device KIT USE AS DIRECTED TO CHECK BLOOD GLUCOSE 07/07/23   [provider]  clopidogrel  (PLAVIX ) 75 MG tablet Take 1 tablet (75 mg total) by mouth daily. 12/19/18   Onita Duos, MD  dapagliflozin  propanediol (FARXIGA ) 5 MG TABS tablet Take 5 mg by mouth daily. 06/20/16   Kassie Mallick, MD  dexamethasone  (DECADRON ) 2 MG tablet Take 1 tablet (2 mg total) by mouth daily. 10/19/23  Vaslow, Zachary K, MD  glimepiride  (AMARYL ) 4 MG tablet Take 1 tablet (4 mg total) by mouth daily before breakfast. 06/17/16   Kassie Mallick, MD  lisinopril (ZESTRIL) 5 MG tablet Take 5 mg by mouth daily.    [provider]  Melatonin 5 MG TABS Take 2 tablets by mouth as needed.    [provider]  metFORMIN (GLUMETZA) 500 MG (MOD) 24 hr tablet Take 500 mg by mouth. Take 2 tabs twice daily    [provider]  ondansetron  (ZOFRAN ) 8 MG tablet Take 1 tablet (8 mg total) by mouth every 8 (eight) hours as needed for nausea or vomiting. May take 30-60 minutes prior  to Temodar  administration if nausea/vomiting occurs as needed. 10/19/23   Vaslow, Zachary K, MD  polyethylene glycol powder (GLYCOLAX/MIRALAX) 17 GM/SCOOP powder Take by mouth daily as needed.    [provider]  QUEtiapine (SEROQUEL) 50 MG tablet Take 1 tablet (50 mg total) by mouth at bedtime. 10/24/23   Vaslow, Zachary K, MD  Sennosides (SENOKOT PO) Take 1 tablet by mouth daily as needed.    [provider]  temozolomide  (TEMODAR ) 140 MG capsule Take 1 capsule (140 mg total) by mouth daily. (Take with ONE 180 mg capsule for total daily dose of 320 mg). Take for 5 days on then 23 days off. May take on an empty stomach to decrease nausea & vomiting. 10/19/23   Vaslow, Zachary K, MD  temozolomide  (TEMODAR ) 180 MG capsule Take 1 capsule (180 mg total) by mouth daily. (Take with ONE 140 mg capsule for a total daily dose of 320 mg). Take for 5 days on then 23 days off. May take on an empty stomach to decrease nausea & vomiting. 10/19/23   Buckley Arthea POUR, MD  VICTOZA  18 MG/3ML SOPN INJECT 0.3 ML'S (1.8 MG TOTAL) INTO THE SKIN DAILY 01/16/17   Kassie Mallick, MD    Physical Exam: Vitals:   10/30/23 1757 10/30/23 1817 10/30/23 2000 10/30/23 2216  BP:  126/86 (!) 130/107   Pulse:  (!) 102 95   Resp:  16 (!) 21   Temp:  98.2 F (36.8 C)  (!) 97.5 F (36.4 C)  TempSrc:  Oral  Oral  SpO2:  98% 100%   Weight: 90.7 kg     Height: 6' 1 (1.854 m)      Physical Exam HENT:     Head: Normocephalic.     Nose: Nose normal.     Mouth/Throat:     Mouth: Mucous membranes are moist.   Eyes:     Pupils: Pupils are equal, round, and reactive to light.    Cardiovascular:     Rate and Rhythm: Normal rate and regular rhythm.     Pulses: Normal pulses.  Pulmonary:     Effort: Pulmonary effort is normal.  Abdominal:     General: Abdomen is flat.   Musculoskeletal:        General: Normal range of motion.     Cervical back: Normal range of motion.   Skin:    General: Skin is warm.    Neurological:     Mental Status: He is alert. He is disoriented.     Sensory: Sensory deficit present.     Motor: Weakness present.   Psychiatric:        Mood and Affect: Mood normal.        Labs on Admission: I have personally reviewed the patients's labs and imaging studies.  Assessment/Plan Principal  Problem:   Encephalopathy acute   #Acute encephalopathy, most likely secondary to glioblastoma - Patient had brain resection for glioblastoma in February - Worsening of neurologic symptoms including confusion, speech changes and agitation - Workup in the emergency department largely unrevealing  Plan: Obtain MRI Continue IV Decadron  Follow-up remaining infectious labs including urinalysis  # Insomnia-continue Seroquel  # Hypertension-continue lisinopril  -Patient will need med rec checked in morning as patient was unable to provide much history.    Admission status: Inpatient Med-Surg  Certification: The appropriate patient status for this patient is INPATIENT. Inpatient status is judged to be reasonable and necessary in order to provide the required intensity of service to ensure the patient's safety. The patient's presenting symptoms, physical exam findings, and initial radiographic and laboratory data in the context of their chronic comorbidities is felt to place them at high risk for further clinical deterioration. Furthermore, it is not anticipated that the patient will be medically stable for discharge from the hospital within 2 midnights of admission.   * I certify that at the point of admission it is my clinical judgment that the patient will require inpatient hospital care spanning beyond 2 midnights from the point of admission due to high intensity of service, high risk for further deterioration and high frequency of surveillance required.DEWAINE Lamar Dess MD [facility] If 7PM-7AM, please contact night-coverage [email or web address]  10/30/2023, 11:25  PM

## 2023-10-30 NOTE — ED Provider Triage Note (Signed)
 Emergency Medicine Provider Triage Evaluation Note  Javier Graham , a 68 y.o. male  was evaluated in triage.  Pt complains of AMS. Hx of Glioblastoma, follows with Vaslow. Over the weekend progressive AMS. Not sleeping. Cannot walk. Losing control bowel or bladder. Rec to come in for imaging, admission  Review of Systems  Positive: ams Negative:   Physical Exam  BP 128/81   Pulse (!) 115   Temp 97.8 F (36.6 C) (Oral)   Resp 17   Ht 6' 1 (1.854 m)   Wt 90.7 kg   SpO2 99%   BMI 26.39 kg/m  Gen:   Awake, no distress   Resp:  Normal effort  MSK:   Moves extremities without difficulty  Other:  Confused   Medical Decision Making  Medically screening exam initiated at 6:03 PM.  Appropriate orders placed.  [name] was informed that the remainder of the evaluation will be completed by another provider, this initial triage assessment does not replace that evaluation, and the importance of remaining in the ED until their evaluation is complete.  ams   [name], PA-C 10/30/23 1804

## 2023-10-30 NOTE — Progress Notes (Signed)
 I connected with Javier Graham on 10/30/23 at  2:30 PM EDT by telephone visit and verified that I am speaking with the correct person using two identifiers.  I discussed the limitations, risks, security and privacy concerns of performing an evaluation and management service by telemedicine and the availability of in-person appointments. I also discussed with the patient that there may be a patient responsible charge related to this service. The patient expressed understanding and agreed to proceed.  Other persons participating in the visit and their role in the encounter:  spouse  Patient's location:  Home Provider's location:  Office Chief Complaint:  Glioblastoma, IDH-wildtype (HCC)  History of Present Ilness: Javier Graham has experienced further decline, per his wife.  He is unable to provide history directly.  Memory is very poor, ability to move around the house is impaired.  She does not feel able to bring him into clinic or get into the car without significant assistance.  Sleep remains very poor despite dosing the seroquel 50mg  at night.  She does note that they ran out of the decadron  and he had not dosed this at all over the weekend.  Observations: Language and cognition at baseline  Assessment and Plan: Glioblastoma, IDH-wildtype (HCC)  Javier Graham is experiencing general and focal decline; we recommended evaluation in local ED given lack of ability to transport to clinic or imaging center here.  Etiology of decline is not fully clear, we would not expect this degree of dysfunction from disease present on MRI from 10/05/23.  Further tumor progression/infiltration is a possibility.   For inpatient: would recommend CNS imaging, brain MRI, routine labs, hydration, IV decadron , and sleep aid if needed.  She is agreeable with this, will touch base with us  tomorrow or have admitting team reach out.  Follow Up Instructions: RTC TBD  I discussed the assessment and treatment plan with the  patient.  The patient was provided an opportunity to ask questions and all were answered.  The patient agreed with the plan and demonstrated understanding of the instructions.    The patient was advised to call back or seek an in-person evaluation if the symptoms worsen or if the condition fails to improve as anticipated.    Andria Head K Kamisha Ell, MD   I provided 20 minutes of non face-to-face telephone visit time during this encounter, and > 50% was spent counseling as documented under my assessment & plan.

## 2023-10-30 NOTE — ED Triage Notes (Signed)
 BIB family from home for AMS. H/o brain tumor removal at [facility] 2/26 by Dr. Tobie. Here for gradual progressive decline, including in decreased function, decreased ability to walk, loss of control of bowel and bladder, loss of cognitive filter, increased agitation. Mentions intermittent sweats. Denies fever, or NVD. Scheduled for MRI 7/1. Instructed to come to ED by Dr. Buckley. Alert, NAD, calm at this time. Sitting in w/c.

## 2023-10-31 ENCOUNTER — Encounter: Payer: Self-pay | Admitting: Internal Medicine

## 2023-10-31 ENCOUNTER — Other Ambulatory Visit: Payer: Self-pay

## 2023-10-31 ENCOUNTER — Inpatient Hospital Stay (HOSPITAL_COMMUNITY)

## 2023-10-31 DIAGNOSIS — G9340 Encephalopathy, unspecified: Secondary | ICD-10-CM | POA: Diagnosis not present

## 2023-10-31 NOTE — Plan of Care (Signed)
   Problem: Health Behavior/Discharge Planning: Goal: Ability to manage health-related needs will improve Outcome: Progressing   Problem: Clinical Measurements: Goal: Ability to maintain clinical measurements within normal limits will improve Outcome: Progressing

## 2023-10-31 NOTE — Hospital Course (Addendum)
 Javier Graham is a 68 y.o. male with a history of glioblastoma s/p resection, radiation, and temozolomide , T2DM, HTN, insomnia who presented to the ED on 10/30/2023 with cognitive and motor dysfunction initially thought to be due to CNS inflammation following radiation.   The hospitalist group was then called to assess the patient for admission the hospital.  Neurooncology was consulted and recommended high dose steroids.  Arrangements were additionally made for initiation of Avastin every 2 weeks after discharge.   Despite initiation of steroids, patient continued to exhibit substantial lethargy and confusion.  PT evaluation was obtained and it was felt that patient would benefit from skilled physical therapy in a skilled nursing facility.   Patient continued to clinically deteriorate with progressively worsening lethargy.  On 6/29, the patient began to exhibit twitching of the jaw concerning for focal seizures and patient was initiated on intravenous Keppra.  Despite this, patient began to exhibit recurrent seizure activity concerning for status epilepticus prompting teleneurology consultation with Dr. Lindzen, administration of intravenous Ativan and transfer the patient to [facility], ICU for continuous EEG monitoring.  Patient's progressively worsening presentation suggests underlying status epilepticus as the cause of the patient's progressively worsening mentation over the past several days.

## 2023-10-31 NOTE — TOC Initial Note (Signed)
 Transition of Care [facility]) - Initial/Assessment Note    Patient Details  Name: Javier Graham MRN: [id number] Date of Birth: 18-Feb-1956  [facility] CM/SW Contact:    Javier ONEIDA Sharps, LCSW Phone Number: 10/31/2023, 9:32 AM  Clinical Narrative:                 Pt from home with spouse. Pt continues medical workup. Pt disoriented x4 at this time. TOC following for dc needs.     Barriers to Discharge: Continued Medical Work up   Patient Goals and CMS Choice Patient states their goals for this hospitalization and ongoing recovery are:: return home   Choice offered to / list presented to : NA      Expected Discharge Plan and Services In-house Referral: NA Discharge Planning Services: NA   Living arrangements for the past 2 months: Single Family Home                 DME Arranged: N/A DME Agency: NA       HH Arranged: NA HH Agency: NA        Prior Living Arrangements/Services Living arrangements for the past 2 months: Single Family Home Lives with:: Spouse Patient language and need for interpreter reviewed:: Yes Do you feel safe going back to the place where you live?: Yes      Need for Family Participation in Patient Care: Yes (Comment) Care giver support system in place?: Yes (comment)   Criminal Activity/Legal Involvement Pertinent to Current Situation/Hospitalization: No - Comment as needed  Activities of Daily Living   ADL Screening (condition at time of admission) Independently performs ADLs?: No Does the patient have a NEW difficulty with bathing/dressing/toileting/self-feeding that is expected to last >3 days?: Yes (Initiates electronic notice to provider for possible OT consult) Does the patient have a NEW difficulty with getting in/out of bed, walking, or climbing stairs that is expected to last >3 days?: Yes (Initiates electronic notice to provider for possible PT consult) Does the patient have a NEW difficulty with communication that is  expected to last >3 days?: Yes (Initiates electronic notice to provider for possible SLP consult) Is the patient deaf or have difficulty hearing?: No Does the patient have difficulty seeing, even when wearing glasses/contacts?: No Does the patient have difficulty concentrating, remembering, or making decisions?: Yes  Permission Sought/Granted                  Emotional Assessment         Alcohol / Substance Use: Not Applicable Psych Involvement: No (comment)  Admission diagnosis:  Confusion [R41.0] Encephalopathy acute [G93.40] Patient Active Problem List   Diagnosis Date Noted   Encephalopathy acute 10/30/2023   Glioblastoma, IDH-wildtype (HCC) 07/27/2023   Diplopia 12/19/2018   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 [place] - [address] [place] [address] Phone: [phone] Fax: [phone]     Social Drivers of Health (SDOH) Social History: SDOH Screenings   Food Insecurity: Patient Unable To Answer (10/31/2023)  Housing: Unknown (10/31/2023)  Transportation Needs: Patient Unable To Answer (10/31/2023)  Utilities: Patient Unable To Answer (10/31/2023)  Financial Resource Strain: High Risk (07/10/2023)   Received from [facility]  Social Connections: Unknown (10/31/2023)  Tobacco Use: Low Risk  (10/30/2023)   SDOH Interventions:     Readmission Risk Interventions    10/31/2023    8:49 AM  Readmission Risk Prevention Plan  Post Dischage Appt Complete  Medication Screening Complete  Transportation Screening Complete

## 2023-10-31 NOTE — ED Notes (Signed)
 Floor nurse called

## 2023-10-31 NOTE — Progress Notes (Signed)
 PROGRESS NOTE    Javier Graham  FMW:[id number] DOB: 1956-03-06 DOA: 10/30/2023 PCP: Lari Elspeth BRAVO, MD   Brief Narrative: Javier Graham is a 68 y.o. male with a history of glioblastoma s/p resection, diabetes mellitus type 2, hypertension, insomnia.  Patient presented secondary to concern of altered mental status of unknown etiology.  Concerned this may be related to patient's history of glioblastoma.  Decadron  IV started.  MRI brain ordered.   Assessment and Plan:  Acute encephalopathy Unclear etiology but presumed secondary to history of glioblastoma. Metabolic panel mostly unremarkable and CBC significant for a very mild leukocytosis. No symptoms per patient. No urinalysis obtained. CT imaging without acute process. Patient is on Baclofen  as needed. He's also on Farxiga , Novolog and glimepiride , however no hypoglycemia noted. Patient started on decadron  and IV fluids. -Continue decadron  -Follow-up MRI brain -Oncology recommendations -Will check a urinalysis with reflex culture  History of glioblastoma Patient follows with neuro-oncology.  Patient is currently on chemotherapy is s/p resection and radiation.  Diabetes mellitus type 2 Seems to be well-controlled, however last hemoglobin A1c is 7.0% from 2020.  Patient is on Farxiga , NovoLog, glimepiride  as an outpatient. -Start sliding scale insulin  Insomnia -Continue Seroquel  Primary hypertension -Continue lisinopril  Microvascular left trochlea nerve palsy Patient is managed with Plavix  as an outpatient which was held on admission.   DVT prophylaxis: Lovenox Code Status:   Code Status: Full Code Family Communication: None at bedside. Called wife but no answer Disposition Plan: Discharge home vs rehab in 2-3 days pending improvement of mental status   Consultants:  Oncology  Procedures:  None  Antimicrobials: None    Subjective: Patient reports no issues today. Not able to give me his wife's  name.  Objective: BP (!) 135/90 (BP Location: Right Arm)   Pulse 94   Temp 98.1 F (36.7 C) (Oral)   Resp 20   Ht 6' 1 (1.854 m)   Wt 90.7 kg   SpO2 98%   BMI 26.39 kg/m   Examination:  General exam: Appears calm and comfortable Respiratory system: Clear to auscultation. Respiratory effort normal. Cardiovascular system: S1 & S2 heard, RRR. No murmurs, rubs, gallops or clicks. Gastrointestinal system: Abdomen is nondistended, soft and nontender. Normal bowel sounds heard. Central nervous system: Alert and oriented to self. Musculoskeletal: No edema. No calf tenderness Skin: No cyanosis. No rashes   Data Reviewed: I have personally reviewed following labs and imaging studies  CBC Lab Results  Component Value Date   WBC 11.2 (H) 10/30/2023   RBC 5.02 10/30/2023   HGB 15.6 10/30/2023   HCT 44.3 10/30/2023   MCV 88.2 10/30/2023   MCH 31.1 10/30/2023   PLT 251 10/30/2023   MCHC 35.2 10/30/2023   RDW 12.4 10/30/2023   LYMPHSABS 1.2 10/30/2023   MONOABS 0.7 10/30/2023   EOSABS 0.0 10/30/2023   BASOSABS 0.0 10/30/2023     Last metabolic panel Lab Results  Component Value Date   NA 134 (L) 10/30/2023   K 4.4 10/30/2023   CL 103 10/30/2023   CO2 24 10/30/2023   BUN 19 10/30/2023   CREATININE 0.85 10/30/2023   GLUCOSE 167 (H) 10/30/2023   GFRNONAA >60 10/30/2023   GFRAA 90 12/19/2018   CALCIUM 9.7 10/30/2023   PROT 7.5 10/30/2023   ALBUMIN 4.2 10/30/2023   LABGLOB 2.2 12/19/2018   AGRATIO 2.1 12/19/2018   BILITOT 1.1 10/30/2023   ALKPHOS 54 10/30/2023   AST 40 10/30/2023   ALT 35  10/30/2023   ANIONGAP 7 10/30/2023    GFR: Estimated Creatinine Clearance: 95.3 mL/min (by C-G formula based on SCr of 0.85 mg/dL).  No results found for this or any previous visit (from the past 240 hours).    Radiology Studies: CT Head Wo Contrast Result Date: 10/30/2023 CLINICAL DATA:  Initial evaluation for acute delirium. EXAM: CT HEAD WITHOUT CONTRAST TECHNIQUE:  Contiguous axial images were obtained from the base of the skull through the vertex without intravenous contrast. RADIATION DOSE REDUCTION: This exam was performed according to the departmental dose-optimization program which includes automated exposure control, adjustment of the mA and/or kV according to patient size and/or use of iterative reconstruction technique. COMPARISON:  Prior MRI from 10/16/2023 FINDINGS: Brain: Postoperative changes from prior right posterior craniotomy. Heterogeneous hypodensity with cystic change within the underlying right parieto-occipital region, consistent with known history of GBM. Irregular involvement of the anterior right frontal cortex, better appreciated on prior brain MRI. 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 No acute intracranial hemorrhage. No acute large vessel territory infarct. No other visible mass lesion. No significant mass effect or midline shift. Stable ventricular size without hydrocephalus. No extra-axial fluid collection. Vascular: No abnormal hyperdense vessel. Skull: Scalp soft tissues demonstrate no acute finding. Prior right posterior craniotomy. Calvarium otherwise intact. Sinuses/Orbits: Globes and orbital soft tissues within normal limits. Paranasal sinuses are largely clear. No significant mastoid effusion. Other: None. IMPRESSION: 1. No acute intracranial abnormality. 2. Postoperative changes from prior right posterior craniotomy with underlying right parieto-occipital GBM, better appreciated on prior brain MRI. No significant mass effect or midline shift. 3. 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 Electronically Signed   By: Morene Hoard M.D.   On: 10/30/2023 20:56   DG Chest Portable 1 View Result Date: 10/30/2023 CLINICAL DATA:  Altered mental status. EXAM: PORTABLE CHEST 1 VIEW COMPARISON:  None Available. FINDINGS: The heart size and mediastinal contours are within  normal limits. Low lung volumes are noted. Both lungs are clear. The visualized skeletal structures are unremarkable. IMPRESSION: Low lung volumes without active cardiopulmonary disease. Electronically Signed   By: Suzen Dials M.D.   On: 10/30/2023 18:53      LOS: 1 day    Elgin Lam, MD [facility] 10/31/2023, 12:50 PM   If 7PM-7AM, please contact night-coverage [email or web address]

## 2023-10-31 NOTE — Progress Notes (Signed)
 DISCONTINUE ON PATHWAY REGIMEN - Neuro     One cycle, concurrent with RT:     Temozolomide   **Always confirm dose/schedule in your pharmacy ordering system**  PRIOR TREATMENT: [id number]: Radiation Therapy with Concurrent Temozolomide 75 mg/m2 Daily x 6 Weeks, Followed by Adjuvant Temozolomide  START ON PATHWAY REGIMEN - Neuro     A cycle is every 14 days:     Bevacizumab-xxxx   **Always confirm dose/schedule in your pharmacy ordering system**  Patient Characteristics: Glioma, Glioblastoma, IDH-wildtype, Recurrent or Progressive, Nonsurgical Candidate, Systemic Therapy Candidate, BRAF V600E Mutation Negative/Unknown and NTRK Fusion Negative/Unknown Disease Classification: Glioma Disease Classification: Glioblastoma, IDH-wildtype Disease Status: Recurrent or Progressive Treatment Classification: Nonsurgical Candidate Treatment (Nonsurgical/Adjuvant): Systemic Therapy Candidate NTRK Gene Fusion Status: Negative BRAF V600E Mutation Status: Negative Intent of Therapy: Non-Curative / Palliative Intent, Discussed with Patient

## 2023-10-31 NOTE — ED Provider Notes (Signed)
 [facility] AT [facility] Provider Note   CSN: [id number] Arrival date & time: 10/30/23  1747     Patient presents with: Altered Mental Status   Javier Graham is a 68 y.o. male.   68 year old male presenting emergency department for generalized decline with confusion with history of glioblastoma.  Was directed to emergency department Dr. Buckley.  Family notes that symptoms are progressive in nature without acute change.  No fevers chills rhinorrhea runny nose sore throat cough.  No abdominal pain.  Seemingly more confused unable to walk very well.   Altered Mental Status      Prior to Admission medications   Medication Sig Start Date End Date Taking? Authorizing Provider  insulin lispro (HUMALOG) 100 UNIT/ML KwikPen INJECT 0-8 UNITS SUBCUTANEOUSLY 3 TIMES DAILY WITH MEALS ACCORDING TO SLIDING SCALE INSULIN. IF BLOOD SUGAR IS 201-250:2 UNITS, 251-300:4 UNITS, 301-350:6 UNITS, 351-400:8 UNITS, >400:10 UNITS 07/07/23  Yes [provider]  ascorbic acid (VITAMIN C) 500 MG tablet Take 500 mg by mouth daily.    [provider]  Baclofen  5 MG TABS Take 1 tablet (5 mg total) by mouth every 6 (six) hours as needed (cramping). 10/26/23   Vaslow, Zachary K, MD  Blood Glucose Monitoring Suppl (ACCU-CHEK GUIDE) w/Device KIT USE AS DIRECTED TO CHECK BLOOD GLUCOSE 07/07/23   [provider]  clopidogrel  (PLAVIX ) 75 MG tablet Take 1 tablet (75 mg total) by mouth daily. 12/19/18   Onita Duos, MD  dapagliflozin  propanediol (FARXIGA ) 5 MG TABS tablet Take 5 mg by mouth daily. 06/20/16   Kassie Mallick, MD  dexamethasone  (DECADRON ) 2 MG tablet Take 1 tablet (2 mg total) by mouth daily. 10/19/23   Vaslow, Zachary K, MD  glimepiride  (AMARYL ) 4 MG tablet Take 1 tablet (4 mg total) by mouth daily before breakfast. 06/17/16   Kassie Mallick, MD  lisinopril (ZESTRIL) 5 MG tablet Take 5 mg by mouth daily.    [provider]  Melatonin 5 MG TABS Take 2  tablets by mouth as needed.    [provider]  metFORMIN (GLUMETZA) 500 MG (MOD) 24 hr tablet Take 500 mg by mouth. Take 2 tabs twice daily    [provider]  ondansetron  (ZOFRAN ) 8 MG tablet Take 1 tablet (8 mg total) by mouth every 8 (eight) hours as needed for nausea or vomiting. May take 30-60 minutes prior to Temodar  administration if nausea/vomiting occurs as needed. 10/19/23   Vaslow, Zachary K, MD  polyethylene glycol powder (GLYCOLAX/MIRALAX) 17 GM/SCOOP powder Take by mouth daily as needed.    [provider]  QUEtiapine (SEROQUEL) 50 MG tablet Take 1 tablet (50 mg total) by mouth at bedtime. 10/24/23   Vaslow, Zachary K, MD  Sennosides (SENOKOT PO) Take 1 tablet by mouth daily as needed.    [provider]  temozolomide  (TEMODAR ) 140 MG capsule Take 1 capsule (140 mg total) by mouth daily. (Take with ONE 180 mg capsule for total daily dose of 320 mg). Take for 5 days on then 23 days off. May take on an empty stomach to decrease nausea & vomiting. 10/19/23   Vaslow, Zachary K, MD  temozolomide  (TEMODAR ) 180 MG capsule Take 1 capsule (180 mg total) by mouth daily. (Take with ONE 140 mg capsule for a total daily dose of 320 mg). Take for 5 days on then 23 days off. May take on an empty stomach to decrease nausea & vomiting. 10/19/23   Vaslow, Zachary K, MD  VICTOZA  18 MG/3ML SOPN INJECT 0.3 ML'S (1.8 MG TOTAL) INTO THE SKIN DAILY 01/16/17   Kassie Mallick, MD    Allergies: Patient has no known allergies.    Review of Systems  Updated Vital Signs BP (!) 130/107   Pulse 95   Temp (!) 97.5 F (36.4 C) (Oral)   Resp (!) 21   Ht 6' 1 (1.854 m)   Wt 90.7 kg   SpO2 100%   BMI 26.39 kg/m   Physical Exam Vitals and nursing note reviewed.  Constitutional:      General: He is not in acute distress.    Appearance: He is not toxic-appearing.  HENT:     Head: Normocephalic.     Nose: Nose normal.     Mouth/Throat:     Mouth: Mucous membranes are moist.    Eyes:     Conjunctiva/sclera: Conjunctivae normal.    Cardiovascular:     Rate and Rhythm: Normal rate.  Pulmonary:     Effort: Pulmonary effort is normal.  Abdominal:     General: Abdomen is flat.   Musculoskeletal:        General: Normal range of motion.   Skin:    General: Skin is warm.     Capillary Refill: Capillary refill takes less than 2 seconds.   Neurological:     Mental Status: He is alert and oriented to person, place, and time.   Psychiatric:        Mood and Affect: Mood normal.        Behavior: Behavior normal.     (all labs ordered are listed, but only abnormal results are displayed) Labs Reviewed  COMPREHENSIVE METABOLIC PANEL WITH GFR - Abnormal; Notable for the following components:      Result Value   Sodium 134 (*)    Glucose, Bld 167 (*)    All other components within normal limits  CBC WITH DIFFERENTIAL/PLATELET - Abnormal; Notable for the following components:   WBC 11.2 (*)    Neutro Abs 9.2 (*)    All other components within normal limits  CBG MONITORING, ED - Abnormal; Notable for the following components:   Glucose-Capillary 167 (*)    All other components within normal limits  PROTIME-INR  APTT  URINALYSIS, ROUTINE W REFLEX MICROSCOPIC    EKG: None  Radiology: CT Head Wo Contrast Result Date: 10/30/2023 CLINICAL DATA:  Initial evaluation for acute delirium. EXAM: CT HEAD WITHOUT CONTRAST TECHNIQUE: Contiguous axial images were obtained from the base of the skull through the vertex without intravenous contrast. RADIATION DOSE REDUCTION: This exam was performed according to the departmental dose-optimization program which includes automated exposure control, adjustment of the mA and/or kV according to patient size and/or use of iterative reconstruction technique. COMPARISON:  Prior MRI from 10/16/2023 FINDINGS: Brain: Postoperative changes from prior right posterior craniotomy. Heterogeneous hypodensity with cystic change within the  underlying right parieto-occipital region, consistent with known history of GBM. Irregular involvement of the anterior right frontal cortex, better appreciated on prior brain MRI. 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 No acute intracranial hemorrhage. No acute large vessel territory infarct. No other visible mass lesion. No significant mass effect or midline shift. Stable ventricular size without hydrocephalus. No extra-axial fluid collection. Vascular: No abnormal hyperdense vessel. Skull: Scalp soft tissues demonstrate no acute finding. Prior right posterior craniotomy. Calvarium otherwise intact. Sinuses/Orbits: Globes and orbital soft tissues within normal limits. Paranasal sinuses are largely clear. No significant mastoid effusion. Other: None.  IMPRESSION: 1. No acute intracranial abnormality. 2. Postoperative changes from prior right posterior craniotomy with underlying right parieto-occipital GBM, better appreciated on prior brain MRI. No significant mass effect or midline shift. 3. 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 Electronically Signed   By: Morene Hoard M.D.   On: 10/30/2023 20:56   DG Chest Portable 1 View Result Date: 10/30/2023 CLINICAL DATA:  Altered mental status. EXAM: PORTABLE CHEST 1 VIEW COMPARISON:  None Available. FINDINGS: The heart size and mediastinal contours are within normal limits. Low lung volumes are noted. Both lungs are clear. The visualized skeletal structures are unremarkable. IMPRESSION: Low lung volumes without active cardiopulmonary disease. Electronically Signed   By: Suzen Dials M.D.   On: 10/30/2023 18:53     Procedures   Medications Ordered in the ED  lisinopril (ZESTRIL) tablet 5 mg (has no administration in time range)  QUEtiapine (SEROQUEL) tablet 50 mg (50 mg Oral Given 10/31/23 0002)  enoxaparin (LOVENOX) injection 40 mg (has no administration in time range)   acetaminophen  (TYLENOL ) tablet 650 mg (has no administration in time range)    Or  acetaminophen  (TYLENOL ) suppository 650 mg (has no administration in time range)  ondansetron  (ZOFRAN ) tablet 4 mg (has no administration in time range)    Or  ondansetron  (ZOFRAN ) injection 4 mg (has no administration in time range)  dexamethasone  (DECADRON ) injection 8 mg (8 mg Intravenous Given 10/31/23 0002)  sodium chloride 0.9 % bolus 1,000 mL (0 mLs Intravenous Stopped 10/30/23 1950)  dexamethasone  (DECADRON ) injection 4 mg (4 mg Intravenous Given 10/30/23 1858)    Clinical Course as of 10/31/23 0020  Mon Oct 30, 2023  1816 Dr. Buckley note from today: KRISTINA BERTONE is experiencing general and focal decline; we recommended evaluation in local ED given lack of ability to transport to clinic or imaging center here.  Etiology of decline is not fully clear, we would not expect this degree of dysfunction from disease present on MRI from 10/05/23.  Further tumor progression/infiltration is a possibility.    For inpatient: would recommend CNS imaging, brain MRI, routine labs, hydration, IV decadron , and sleep aid if needed.  She is agreeable with this, will touch base with us  tomorrow or have admitting team reach out.  [TY]    Clinical Course User Index [TY] Neysa Caron PARAS, DO                                 Medical Decision Making C33-year-old male present emergency department for altered mental status/confusion in the setting of glioblastoma.  Per chart review follows with Dr. Buckley; see ED course.  Vital signs reassuring.  Exam without infectious etiology.  Mild leukocytosis, but labs otherwise reassuring.  No infectious process. Plan to admit oncology's note.  per the direction of s identified.  CT head stable.  Chest x-ray without pneumonia pneumothorax.  Given Decadron  IV fluids   Amount and/or Complexity of Data Reviewed Independent Historian:     Details: Family member provided history External  Data Reviewed:     Details: CV: Labs: ordered. Radiology: ordered.  Risk Prescription drug management. Decision regarding hospitalization.       Final diagnoses:  Confusion    ED Discharge Orders     None          Neysa Caron PARAS, DO 10/31/23 0020

## 2023-10-31 NOTE — Consult Note (Signed)
 [facility] Neuro-Oncology Consult Note  Patient Care Team: Burdine, Elspeth BRAVO, MD as PCP - Diedre Alix Charleston, MD as Consulting Physician (Neurosurgery)  CHIEF COMPLAINTS/PURPOSE OF CONSULTATION:  Glioblastoma Altered Mental Status  Oncology History  Glioblastoma, IDH-wildtype (HCC)  07/05/2023 Surgery   Craniotomy, resection at [facility] with Dr. Tobie; path is glioblastoma   08/03/2023 -  Chemotherapy   Patient is on Treatment Plan : BRAIN GLIOBLASTOMA Radiation Therapy With Concurrent Temozolomide  75 mg/m2 Daily Followed By Sequential Maintenance Temozolomide  x 6-12 cycles      HISTORY OF PRESENTING ILLNESS:  Javier Graham 68 y.o. male presents with ongoing cognitive and fucntional decline.  His wife needed to call EMS to transport him out of the home.  He had been urinating around the home inappropriately without insight or awareness.  Yelling at family/friends and not recognizing his wife.  It has been several days since he was able to walk at all on his own.  Seroquel and decadron  had been started this week but problems did not improve meaningfully.  He was able to obtain MRI study earlier today.  MEDICAL HISTORY:  Past Medical History:  Diagnosis Date   Diabetes (HCC)    Diplopia    Headache     SURGICAL HISTORY: Past Surgical History:  Procedure Laterality Date   BUNIONECTOMY      SOCIAL HISTORY: Social History   Socioeconomic History   Marital status: Married    Spouse name: Not on file   Number of children: 3   Years of education: college   Highest education level: Master's degree (e.g., MA, MS, MEng, MEd, MSW, MBA)  Occupational History   Not on file  Tobacco Use   Smoking status: Never   Smokeless tobacco: Never  Substance and Sexual Activity   Alcohol use: No    Comment: none since 1996   Drug use: No   Sexual activity: Not on file  Other Topics Concern   Not on file  Social History Narrative   Lives at home with his wife.    Right-handed.   One can of Coke Zero per day.   Social Drivers of Corporate investment banker Strain: High Risk (07/10/2023)   Received from [facility]   Overall Financial Resource Strain (CARDIA)    Difficulty of Paying Living Expenses: Hard  Food Insecurity: Patient Unable To Answer (10/31/2023)   Hunger Vital Sign    Worried About Running Out of Food in the Last Year: Patient unable to answer    Ran Out of Food in the Last Year: Patient unable to answer  Transportation Needs: Patient Unable To Answer (10/31/2023)   PRAPARE - Transportation    Lack of Transportation (Medical): Patient unable to answer    Lack of Transportation (Non-Medical): Patient unable to answer  Physical Activity: Not on file  Stress: Not on file  Social Connections: Unknown (10/31/2023)   Social Connection and Isolation Panel    Frequency of Communication with Friends and Family: Patient unable to answer    Frequency of Social Gatherings with Friends and Family: Patient unable to answer    Attends Religious Services: Patient unable to answer    Active Member of Clubs or Organizations: Patient unable to answer    Attends Banker Meetings: Patient unable to answer    Marital Status: Married  Intimate Partner Violence: Patient Unable To Answer (10/31/2023)   Humiliation, Afraid, Rape, and Kick questionnaire    Fear of Current or  Ex-Partner: Patient unable to answer    Emotionally Abused: Patient unable to answer    Physically Abused: Patient unable to answer    Sexually Abused: Patient unable to answer    FAMILY HISTORY: Family History  Problem Relation Age of Onset   Heart disease Other    Diabetes Other    Diabetes Father    Stroke Father        x 2   Heart disease Father    Diabetes Sister    Diabetes Brother    Heart disease Mother     ALLERGIES:  has no known allergies.  MEDICATIONS:  Current Facility-Administered Medications  Medication Dose Route Frequency  Provider Last Rate Last Admin   acetaminophen  (TYLENOL ) tablet 650 mg  650 mg Oral Q6H PRN Dorrell, Robert, MD       Or   acetaminophen  (TYLENOL ) suppository 650 mg  650 mg Rectal Q6H PRN Dorrell, Robert, MD       dexamethasone  (DECADRON ) injection 8 mg  8 mg Intravenous Q8H Dorrell, Robert, MD   8 mg at 10/31/23 1350   enoxaparin (LOVENOX) injection 40 mg  40 mg Subcutaneous Q24H Dorrell, Robert, MD       lisinopril (ZESTRIL) tablet 5 mg  5 mg Oral Daily Dorrell, Robert, MD   5 mg at 10/31/23 9182   ondansetron  (ZOFRAN ) tablet 4 mg  4 mg Oral Q6H PRN Dena Charleston, MD       Or   ondansetron  (ZOFRAN ) injection 4 mg  4 mg Intravenous Q6H PRN Dorrell, Robert, MD       QUEtiapine (SEROQUEL) tablet 50 mg  50 mg Oral QHS Dorrell, Robert, MD   50 mg at 10/31/23 0002    REVIEW OF SYSTEMS:   Limited by AMS   PHYSICAL EXAMINATION: Vitals:   10/31/23 0610 10/31/23 1003  BP: (!) 146/98 (!) 135/90  Pulse: (!) 107 94  Resp: 18 20  Temp: 98 F (36.7 C) 98.1 F (36.7 C)  SpO2: 97% 98%   KPS: 60. General: Alert, cooperative, pleasant, Head: Normal EENT: No conjunctival injection or scleral icterus. Oral mucosa moist Lungs: Resp effort normal Cardiac: Regular rate and rhythm Abdomen: Soft, non-distended abdomen Skin: No rashes cyanosis or petechiae. Extremities: No clubbing or edema  NEUROLOGIC EXAM: Mental Status: Awake, alert, attentive to examiner. Not oriented to environment. Language is impaired with regards to comprehension, fluency is moderate.  Thinking is disorganized.  Dense left sided neglect and agnosia are noted.  Cranial Nerves: Visual acuity is grossly normal. Visual fields are full. Extra-ocular movements intact. No ptosis. Face is symmetric, tongue midline. Motor: Tone and bulk are normal. Power is 4/5 in the left leg. Reflexes are symmetric, no pathologic reflexes present. Intact finger to nose bilaterally Sensory: Intact to light touch and temperature Gait: Non  ambulatory   LABORATORY DATA:  I have reviewed the data as listed Lab Results  Component Value Date   WBC 11.2 (H) 10/30/2023   HGB 15.6 10/30/2023   HCT 44.3 10/30/2023   MCV 88.2 10/30/2023   PLT 251 10/30/2023   Recent Labs    09/14/23 0749 10/12/23 0927 10/30/23 1756  NA 140 139 134*  K 4.6 3.7 4.4  CL 107 104 103  CO2 25 29 24   GLUCOSE 174* 284* 167*  BUN 16 21 19   CREATININE 1.04 1.04 0.85  CALCIUM 9.7 9.6 9.7  GFRNONAA >60 >60 >60  PROT 7.3 6.8 7.5  ALBUMIN 4.6 4.3 4.2  AST 13* 13* 40  ALT 11 13 35  ALKPHOS 59 48 54  BILITOT 0.7 0.7 1.1    RADIOGRAPHIC STUDIES: I have personally reviewed the radiological images as listed and agreed with the findings in the report. MR BRAIN WO CONTRAST Result Date: 10/31/2023 CLINICAL DATA:  Altered mental status. EXAM: MRI HEAD WITHOUT CONTRAST TECHNIQUE: Multiplanar, multiecho pulse sequences of the brain and surrounding structures were obtained without intravenous contrast. COMPARISON:  CT of the head dated October 30, 2023 and MRI brain dated Oct 05, 2023. FINDINGS: Brain: The patient is status post right posterior craniotomy for resection of lesion present posterior medially within the right parietal lobe. There is a complex cystic surgical resection cavity again demonstrated measuring approximately 3.2 cm in AP diameter, 2.5 cm in transverse diameter and 3.3 cm in craniocaudad length. There is persistent residual elevated T2 signal within the surrounding white matter, which appears similar to the prior MRI. The current study was performed without the benefit of intravenous contrast. A focal area of increased T2 signal is also again demonstrated within the right frontal lobe, which appears unchanged. There is moderate generalized cerebral volume loss and mild to moderate periventricular white matter disease. A meningioma of the planum sphenoidale is also again demonstrated, measuring approximately 2.3 x 2.0 x 0.9 cm. Vascular: Normal  vascular flow voids. Skull and upper cervical spine: Status post right posterior craniotomy. Normal marrow signal otherwise. Sinuses/Orbits: Opacification within the right maxillary sinus. The orbits are unremarkable. Other: None. IMPRESSION: 1. Status post right posterior craniotomy for resection of a lesion within the right posteromedial parietal lobe. There is no significant interval change in the appearance of the surgical resection cavity on this noncontrast MRI of the brain. 2. Stable lesion along the planum sphenoidale, compatible with meningioma. Electronically Signed   By: Evalene Coho M.D.   On: 10/31/2023 13:02   CT Head Wo Contrast Result Date: 10/30/2023 CLINICAL DATA:  Initial evaluation for acute delirium. EXAM: CT HEAD WITHOUT CONTRAST TECHNIQUE: Contiguous axial images were obtained from the base of the skull through the vertex without intravenous contrast. RADIATION DOSE REDUCTION: This exam was performed according to the departmental dose-optimization program which includes automated exposure control, adjustment of the mA and/or kV according to patient size and/or use of iterative reconstruction technique. COMPARISON:  Prior MRI from 10/16/2023 FINDINGS: Brain: Postoperative changes from prior right posterior craniotomy. Heterogeneous hypodensity with cystic change within the underlying right parieto-occipital region, consistent with known history of GBM. Irregular involvement of the anterior right frontal cortex, better appreciated on prior brain MRI. 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 No acute intracranial hemorrhage. No acute large vessel territory infarct. No other visible mass lesion. No significant mass effect or midline shift. Stable ventricular size without hydrocephalus. No extra-axial fluid collection. Vascular: No abnormal hyperdense vessel. Skull: Scalp soft tissues demonstrate no acute finding. Prior right posterior craniotomy.  Calvarium otherwise intact. Sinuses/Orbits: Globes and orbital soft tissues within normal limits. Paranasal sinuses are largely clear. No significant mastoid effusion. Other: None. IMPRESSION: 1. No acute intracranial abnormality. 2. Postoperative changes from prior right posterior craniotomy with underlying right parieto-occipital GBM, better appreciated on prior brain MRI. No significant mass effect or midline shift. 3. 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 Electronically Signed   By: Morene Hoard M.D.   On: 10/30/2023 20:56   DG Chest Portable 1 View Result Date: 10/30/2023 CLINICAL DATA:  Altered mental status. EXAM: PORTABLE CHEST 1 VIEW COMPARISON:  None Available. FINDINGS: The  heart size and mediastinal contours are within normal limits. Low lung volumes are noted. Both lungs are clear. The visualized skeletal structures are unremarkable. IMPRESSION: Low lung volumes without active cardiopulmonary disease. Electronically Signed   By: Suzen Dials M.D.   On: 10/30/2023 18:53    ASSESSMENT & PLAN:  Glioblastoma Cognitive Impairment  BROGHAN PANNONE presents with cognitive and motor dysfunction.  Etiology is favored to be CNS inflammation following radiation, likely refractory to corticosteroids.  This is based on clinical findings, timing since completing RT, and most recent MRI results (though without contrast).  Also possible are recurrence of tumor, radiation leukomalacia, though scan results are less supportive of these.  We did discuss and recommend proceeding with trial of avastin 10mg /kg IV q2 weeks.  Avastin will be helpful in treatment of inflammation, as steroid sparing agent, and possibly as disease modifying drug.  We reviewed potential side effects, including hypertension, bleeding/clotting events, and wound healing impairment.  The patient will have a complete blood count, a comprehensive metabolic panel, and urine protein performed  prior to each avastin infusion. Labs may need to be performed more often.   Avastin should be held for the following:  ANC less than 500  Platelets less than 50,000  LFT or creatinine greater than 2x ULN  If clinical concerns/contraindications develop  Avastin is typically given from outpatient setting.  It might be possible to infuse during this admission if 6E team is amenable and bed is available.  Otherwise, a safe disposition will be needed so that transportation can be arranged to outpatient infusion.  We do not feel he is safe in his current condition to discharge to home based on significant deficits and ADL needs.  We will place treatment plan in chart and plan trial of avastin either inpatient or outpatient.  In meantime can continue higher dose decadron  and seroquel for sleep, nocturnal agitation.  The total time spent in the encounter was 60 minutes and more than 50% was on counseling and review of test results     Arthea MARLA Manns, MD 10/31/2023 4:44 PM

## 2023-11-01 DIAGNOSIS — G9340 Encephalopathy, unspecified: Secondary | ICD-10-CM | POA: Diagnosis not present

## 2023-11-01 LAB — GLUCOSE, CAPILLARY
Glucose-Capillary: 413 mg/dL — ABNORMAL HIGH (ref 70–99)
Glucose-Capillary: 326 mg/dL — ABNORMAL HIGH (ref 70–99)

## 2023-11-01 MED ORDER — BACLOFEN 10 MG PO TABS: 5.0000 mg | ORAL_TABLET | Freq: Four times a day (QID) | ORAL | Status: DC | PRN

## 2023-11-01 MED ORDER — INSULIN ASPART 100 UNIT/ML IJ SOLN
0.0000 [IU] | Freq: Three times a day (TID) | INTRAMUSCULAR | Status: DC
  Administered 2023-11-01: 9 [IU] via SUBCUTANEOUS

## 2023-11-01 MED ORDER — CLOPIDOGREL BISULFATE 75 MG PO TABS
75.0000 mg | ORAL_TABLET | Freq: Every day | ORAL | Status: DC
  Administered 2023-11-01 – 2023-11-05 (×5): 75 mg via ORAL
  Filled 2023-11-01 (×5): qty 1

## 2023-11-01 MED ORDER — INSULIN ASPART 100 UNIT/ML IJ SOLN
0.0000 [IU] | Freq: Every day | INTRAMUSCULAR | Status: DC
  Administered 2023-11-01: 4 [IU] via SUBCUTANEOUS

## 2023-11-01 NOTE — Evaluation (Signed)
 Physical Therapy Evaluation Patient Details Name: Javier Graham MRN: [id number] DOB: 1955-06-07 Today's Date: 11/01/2023  History of Present Illness  68 y.o. male admitted through ED via EMS d/t altered mental status of unknown etiology.  Concern this may be related to patient's history of glioblastoma.  Admitted to [facility] with acute encephalopathy.  MRI brain ordered.  PMH: glioblastoma s/p resection, diabetes mellitus type 2, hypertension, insomnia.  Clinical Impression  Pt admitted with above diagnosis.  PTA, patient was living with wife at home, amb with rollator and for past few weeks has had a significant decline in all aspects of ADLs and mobility  sustaining several episodes of sliding to floor and difficulty with receiving assist from hired PCA and wife due to complexity of deficits.  Pt requires +2 max/total assist for bed mobility and attempted STS transfers today. Pt is oriented to self, does not recognize his wife.  Physical deficits compounded by delayed processing, poor motor planning,  L hemibody diminished motor control & poor awareness/inattention to L, decr proprioception and decr coordination Patient will benefit from continued inpatient follow up therapy, <3 hours/day   Pt currently with functional limitations due to the deficits listed below (see PT Problem List). Pt will benefit from acute skilled PT to increase their independence and safety with mobility to allow discharge.           If plan is discharge home, recommend the following: Two people to help with walking and/or transfers;Two people to help with bathing/dressing/bathroom;Assist for transportation;Help with stairs or ramp for entrance;Direct supervision/assist for financial management;Assistance with feeding;Assistance with cooking/housework;Direct supervision/assist for medications management;Supervision due to cognitive status   Can travel by private vehicle   No    Equipment Recommendations Other (comment)  (TBD)  Recommendations for Other Services       Functional Status Assessment Patient has had a recent decline in their functional status and demonstrates the ability to make significant improvements in function in a reasonable and predictable amount of time.     Precautions / Restrictions Precautions Precautions: Fall Recall of Precautions/Restrictions: Impaired Restrictions Weight Bearing Restrictions Per Provider Order: No      Mobility  Bed Mobility Overal bed mobility: Needs Assistance Bed Mobility: Rolling, Supine to Sit, Sit to Supine Rolling: Max assist, +2 for physical assistance, +2 for safety/equipment, Used rails   Supine to sit: Max assist, +2 for physical assistance, +2 for safety/equipment, HOB elevated, Used rails Sit to supine: Max assist, +2 for physical assistance, +2 for safety/equipment, HOB elevated, Used rails   General bed mobility comments: max tactile and verbal cues for all aspects of hand placement and body alignment and motor planning; hand over hand for pt to self assist    Transfers Overall transfer level: Needs assistance Equipment used: Rolling walker (2 wheels) Transfers: Sit to/from Stand Sit to Stand: Max assist, +2 physical assistance, +2 safety/equipment, From elevated surface           General transfer comment: max, multi-modal and hand over hand  cues for hand placement. heavy +2 assist to stand with significant posterior/right lean. unable to come to full stand but was able to transfer wt through LEs    Ambulation/Gait               General Gait Details: unable  Stairs            Wheelchair Mobility     Tilt Bed    Modified Rankin (Stroke Patients Only)  Balance Overall balance assessment: Needs assistance Sitting-balance support: Feet supported, Single extremity supported Sitting balance-Leahy Scale: Zero Sitting balance - Comments: significant R and posterior lean Postural control: Posterior lean,  Right lateral lean   Standing balance-Leahy Scale: Zero Standing balance comment: + 2 due to decr motor planning and poor proprioception, pt unaware of body position in space, unable to come to full stand                             Pertinent Vitals/Pain Pain Assessment Pain Assessment: Faces Faces Pain Scale: Hurts a little bit Pain Descriptors / Indicators: Grimacing, Guarding, Tender, Other (Comment) (startles) Pain Intervention(s): Limited activity within patient's tolerance, Monitored during session, Repositioned, Relaxation    Home Living Family/patient expects to be discharged to:: Private residence Living Arrangements: Spouse/significant other Available Help at Discharge: Family Type of Home: House Home Access: Stairs to enter   Secretary/administrator of Steps: 1 Alternate Level Stairs-Number of Steps: FF but 1st floor set up Home Layout: Multi-level Home Equipment: Shower seat - built in;Rollator (4 wheels)      Prior Function Prior Level of Function : Needs assist  Cognitive Assist : Mobility (cognitive);ADLs (cognitive) Mobility (Cognitive): Set up cues ADLs (Cognitive): Step by step cues Physical Assist : Mobility (physical) Mobility (physical): Bed mobility;Transfers;Gait;Stairs ADLs (physical): Feeding;Grooming;Bathing;Dressing;Toileting Mobility Comments: decline in past week where patient was unable to mobilize with rollator and slid down to floor multiple times ADLs Comments: wife had hired assist due to significant decline in all ADL's including incontinence but aide only came 1x and patient was agitated; wife assisted with all ADL's (the past few weeks) and IADL's     Extremity/Trunk Assessment   Upper Extremity Assessment Upper Extremity Assessment: Defer to OT evaluation;Difficult to assess due to impaired cognition RUE Deficits / Details: rigidity RUE Coordination: decreased fine motor;decreased gross motor LUE:  (poor awareness of L  hemibody) LUE Coordination: decreased fine motor;decreased gross motor    Lower Extremity Assessment Lower Extremity Assessment: RLE deficits/detail;Difficult to assess due to impaired cognition;LLE deficits/detail RLE Deficits / Details: AAROM grossly WFL, grossly 2+/5 RLE Sensation: decreased light touch LLE Deficits / Details: rigidity throughout, resistant to imposed movement LLE Sensation: decreased proprioception;decreased light touch LLE Coordination: decreased fine motor;decreased gross motor    Cervical / Trunk Assessment Cervical / Trunk Assessment: Normal  Communication   Communication Communication: Impaired Factors Affecting Communication: Difficulty expressing self;Reduced clarity of speech    Cognition Arousal: Alert Behavior During Therapy: Anxious, Agitated, Restless, Impulsive   PT - Cognitive impairments: Orientation, Memory, Attention, Initiation, Sequencing, Problem solving, Safety/Judgement, Awareness                       PT - Cognition Comments: oriented only to self; internally distaracted--picking at sheets, gait belt, etc; perseverative Following commands: Impaired Following commands impaired: Follows one step commands inconsistently     Cueing Cueing Techniques: Verbal cues, Gestural cues, Tactile cues, Visual cues     General Comments General comments (skin integrity, edema, etc.): inattention to L    Exercises     Assessment/Plan    PT Assessment Patient needs continued PT services  PT Problem List Decreased activity tolerance;Decreased balance;Decreased knowledge of use of DME;Impaired sensation;Decreased cognition;Decreased knowledge of precautions;Decreased coordination;Decreased mobility;Decreased safety awareness       PT Treatment Interventions DME instruction;Therapeutic exercise;Functional mobility training;Therapeutic activities;Patient/family education;Neuromuscular re-education;Cognitive remediation    PT Goals  (Current goals can  be found in the Care Plan section)  Acute Rehab PT Goals PT Goal Formulation: With family Time For Goal Achievement: 11/15/23 Potential to Achieve Goals: Fair    Frequency Min 2X/week     Co-evaluation PT/OT/SLP Co-Evaluation/Treatment: Yes Reason for Co-Treatment: Complexity of the patient's impairments (multi-system involvement);Necessary to address cognition/behavior during functional activity;For patient/therapist safety PT goals addressed during session: Mobility/safety with mobility;Balance;Proper use of DME OT goals addressed during session: ADL's and self-care;Proper use of Adaptive equipment and DME       AM-PAC PT 6 Clicks Mobility  Outcome Measure Help needed turning from your back to your side while in a flat bed without using bedrails?: Total Help needed moving from lying on your back to sitting on the side of a flat bed without using bedrails?: Total Help needed moving to and from a bed to a chair (including a wheelchair)?: Total Help needed standing up from a chair using your arms (e.g., wheelchair or bedside chair)?: Total Help needed to walk in hospital room?: Total Help needed climbing 3-5 steps with a railing? : Total 6 Click Score: 6    End of Session Equipment Utilized During Treatment: Gait belt Activity Tolerance: Other (comment);Patient tolerated treatment well (cognition, decr motor planning) Patient left: in bed;with call bell/phone within reach;with family/visitor present;with bed alarm set   PT Visit Diagnosis: Other abnormalities of gait and mobility (R26.89);Muscle weakness (generalized) (M62.81);History of falling (Z91.81)    Time: 8592-8565 PT Time Calculation (min) (ACUTE ONLY): 27 min   Charges:   PT Evaluation $PT Eval Moderate Complexity: 1 Mod   PT General Charges $$ ACUTE PT VISIT: 1 Visit         Keniyah Gelinas, PT  Acute Rehab Dept [facility]) [phone]  11/01/2023   [facility] 11/01/2023, 3:52 PM

## 2023-11-01 NOTE — Evaluation (Signed)
 Occupational Therapy Evaluation Patient Details Name: Javier Graham MRN: [id number] DOB: [birth year] Today's Date: 11/01/2023   History of Present Illness   Javier Graham is a 68 y.o. male with a history of glioblastoma s/p resection, diabetes mellitus type 2, hypertension, insomnia.  Patient presented secondary to concern of altered mental status of unknown etiology.  Concerned this may be related to patient's history of glioblastoma.  Admitted to [facility] with acute encephalopathy. Decadron  IV started.  MRI brain ordered.     Clinical Impressions PTA, patient was living with wife at home and for past few weeks has had a significant decline in all aspects of ADL and mobility with rollator sustaining several episodes of sliding to floor and difficulty with receiving assist from hired PCA and wife due to complexity of deficits. Currently, patient presents with deficits outlined below (see OT Problem List for details) most significantly L hemibody motor control, awareness, perception and coordination deficits, decreased cognition/behavioral skills, balance and activity tolerance limiting BADL's and functional mobility. Patient requires continued Acute level OT services to progress safety and function and allow for discharge. Patient will benefit from continued inpatient follow up therapy, <3 hours/day.       If plan is discharge home, recommend the following:   Two people to help with walking and/or transfers;Two people to help with bathing/dressing/bathroom;Assistance with cooking/housework;Assistance with feeding;Direct supervision/assist for medications management;Direct supervision/assist for financial management;Assist for transportation;Help with stairs or ramp for entrance;Supervision due to cognitive status     Functional Status Assessment   Patient has had a recent decline in their functional status and demonstrates the ability to make significant improvements in function in a reasonable  and predictable amount of time.     Equipment Recommendations   Other (comment) (TBD post rehab)      Precautions/Restrictions   Precautions Precautions: Fall Recall of Precautions/Restrictions: Impaired Restrictions Weight Bearing Restrictions Per Provider Order: No     Mobility Bed Mobility Overal bed mobility: Needs Assistance Bed Mobility: Rolling, Supine to Sit, Sit to Supine Rolling: Max assist, +2 for physical assistance, +2 for safety/equipment, Used rails   Supine to sit: Max assist, +2 for physical assistance, +2 for safety/equipment, HOB elevated, Used rails Sit to supine: Max assist, +2 for physical assistance, +2 for safety/equipment, HOB elevated, Used rails   General bed mobility comments: max tactile and verbal cues for all aspects of hand placement and body alighment and motor planning    Transfers Overall transfer level: Needs assistance Equipment used: Rolling walker (2 wheels) Transfers: Sit to/from Stand Sit to Stand: Max assist, +2 physical assistance, +2 safety/equipment, From elevated surface           General transfer comment: max cues for hand placement and reliant on device with significant posteriorly right lean      Balance Overall balance assessment: Needs assistance Sitting-balance support: Feet supported, Single extremity supported Sitting balance-Leahy Scale: Poor Sitting balance - Comments: significant R and posterior lean Postural control: Posterior lean, Right lateral lean Standing balance support: Reliant on assistive device for balance, Bilateral upper extremity supported Standing balance-Leahy Scale: Poor Standing balance comment: unable to relase gait belt with + 2 due to motor planning and poor position in space                           ADL either performed or assessed with clinical judgement   ADL Overall ADL's : Needs assistance/impaired Eating/Feeding: Maximal assistance;Cueing for sequencing;Bed  level   Grooming: Oral care;Wash/dry hands;Wash/dry face;Sitting;Maximal assistance (EOB) Grooming Details (indicate cue type and reason): verbal cues and R sided assist primarily Upper Body Bathing: Maximal assistance;Bed level;Cueing for sequencing   Lower Body Bathing: Total assistance;Bed level   Upper Body Dressing : Maximal assistance;Bed level   Lower Body Dressing: Total assistance;Bed level       Toileting- Clothing Manipulation and Hygiene: Total assistance;Bed level       Functional mobility during ADLs: Maximal assistance;+2 for physical assistance;+2 for safety/equipment (EOB/STS x 1 trial) General ADL Comments: max cues, poor L sided hemibody integration and perception     Vision Ability to See in Adequate Light: 1 Impaired Patient Visual Report: Other (comment) (L visual inattention) Vision Assessment?: Vision impaired- to be further tested in functional context Additional Comments: R gaze preference     Perception Perception: Impaired Preception Impairment Details: Inattention/Neglect, Body Scheme, Body Part identification, Spatial orientation     Praxis Praxis: Impaired Praxis Impairment Details: Initiation, Ideation, Ideomotor, Perseveration, Motor planning, Organization     Pertinent Vitals/Pain Pain Assessment Pain Assessment: Faces Faces Pain Scale: Hurts a little bit Breathing: normal Negative Vocalization: occasional moan/groan, low speech, negative/disapproving quality Facial Expression: facial grimacing Body Language: tense, distressed pacing, fidgeting Consolability: no need to console PAINAD Score: 4 Pain Location: generalized with touch/startles Pain Descriptors / Indicators: Grimacing, Guarding, Tender, Other (Comment) (startles) Pain Intervention(s): Limited activity within patient's tolerance, Repositioned, Relaxation     Extremity/Trunk Assessment Upper Extremity Assessment Upper Extremity Assessment: Right hand dominant;RUE  deficits/detail;LUE deficits/detail RUE Deficits / Details: rigidity RUE Coordination: decreased fine motor;decreased gross motor LUE:  (poor awareness of L hemibody) LUE Coordination: decreased fine motor;decreased gross motor   Lower Extremity Assessment Lower Extremity Assessment: Defer to PT evaluation   Cervical / Trunk Assessment Cervical / Trunk Assessment: Normal   Communication Communication Communication: Impaired Factors Affecting Communication: Difficulty expressing self;Reduced clarity of speech   Cognition Arousal: Alert Behavior During Therapy: Anxious, Agitated, Restless, Impulsive Cognition: Cognition impaired   Orientation impairments: Situation, Place, Time Awareness: Intellectual awareness impaired, Online awareness impaired Memory impairment (select all impairments): Short-term memory, Working memory, Non-declarative long-term memory Attention impairment (select first level of impairment): Focused attention Executive functioning impairment (select all impairments): Initiation, Organization, Sequencing, Reasoning, Problem solving OT - Cognition Comments: perseverative, needs step by step clear consise 1 step directions                 Following commands: Impaired Following commands impaired: Follows one step commands inconsistently     Cueing  General Comments   Cueing Techniques: Verbal cues;Gestural cues;Tactile cues;Visual cues  no SOB, no skin issues noted           Home Living Family/patient expects to be discharged to:: Private residence Living Arrangements: Spouse/significant other   Type of Home: House Home Access: Stairs to enter Secretary/administrator of Steps: 1   Home Layout: Multi-level Alternate Level Stairs-Number of Steps: FF but 1st floor set up   [employer] Shower/Tub: Producer, television/film/video: Standard Bathroom Accessibility: Yes How Accessible: Accessible via walker Home Equipment: Shower seat - built  in;Rollator (4 wheels)          Prior Functioning/Environment Prior Level of Function : Needs assist  Cognitive Assist : Mobility (cognitive);ADLs (cognitive) Mobility (Cognitive): Step by step cues ADLs (Cognitive): Step by step cues Physical Assist : Mobility (physical);ADLs (physical) Mobility (physical): Bed mobility;Transfers;Gait;Stairs ADLs (physical): Feeding;Grooming;Bathing;Dressing;Toileting Mobility Comments: decline in past week where patient was unable  to mobilize with rollator and slid down to floor multiple times ADLs Comments: wife had hired assist due to significant decline in all ADL's including incontinence but aide only came 1x and patient was agitated; wife assisted with all ADL's (the past few weeks) and IADL's    OT Problem List: Decreased activity tolerance;Impaired balance (sitting and/or standing);Impaired vision/perception;Decreased coordination;Decreased cognition;Decreased safety awareness;Decreased knowledge of use of DME or AE;Decreased knowledge of precautions;Impaired UE functional use   OT Treatment/Interventions: Self-care/ADL training;Therapeutic exercise;Neuromuscular education;DME and/or AE instruction;Therapeutic activities;Cognitive remediation/compensation;Visual/perceptual remediation/compensation;Patient/family education;Balance training      OT Goals(Current goals can be found in the care plan section)   Acute Rehab OT Goals Patient Stated Goal: wife- to be safe OT Goal Formulation: With family Time For Goal Achievement: 11/15/23 Potential to Achieve Goals: Fair ADL Goals Pt Will Perform Eating: with min assist;sitting Pt Will Perform Grooming: sitting;with supervision Pt Will Perform Upper Body Bathing: with min assist;sitting Pt Will Perform Upper Body Dressing: with mod assist;sitting Pt Will Transfer to Toilet: with +2 assist;stand pivot transfer;bedside commode Additional ADL Goal #1: Patient will scan to L side and use L UE as  stabilizer assist 50% of time with mod cues   OT Frequency:  Min 2X/week    Co-evaluation PT/OT/SLP Co-Evaluation/Treatment: Yes Reason for Co-Treatment: Complexity of the patient's impairments (multi-system involvement);Necessary to address cognition/behavior during functional activity;For patient/therapist safety PT goals addressed during session: Mobility/safety with mobility;Balance;Proper use of DME OT goals addressed during session: ADL's and self-care;Proper use of Adaptive equipment and DME      AM-PAC OT 6 Clicks Daily Activity     Outcome Measure Help from another person eating meals?: A Lot Help from another person taking care of personal grooming?: A Lot Help from another person toileting, which includes using toliet, bedpan, or urinal?: Total Help from another person bathing (including washing, rinsing, drying)?: A Lot Help from another person to put on and taking off regular upper body clothing?: A Lot Help from another person to put on and taking off regular lower body clothing?: Total 6 Click Score: 10   End of Session Equipment Utilized During Treatment: Gait belt;Rolling walker (2 wheels) Nurse Communication: Mobility status;Need for lift equipment  Activity Tolerance: Other (comment) (alolowed rest bed level following session for postural alignment) Patient left: in bed;with bed alarm set;with family/visitor present;with call bell/phone within reach  OT Visit Diagnosis: Unsteadiness on feet (R26.81);Other abnormalities of gait and mobility (R26.89);History of falling (Z91.81);Muscle weakness (generalized) (M62.81);Apraxia (R48.2);Feeding difficulties (R63.3);Other symptoms and signs involving cognitive function;Cognitive communication deficit (R41.841);Hemiplegia and hemiparesis Symptoms and signs involving cognitive functions: Other cerebrovascular disease Hemiplegia - Right/Left: Left Hemiplegia - dominant/non-dominant: Non-Dominant Hemiplegia - caused by: Other  cerebrovascular disease (GB[facility])                Time: 8592-8564 OT Time Calculation (min): 28 min Charges:  OT General Charges $OT Visit: 1 Visit OT Evaluation $OT Eval Moderate Complexity: 1 Mod OT Treatments $Self Care/Home Management : 8-22 mins  Javier Graham OT/L Acute Rehabilitation Department  [phone]  11/01/2023, 3:38 PM

## 2023-11-01 NOTE — Progress Notes (Signed)
 [facility] PROGRESS NOTE  [name] (DOB: [birth year]) FMW:[id number] PCP: Lari Elspeth BRAVO, MD  Brief Narrative: Javier Graham is a 68 y.o. male with a history of glioblastoma s/p resection, radiation, and temozolomide , T2DM, HTN, insomnia who presented to the ED on 10/30/2023 with cognitive and motor dysfunction favored to be due to CNS inflammation following radiation. Neurooncology has made recommendations for high dose steroids and initiation of avastin. PT/OT confirm need for SNF which is being pursued. If unable to get this accomplished expeditiously, would initiate chemo as inpatient.   Subjective: Pt quite confused, no complaints  Objective: BP (!) 156/86 (BP Location: Left Arm)   Pulse 98   Temp 97.7 F (36.5 C) (Oral)   Resp 16   Ht 6' 1 (1.854 m)   Wt 90.7 kg   SpO2 98%   BMI 26.39 kg/m   Gen: No distress Pulm: Clear, nonlabored  CV: RRR, no MRG GI: Soft, NT, ND, +BS  Neuro: Alert and interactive, responsive but cognitively slowed with abnormal speech, disorientation, and left side neglect and modest LLE weakness. No new focal deficits. Ext: Warm, no deformities Skin: No rashes, lesions or ulcers on visualized skin   Assessment & Plan: Acute encephalopathy: Favored to represent radiation-induced inflammation after treatment for glioblastoma which has been resected.  - Continue decadron  - PT/OT recommending SNF, TOC consulted and messaged.   History of glioblastoma Patient follows with neuro-oncology.  Patient is currently on chemotherapy is s/p resection and radiation.   Diabetes mellitus type 2 Seems to be well-controlled, however last hemoglobin A1c is 7.0% from 2020.  Patient is on Farxiga , NovoLog, glimepiride  as an outpatient. - Continue SSI   Insomnia - Continue seroquel   Primary hypertension -Continue lisinopril   Microvascular left trochlea nerve palsy: Patient is managed with Plavix  as an outpatient which was held on admission.  Javier Graham Come, MD [facility] [email or web address] 11/01/2023, 4:29 PM

## 2023-11-02 DIAGNOSIS — G9340 Encephalopathy, unspecified: Secondary | ICD-10-CM | POA: Diagnosis not present

## 2023-11-02 LAB — GLUCOSE, CAPILLARY
Glucose-Capillary: 370 mg/dL — ABNORMAL HIGH (ref 70–99)
Glucose-Capillary: 337 mg/dL — ABNORMAL HIGH (ref 70–99)
Glucose-Capillary: 267 mg/dL — ABNORMAL HIGH (ref 70–99)
Glucose-Capillary: 308 mg/dL — ABNORMAL HIGH (ref 70–99)

## 2023-11-02 LAB — URINALYSIS, W/ REFLEX TO CULTURE (INFECTION SUSPECTED)
Bacteria, UA: NONE SEEN
Bilirubin Urine: NEGATIVE
Glucose, UA: >=500 mg/dL — AB
Hgb urine dipstick: NEGATIVE
Ketones, ur: 5 mg/dL — AB
Leukocytes,Ua: NEGATIVE
Nitrite: NEGATIVE
Protein, ur: NEGATIVE mg/dL
Specific Gravity, Urine: 1.03 (ref 1.005–1.030)
pH: 5 (ref 5.0–8.0)

## 2023-11-02 MED ORDER — INSULIN ASPART 100 UNIT/ML IJ SOLN
0.0000 [IU] | Freq: Every day | INTRAMUSCULAR | Status: DC
  Administered 2023-11-02 – 2023-11-03 (×2): 4 [IU] via SUBCUTANEOUS

## 2023-11-02 MED ORDER — INSULIN ASPART 100 UNIT/ML IJ SOLN
0.0000 [IU] | Freq: Three times a day (TID) | INTRAMUSCULAR | Status: DC
  Administered 2023-11-02: 15 [IU] via SUBCUTANEOUS
  Administered 2023-11-02: 11 [IU] via SUBCUTANEOUS
  Administered 2023-11-02: 8 [IU] via SUBCUTANEOUS
  Administered 2023-11-03: 15 [IU] via SUBCUTANEOUS
  Administered 2023-11-03: 11 [IU] via SUBCUTANEOUS
  Administered 2023-11-03: 15 [IU] via SUBCUTANEOUS

## 2023-11-02 NOTE — Plan of Care (Signed)
  Problem: Clinical Measurements: Goal: Diagnostic test results will improve Outcome: Progressing Goal: Respiratory complications will improve Outcome: Progressing   Problem: Activity: Goal: Risk for activity intolerance will decrease Outcome: Progressing   Problem: Pain Managment: Goal: General experience of comfort will improve and/or be controlled Outcome: Progressing   Problem: Clinical Measurements: Goal: Diagnostic test results will improve Outcome: Progressing Goal: Respiratory complications will improve Outcome: Progressing   Problem: Activity: Goal: Risk for activity intolerance will decrease Outcome: Progressing   Problem: Pain Managment: Goal: General experience of comfort will improve and/or be controlled Outcome: Progressing

## 2023-11-02 NOTE — [place] FL2 (Signed)
 [place]  MEDICAID FL2 LEVEL OF CARE FORM     IDENTIFICATION  Patient Name: Javier Graham Birthdate: [birth year] Sex: male Admission Date (Current Location): 10/30/2023  [facility] and [place] Number:  Producer, television/film/video and Address:  [facility],  [address], [place] [address]      Provider Number: [id number]  Attending Physician Name and Address:  Bryn Bernardino NOVAK, MD  Relative Name and Phone Number:  Mavric, Cortright (Spouse)  [phone]    Current Level of Care: Hospital Recommended Level of Care: Skilled Nursing Facility Prior Approval Number:    Date Approved/Denied:   PASRR Number: [id number]  Discharge Plan: SNF    Current Diagnoses: Patient Active Problem List   Diagnosis Date Noted   Encephalopathy acute 10/30/2023   Glioblastoma, IDH-wildtype (HCC) 07/27/2023   Diplopia 12/19/2018   Stenosis of right vertebral artery 12/19/2018   Diabetes (HCC) 06/19/2016   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   High ankle sprain 10/17/2012   Leg pain 10/17/2012    Orientation RESPIRATION BLADDER Height & Weight     Self  Normal Incontinent Weight: 200 lb (90.7 kg) Height:  6' 1 (185.4 cm)  BEHAVIORAL SYMPTOMS/MOOD NEUROLOGICAL BOWEL NUTRITION STATUS      Continent Diet (Regular)  AMBULATORY STATUS COMMUNICATION OF NEEDS Skin   Extensive Assist Verbally (delayed responses) Normal                       Personal Care Assistance Level of Assistance  Bathing, Feeding, Dressing Bathing Assistance: Maximum assistance Feeding assistance: Limited assistance Dressing Assistance: Maximum assistance     Functional Limitations Info  Sight, Hearing, Speech Sight Info: Impaired Hearing Info: Adequate Speech Info: Impaired (delayed responses)    SPECIAL CARE FACTORS FREQUENCY  PT (By licensed PT), OT (By licensed OT)     PT Frequency: 5x per week OT Frequency: 5x per week            Contractures Contractures Info: Not present     Additional Factors Info  Code Status, Allergies Code Status Info: FULL Allergies Info: NKA           Current Medications (11/02/2023):  This is the current hospital active medication list Current Facility-Administered Medications  Medication Dose Route Frequency Provider Last Rate Last Admin   acetaminophen  (TYLENOL ) tablet 650 mg  650 mg Oral Q6H PRN Dorrell, Robert, MD       Or   acetaminophen  (TYLENOL ) suppository 650 mg  650 mg Rectal Q6H PRN Dorrell, Robert, MD       baclofen  (LIORESAL ) tablet 5 mg  5 mg Oral Q6H PRN Bryn Bernardino NOVAK, MD       clopidogrel  (PLAVIX ) tablet 75 mg  75 mg Oral Daily Bryn Bernardino B, MD   75 mg at 11/02/23 0932   dexamethasone  (DECADRON ) injection 8 mg  8 mg Intravenous Q8H Dorrell, Robert, MD   8 mg at 11/02/23 0517   enoxaparin (LOVENOX) injection 40 mg  40 mg Subcutaneous Q24H Dena Charleston, MD   40 mg at 11/02/23 0932   insulin aspart (novoLOG) injection 0-15 Units  0-15 Units Subcutaneous TID WC Bryn Bernardino NOVAK, MD   15 Units at 11/02/23 0809   insulin aspart (novoLOG) injection 0-5 Units  0-5 Units Subcutaneous QHS Bryn Bernardino NOVAK, MD       lisinopril (ZESTRIL) tablet 5 mg  5 mg Oral Daily Dorrell, Robert, MD   5 mg at 11/02/23 0932   ondansetron  (ZOFRAN )  tablet 4 mg  4 mg Oral Q6H PRN Dena Charleston, MD       Or   ondansetron  (ZOFRAN ) injection 4 mg  4 mg Intravenous Q6H PRN Dorrell, Robert, MD       QUEtiapine (SEROQUEL) tablet 50 mg  50 mg Oral QHS Dorrell, Robert, MD   50 mg at 11/01/23 2131     Discharge Medications: Please see discharge summary for a list of discharge medications.  Relevant Imaging Results:  Relevant Lab Results:   Additional Information SSN: [id number]  Heather DELENA Saltness, LCSW

## 2023-11-02 NOTE — Inpatient Diabetes Management (Signed)
 Inpatient Diabetes Program Recommendations  AACE/ADA: New Consensus Statement on Inpatient Glycemic Control (2015)  Target Ranges:  Prepandial:   less than 140 mg/dL      Peak postprandial:   less than 180 mg/dL (1-2 hours)      Critically ill patients:  140 - 180 mg/dL   Lab Results  Component Value Date   GLUCAP 337 (H) 11/02/2023   HGBA1C 7.0 (H) 12/19/2018    Review of Glycemic Control  Latest Reference Range & Units 11/01/23 18:47 11/01/23 21:18 11/02/23 07:26 11/02/23 12:08  Glucose-Capillary 70 - 99 mg/dL 586 (H) 673 (H) 629 (H) 337 (H)  (H): Data is abnormally high Diabetes history: Type 2 DM Outpatient Diabetes medications: Victoza  1.8 mg every day, Metformin 1000 mg BID, Amaryl  4 mg every day, Humalog 0-8 units TID Current orders for Inpatient glycemic control: Novolog 0-15 units TID & HS  Inpatient Diabetes Program Recommendations:    Consider adding Semglee 12 units every day  Thanks, Tinnie Minus, MSN, RNC-OB Diabetes Coordinator [phone] (8a-5p)

## 2023-11-02 NOTE — Progress Notes (Signed)
 [facility] PROGRESS NOTE  Javier Graham (DOB: 29-Dec-1955) FMW:[id number] PCP: Lari Elspeth BRAVO, MD  Brief Narrative: Javier Graham is a 69 y.o. male with a history of glioblastoma s/p resection, radiation, and temozolomide , T2DM, HTN, insomnia who presented to the ED on 10/30/2023 with cognitive and motor dysfunction favored to be due to CNS inflammation following radiation. Neurooncology has made recommendations for high dose steroids and initiation of avastin. PT/OT confirmed need for SNF which is being pursued.  Subjective: No complaints, no changes in his confusion. No overnight events.   Objective: BP 129/86 (BP Location: Right Arm)   Pulse 90   Temp 98 F (36.7 C) (Oral)   Resp 18   Ht 6' 1 (1.854 m)   Wt 90.7 kg   SpO2 98%   BMI 26.39 kg/m   Gen: No distress Neuro: Remains alert and interactive but disorganized thinking and speech, not oriented to environment, continued mild LLE decreased strength/coordination and left neglect.  No new focal deficits. Ext: Warm, dry Skin: No open ulcer on visualized skin  Assessment & Plan: Acute encephalopathy: Favored to represent radiation-induced inflammation after treatment for glioblastoma which has been resected.  - Continue decadron  high dose 8mg  IV q8h. - PT/OT recommending SNF, TOC starting process. Medically ready for discharge currently, though TOC anticipates DC 6/28.   History of glioblastoma: Patient follows with neuro-oncology, s/p resection and radiation. - Dr. Buckley assessed patient and updated MRI, plans avastin. Discussed with him today, will arrange for this to be given as an outpatient next week.    T2DM with steroid-induced hyperglycemia: HbA1c is 7.0% from 2020.  - Hold home farxiga , [facility]. - Started CBG checks 6/25, severely hyperglycemic, so started SSI and will augment today.    Insomnia - Continue seroquel   Primary hypertension - Continue lisinopril   Microvascular left trochlea nerve palsy:  -  Continue plavix   [name], MD [facility] [email or web address] 11/02/2023, 8:57 AM

## 2023-11-02 NOTE — Plan of Care (Signed)
  Problem: Clinical Measurements: Goal: Ability to maintain clinical measurements within normal limits will improve Outcome: Progressing Goal: Diagnostic test results will improve Outcome: Progressing   Problem: Nutrition: Goal: Adequate nutrition will be maintained Outcome: Progressing   

## 2023-11-02 NOTE — TOC Progression Note (Signed)
 Transition of Care [facility]) - Progression Note    Patient Details  Name: Javier Graham MRN: [id number] Date of Birth: 12-22-1955  [facility]) CM/SW Contact  Heather DELENA Saltness, LCSW Phone Number: 11/02/2023, 10:18 AM  Clinical Narrative:    CSW spoke with pt's wife, Glenn Gullickson [phone], via phone call to discuss PT's recommendation for short-term SNF rehab upon discharge. Pt's wife is in agreement with discharge to SNF. Pt's wife advised CSW that pt will need transportation from SNF to his radiation appointments due to pt's wife inability to ambulate and drive him to appointments. CSW will send referrals to SNFs in [place] and surrounding areas. TOC will continue to follow.     Barriers to Discharge: Continued Medical Work up  Expected Discharge Plan and Services In-house Referral: NA Discharge Planning Services: NA   Living arrangements for the past 2 months: Single Family Home                 DME Arranged: N/A DME Agency: NA       HH Arranged: NA HH Agency: NA         Social Determinants of Health (SDOH) Interventions SDOH Screenings   Food Insecurity: Patient Unable To Answer (10/31/2023)  Housing: Unknown (10/31/2023)  Transportation Needs: Patient Unable To Answer (10/31/2023)  Utilities: Patient Unable To Answer (10/31/2023)  Financial Resource Strain: High Risk (07/10/2023)   Received from [facility]  Social Connections: Unknown (10/31/2023)  Tobacco Use: Low Risk  (10/30/2023)    Readmission Risk Interventions    10/31/2023    8:49 AM  Readmission Risk Prevention Plan  Post Dischage Appt Complete  Medication Screening Complete  Transportation Screening Complete    Heather Saltness, MSW, LCSW 11/02/2023 10:23 AM

## 2023-11-03 ENCOUNTER — Encounter: Payer: Self-pay | Admitting: Internal Medicine

## 2023-11-03 DIAGNOSIS — E1165 Type 2 diabetes mellitus with hyperglycemia: Secondary | ICD-10-CM | POA: Insufficient documentation

## 2023-11-03 DIAGNOSIS — I10 Essential (primary) hypertension: Secondary | ICD-10-CM | POA: Diagnosis not present

## 2023-11-03 DIAGNOSIS — C719 Malignant neoplasm of brain, unspecified: Secondary | ICD-10-CM | POA: Diagnosis not present

## 2023-11-03 DIAGNOSIS — G9341 Metabolic encephalopathy: Secondary | ICD-10-CM | POA: Insufficient documentation

## 2023-11-03 DIAGNOSIS — G9340 Encephalopathy, unspecified: Secondary | ICD-10-CM | POA: Diagnosis not present

## 2023-11-03 DIAGNOSIS — G9389 Other specified disorders of brain: Secondary | ICD-10-CM | POA: Diagnosis not present

## 2023-11-03 DIAGNOSIS — H4912 Fourth [trochlear] nerve palsy, left eye: Secondary | ICD-10-CM | POA: Insufficient documentation

## 2023-11-03 LAB — GLUCOSE, CAPILLARY
Glucose-Capillary: 333 mg/dL — ABNORMAL HIGH (ref 70–99)
Glucose-Capillary: 403 mg/dL — ABNORMAL HIGH (ref 70–99)
Glucose-Capillary: 384 mg/dL — ABNORMAL HIGH (ref 70–99)
Glucose-Capillary: 346 mg/dL — ABNORMAL HIGH (ref 70–99)

## 2023-11-03 MED ORDER — INSULIN ASPART 100 UNIT/ML IJ SOLN
3.0000 [IU] | Freq: Three times a day (TID) | INTRAMUSCULAR | Status: DC
  Administered 2023-11-03 – 2023-11-04 (×4): 3 [IU] via SUBCUTANEOUS

## 2023-11-03 MED ORDER — INSULIN ASPART 100 UNIT/ML IJ SOLN
0.0000 [IU] | Freq: Three times a day (TID) | INTRAMUSCULAR | Status: DC
  Administered 2023-11-04: 11 [IU] via SUBCUTANEOUS
  Administered 2023-11-04: 8 [IU] via SUBCUTANEOUS
  Administered 2023-11-04: 11 [IU] via SUBCUTANEOUS
  Administered 2023-11-04: 8 [IU] via SUBCUTANEOUS
  Administered 2023-11-05: 15 [IU] via SUBCUTANEOUS
  Administered 2023-11-05: 8 [IU] via SUBCUTANEOUS
  Administered 2023-11-05: 15 [IU] via SUBCUTANEOUS

## 2023-11-03 MED ORDER — PANTOPRAZOLE SODIUM 40 MG PO TBEC
40.0000 mg | DELAYED_RELEASE_TABLET | Freq: Every day | ORAL | Status: DC
  Administered 2023-11-03 – 2023-11-05 (×3): 40 mg via ORAL
  Filled 2023-11-03 (×3): qty 1

## 2023-11-03 MED ORDER — INSULIN ASPART 100 UNIT/ML IJ SOLN: 2.0000 [IU] | Freq: Once | INTRAMUSCULAR | Status: DC

## 2023-11-03 MED ORDER — BACLOFEN 10 MG PO TABS
5.0000 mg | ORAL_TABLET | Freq: Four times a day (QID) | ORAL | Status: DC | PRN
  Administered 2023-11-03: 5 mg via ORAL
  Filled 2023-11-03 (×2): qty 1

## 2023-11-03 MED ORDER — INSULIN GLARGINE-YFGN 100 UNIT/ML ~~LOC~~ SOLN
10.0000 [IU] | Freq: Every day | SUBCUTANEOUS | Status: DC
  Administered 2023-11-03 – 2023-11-04 (×2): 10 [IU] via SUBCUTANEOUS
  Filled 2023-11-03 (×2): qty 0.1

## 2023-11-03 NOTE — Assessment & Plan Note (Signed)
 Severe hyperglycemia secondary to steroid use Basal bolus insulin  regimen initiated Obtaining hemoglobin A1c

## 2023-11-03 NOTE — Assessment & Plan Note (Signed)
 Follows with Dr. Buckley

## 2023-11-03 NOTE — Progress Notes (Signed)
 PROGRESS NOTE   Javier Graham  FMW:[id number] DOB: 12/13/1955 DOA: 10/30/2023 PCP: Lari Elspeth BRAVO, MD   Date of Service: the patient was seen and examined on 11/03/2023  Brief Narrative:  Javier Graham is a 68 y.o. male with a history of glioblastoma s/p resection, diabetes mellitus type 2, hypertension, insomnia.  Patient presented secondary to concern of altered mental status of unknown etiology.  Concerned this may be related to patient's history of glioblastoma.  Decadron  IV started.  MRI brain ordered.   Assessment & Plan Radiation encephalopathy Persisting symptoms of lethargy, confusion and weakness Continuing dexamethasone  MRI performed 6/24 unremarkable Obtaining remainder of encephalopathy workup including urinalysis, vitamin B12, TSH, folate, ammonia, VBG PT evaluated the patient and is recommending skilled physical therapy in a skilled nursing facility. Glioblastoma, IDH-wildtype (HCC) Follows with Dr. Buckley Uncontrolled type 2 diabetes mellitus with hyperglycemia, without long-term current use of insulin  (HCC) Severe hyperglycemia secondary to steroid use Basal bolus insulin  regimen initiated Obtaining hemoglobin A1c Essential hypertension Continue lisinopril  Left trochlear nerve palsy Diagnosed by Dr. Matthews in 2020 Patient was to take a 1 year course of Plavix  however patient appears to still be on this medication. Will continue for now, follow-up as an outpatient.    Subjective:  Patient unable to answer questions appropriately due to significant lethargy and confusion.  Physical Exam:  Vitals:   11/02/23 2131 11/03/23 0638 11/03/23 1400 11/03/23 2116  BP: (!) 141/94 (!) 136/90 (!) 169/91 (!) 140/90  Pulse: (!) 101 94 (!) 109   Resp: 16 20 18 18   Temp: 98 F (36.7 C) 97.7 F (36.5 C) 98.4 F (36.9 C) 97.9 F (36.6 C)  TempSrc:  Oral Oral Oral  SpO2: 97% 100% 100% 99%  Weight:      Height:         Constitutional: Lethargic but arousable,  oriented x 1, not in distress, intermittently following commands Skin: no rashes, no lesions, good skin turgor noted. Eyes: Pupils are equally reactive to light.  No evidence of scleral icterus or conjunctival pallor.  ENMT: Moist mucous membranes noted.   Respiratory: clear to auscultation bilaterally, no wheezing, no crackles. Normal respiratory effort. No accessory muscle use.  Cardiovascular: Regular rate and rhythm, no murmurs / rubs / gallops. No extremity edema. 2+ pedal pulses. No carotid bruits.  Abdomen: Abdomen is soft and nontender.  No evidence of intra-abdominal masses.  Positive bowel sounds noted in all quadrants.   Musculoskeletal: No joint deformity upper and lower extremities. Good ROM, no contractures. Normal muscle tone.    Data Reviewed:  I have personally reviewed and interpreted labs, imaging.  Significant findings are   CBC: Recent Labs  Lab 10/30/23 1756  WBC 11.2*  NEUTROABS 9.2*  HGB 15.6  HCT 44.3  MCV 88.2  PLT 251   Basic Metabolic Panel: Recent Labs  Lab 10/30/23 1756  NA 134*  K 4.4  CL 103  CO2 24  GLUCOSE 167*  BUN 19  CREATININE 0.85  CALCIUM 9.7   GFR: Estimated Creatinine Clearance: 95.3 mL/min (by C-G formula based on SCr of 0.85 mg/dL). Liver Function Tests: Recent Labs  Lab 10/30/23 1756  AST 40  ALT 35  ALKPHOS 54  BILITOT 1.1  PROT 7.5  ALBUMIN 4.2    Coagulation Profile: Recent Labs  Lab 10/30/23 1756  INR 1.0    Code Status:  Full code.      Severity of Illness:  The appropriate patient status for this patient is INPATIENT.  Inpatient status is judged to be reasonable and necessary in order to provide the required intensity of service to ensure the patient's safety. The patient's presenting symptoms, physical exam findings, and initial radiographic and laboratory data in the context of their chronic comorbidities is felt to place them at high risk for further clinical deterioration. Furthermore, it is not  anticipated that the patient will be medically stable for discharge from the hospital within 2 midnights of admission.   * I certify that at the point of admission it is my clinical judgment that the patient will require inpatient hospital care spanning beyond 2 midnights from the point of admission due to high intensity of service, high risk for further deterioration and high frequency of surveillance required.*  Time spent:  50 minutes  Author:  Zachary JINNY Ba MD  11/03/2023 10:38 PM

## 2023-11-03 NOTE — Assessment & Plan Note (Signed)
 Continue lisinopril

## 2023-11-03 NOTE — Progress Notes (Addendum)
 Physical Therapy Treatment Patient Details Name: Javier Graham MRN: [id number] DOB: [birth year] Today's Date: 11/03/2023   History of Present Illness 68 y.o. male admitted through ED via EMS d/t altered mental status of unknown etiology.  Concern this may be related to patient's history of glioblastoma.  Admitted to [facility] with acute encephalopathy.  MRI brain ordered.  PMH: glioblastoma s/p resection, diabetes mellitus type 2, hypertension, insomnia.    PT Comments  AxO x 1 present with expressive aphasia and requiring repeat simple commands.  Exhibits LEFT side neglect/vision field.  Hemianopsia. Engaging in good eye contact. Assisted to seated EOB was very difficult.  Pt pushing opposite direction and required + 2 Total Assist to transfer from supine to EOB.  Severe RIGHT lean/pushing with in ability to self correct.  Poor spacial awareness.  Disequilirium. Required MAX Assist with Therapist sitting next to Pt on his RIGHT to attempt correction. Max Assist to correctly place B LE on floor.  Perfomred seated reaching activity of assisting Pt cross midline to adress his LEFT side and reach for object.  Pt present with impaired proprioception and delayed motor control requiring hand over hand asisst to complete.  R grasp is poor.  Pt was unable to reach with  L UE.  Pt also present with Right downward head tilt.  Also appears to have Hemianopsia LEFT side.  Attempted amb was unsuccessful.  General transfer comment: required Max/Total Assist to rise from elevated bed present with severe Pushing Syndrom and RIGHT lean.  Max rigidity/reflect fear of falling.  Poor motor control and decreased weght shift to LEFT. General Gait Details: Attempted sit to stand with STEDY was unsucessful due to STRONG PUSHING in opposite diection.  Used B Radiographer, therapeutic.  Hand over hand assist to plave B UE's up onto walker platform.  Hand over hand assist for correct grip on walker.  from elevated bed, Total Assist to push  weight forward to attempt steppage response was very difficult.  Severe RIGHT lean and inability to advance L LE.  Therapy Tech physically pushing L LE forward during swing phase of gait.  Max posterior pushing from Pt.  Barely made it 2 feet from the bed whern a third assist could place recliner behind Pt. Multiple pillows to position in recliner due to SEVERE RIGHT LEAN and HIP THRUSTING. Pt will need ST Rehab at SNF to address mobility and functional decline prior to safely returning home. Pt is a MAXI MOVE back to bed.  Wrote on white board.   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  Severe RIGHT lean/pushing with in ability to self correct.  Poor spacial awareness.  Disequilirium. Required MAX Assist with Therapist sitting next to Pt on his RIGHT to attempt correction. Max  Assist to correctly place B LE on floor.  Perfomred seated reaching activity of assisting Pt cross midline to adress his LEFT side and reach for object.  Pt present with impaired proprioception and delayed motor control requiring hand over hand asisst to complete.  R grasp is poor.  Pt was unable to reach with  L UE.  Pt also present with Right downward head tilt.  Also  appears to have Hemianopsia LEFT side.    Transfers Overall transfer level: Needs assistance Equipment used: Bilateral platform walker Transfers: Sit to/from Stand Sit to Stand: Max assist, +2 physical assistance, +2 safety/equipment, From elevated surface, Total assist           General transfer comment: required Max/Total Assist to rise from elevated bed present with severe Pushing Syndrom and RIGHT lean.  Max rigidity/reflect fear of falling.  Poor motor control and decreased weght shift to LEFT.    Ambulation/Gait Ambulation/Gait assistance: Total assist, +2 physical assistance, +2 safety/equipment Gait Distance (Feet): 2 Feet Assistive device: Bilateral platform walker Gait Pattern/deviations: Leaning posteriorly, Narrow base of support, Ataxic, Festinating, Antalgic Gait velocity: decreased     General Gait Details: Attempted sit to stand with STEDY was unsucessful due to STRONG PUSHING in opposite diection.  Used B Radiographer, therapeutic.  Hand over hand assist to plave B UE's up onto walker platform.  Hand over hand assist for correct grip on walker.  from elevated bed, Total Assist to push weight forward to attempt steppage response was very difficult.  Severe RIGHT lean and inability to advance L LE.  Therapy Tech physically pushing L LE forward during swing phase of gait.  Max posterior pushing from Pt.  Barely made it 2 feet from the bed whern a third assist could place recliner behind Pt.   Stairs             Wheelchair Mobility     Tilt Bed    Modified Rankin (Stroke Patients Only)       Balance                                            Communication Communication Communication: Impaired Factors Affecting Communication: Difficulty expressing self;Reduced clarity of speech  Cognition Arousal: Alert Behavior During Therapy: Flat affect   PT - Cognitive impairments: Orientation, Memory, Attention, Initiation, Sequencing,  Problem solving, Safety/Judgement, Awareness                       PT - Cognition Comments: AxO x 1 present with expressive aphasia and requiring repeat simple commands.  Exhibits LEFT side neglect/vision field.  Engaging in good eye contact and engaging. Following commands: Intact Following commands impaired: Follows one step commands inconsistently    Cueing Cueing Techniques: Verbal cues, Gestural cues, Tactile cues, Visual cues  Exercises      General Comments        Pertinent Vitals/Pain Pain Assessment Pain Assessment: No/denies pain    Home Living                          Prior Function            PT Goals (current goals can now be found in the care plan section) Progress towards PT goals: Progressing toward goals    Frequency    Min 2X/week  PT Plan      Co-evaluation              AM-PAC PT 6 Clicks Mobility   Outcome Measure  Help needed turning from your back to your side while in a flat bed without using bedrails?: Total Help needed moving from lying on your back to sitting on the side of a flat bed without using bedrails?: Total Help needed moving to and from a bed to a chair (including a wheelchair)?: Total Help needed standing up from a chair using your arms (e.g., wheelchair or bedside chair)?: Total Help needed to walk in hospital room?: Total Help needed climbing 3-5 steps with a railing? : Total 6 Click Score: 6    End of Session Equipment Utilized During Treatment: Gait belt Activity Tolerance: Patient tolerated treatment well Patient left: in chair;with call bell/phone within reach;with chair alarm set Nurse Communication: Mobility status;Need for lift equipment PT Visit Diagnosis: Other abnormalities of gait and mobility (R26.89);Muscle weakness (generalized) (M62.81);History of falling (Z91.81)     Time: 9059-8991 PT Time Calculation (min) (ACUTE ONLY): 28 min  Charges:    $Therapeutic Activity:  23-37 mins PT General Charges $$ ACUTE PT VISIT: 1 Visit                     Katheryn Leap  PTA Acute  Rehabilitation Services Office M-F          [phone]

## 2023-11-03 NOTE — Inpatient Diabetes Management (Addendum)
 Inpatient Diabetes Program Recommendations  AACE/ADA: New Consensus Statement on Inpatient Glycemic Control (2015)  Target Ranges:  Prepandial:   less than 140 mg/dL      Peak postprandial:   less than 180 mg/dL (1-2 hours)      Critically ill patients:  140 - 180 mg/dL   Lab Results  Component Value Date   GLUCAP 333 (H) 11/03/2023   HGBA1C 7.0 (H) 12/19/2018    Review of Glycemic Control  Latest Reference Range & Units 11/01/23 21:18 11/02/23 07:26 11/02/23 12:08 11/02/23 16:20 11/02/23 22:48 11/03/23 08:06  Glucose-Capillary 70 - 99 mg/dL 673 (H) 629 (H) 662 (H) 267 (H) 308 (H) 333 (H)  (H): Data is abnormally high Diabetes history: Type 2 DM Outpatient Diabetes medications: Victoza  1.8 mg every day, Metformin 1000 mg BID, Amaryl  4 mg every day, Humalog 0-8 units TID Current orders for Inpatient glycemic control: Novolog  0-15 units TID & HS   Inpatient Diabetes Program Recommendations:     Consider adding Semglee 12 units every day and Novolog  3 units TID (assuming patient consuming >50% of meals)  Secure chat sent to md.  Thanks, Tinnie Minus, MSN, RNC-OB Diabetes Coordinator [phone] (8a-5p)

## 2023-11-03 NOTE — TOC Progression Note (Addendum)
 Transition of Care [facility]) - Progression Note    Patient Details  Name: Javier Graham MRN: [id number] Date of Birth: 10/19/1955  Transition of Care [facility]) CM/SW Contact  Alfonse JONELLE Rex, RN Phone Number: 11/03/2023, 3:18 PM  Clinical Narrative:  Met with spouse at bedside to review short term rehab/SNF bed offers (  [facility], [address], [place], [place]). Spouse requests NCM to reach out to SNF that had not yet responded. NCM text request for short term rehab review to [place] [facility], [place] at [place], [place] at [facility]), await for possible bed offers. Spouse is reviewing current bed offers.   -3:50pm Call from [place] [facility] [place], unable to accept due to chemotherapy treatments.    -3:50pm Text sent to [facility] w/[facility], notified per Dr Freada , looks like  plans is for iv chemotherapy every 2 weeks. Isaiah checking to see if facility can provide transport.   -4:10pm Isaiah with [facility] extended short term rehab bed offer, spouse would like to tour facility. SNF auth initiated, Auth ID [id number], auth pending.        Barriers to Discharge: Continued Medical Work up  Expected Discharge Plan and Services In-house Referral: NA Discharge Planning Services: NA   Living arrangements for the past 2 months: Single Family Home                 DME Arranged: N/A DME Agency: NA       HH Arranged: NA HH Agency: NA         Social Determinants of Health (SDOH) Interventions SDOH Screenings   Food Insecurity: Patient Unable To Answer (10/31/2023)  Housing: Unknown (10/31/2023)  Transportation Needs: Patient Unable To Answer (10/31/2023)  Utilities: Patient Unable To Answer (10/31/2023)  Financial Resource Strain: High Risk (07/10/2023)   Received from [facility]  Social Connections: Unknown (10/31/2023)  Tobacco Use: Low Risk  (10/30/2023)    Readmission Risk Interventions    10/31/2023    8:49 AM  Readmission Risk  Prevention Plan  Post Dischage Appt Complete  Medication Screening Complete  Transportation Screening Complete

## 2023-11-03 NOTE — Assessment & Plan Note (Signed)
 Diagnosed by Dr. Matthews in 2020 Patient was to take a 1 year course of Plavix  however patient appears to still be on this medication. Will continue for now, follow-up as an outpatient.

## 2023-11-03 NOTE — Assessment & Plan Note (Signed)
 Persisting symptoms of lethargy, confusion and weakness Continuing dexamethasone  MRI performed 6/24 unremarkable Obtaining remainder of encephalopathy workup including urinalysis, vitamin B12, TSH, folate, ammonia, VBG PT evaluated the patient and is recommending skilled physical therapy in a skilled nursing facility.

## 2023-11-03 NOTE — Plan of Care (Signed)

## 2023-11-04 DIAGNOSIS — C719 Malignant neoplasm of brain, unspecified: Secondary | ICD-10-CM | POA: Diagnosis not present

## 2023-11-04 DIAGNOSIS — G9340 Encephalopathy, unspecified: Secondary | ICD-10-CM | POA: Diagnosis not present

## 2023-11-04 DIAGNOSIS — Z7189 Other specified counseling: Secondary | ICD-10-CM

## 2023-11-04 DIAGNOSIS — I10 Essential (primary) hypertension: Secondary | ICD-10-CM | POA: Diagnosis not present

## 2023-11-04 DIAGNOSIS — G9389 Other specified disorders of brain: Secondary | ICD-10-CM | POA: Diagnosis not present

## 2023-11-04 LAB — COMPREHENSIVE METABOLIC PANEL WITH GFR
ALT: 25 U/L (ref 0–44)
AST: 14 U/L — ABNORMAL LOW (ref 15–41)
Albumin: 4.3 g/dL (ref 3.5–5.0)
Alkaline Phosphatase: 69 U/L (ref 38–126)
Anion gap: 13 (ref 5–15)
BUN: 36 mg/dL — ABNORMAL HIGH (ref 8–23)
CO2: 25 mmol/L (ref 22–32)
Calcium: 10.3 mg/dL (ref 8.9–10.3)
Chloride: 94 mmol/L — ABNORMAL LOW (ref 98–111)
Creatinine, Ser: 1.03 mg/dL (ref 0.61–1.24)
GFR, Estimated: >60 mL/min (ref >60)
Glucose, Bld: 331 mg/dL — ABNORMAL HIGH (ref 70–99)
Potassium: 5 mmol/L (ref 3.5–5.1)
Sodium: 132 mmol/L — ABNORMAL LOW (ref 135–145)
Total Bilirubin: 1.8 mg/dL — ABNORMAL HIGH (ref 0.0–1.2)
Total Protein: 7.7 g/dL (ref 6.5–8.1)

## 2023-11-04 LAB — CBC WITH DIFFERENTIAL/PLATELET
Abs Immature Granulocytes: 0.37 10*3/uL — ABNORMAL HIGH (ref 0.00–0.07)
Basophils Absolute: 0 10*3/uL (ref 0.0–0.1)
Basophils Relative: 0 %
Eosinophils Absolute: 0 10*3/uL (ref 0.0–0.5)
Eosinophils Relative: 0 %
HCT: 48.8 % (ref 39.0–52.0)
Hemoglobin: 17 g/dL (ref 13.0–17.0)
Immature Granulocytes: 3 %
Lymphocytes Relative: 5 %
Lymphs Abs: 0.7 10*3/uL (ref 0.7–4.0)
MCH: 31.1 pg (ref 26.0–34.0)
MCHC: 34.8 g/dL (ref 30.0–36.0)
MCV: 89.2 fL (ref 80.0–100.0)
Monocytes Absolute: 0.9 10*3/uL (ref 0.1–1.0)
Monocytes Relative: 7 %
Neutro Abs: 11.8 10*3/uL — ABNORMAL HIGH (ref 1.7–7.7)
Neutrophils Relative %: 85 %
Platelets: 319 10*3/uL (ref 150–400)
RBC: 5.47 MIL/uL (ref 4.22–5.81)
RDW: 12.3 % (ref 11.5–15.5)
WBC: 13.8 10*3/uL — ABNORMAL HIGH (ref 4.0–10.5)
nRBC: 0 % (ref 0.0–0.2)

## 2023-11-04 LAB — GLUCOSE, CAPILLARY
Glucose-Capillary: 316 mg/dL — ABNORMAL HIGH (ref 70–99)
Glucose-Capillary: 341 mg/dL — ABNORMAL HIGH (ref 70–99)
Glucose-Capillary: 271 mg/dL — ABNORMAL HIGH (ref 70–99)
Glucose-Capillary: 274 mg/dL — ABNORMAL HIGH (ref 70–99)

## 2023-11-04 LAB — URINALYSIS, ROUTINE W REFLEX MICROSCOPIC
Bacteria, UA: NONE SEEN
Bilirubin Urine: NEGATIVE
Glucose, UA: >=500 mg/dL — AB
Hgb urine dipstick: NEGATIVE
Ketones, ur: 5 mg/dL — AB
Leukocytes,Ua: NEGATIVE
Nitrite: NEGATIVE
Protein, ur: NEGATIVE mg/dL
Specific Gravity, Urine: 1.03 (ref 1.005–1.030)
pH: 5 (ref 5.0–8.0)

## 2023-11-04 LAB — FOLATE: Folate: 10.4 ng/mL (ref >5.9)

## 2023-11-04 LAB — BLOOD GAS, VENOUS
Acid-base deficit: 8.8 mmol/L — ABNORMAL HIGH (ref 0.0–2.0)
Bicarbonate: 23.7 mmol/L (ref 20.0–28.0)
O2 Saturation: 31.7 %
Patient temperature: 37
pCO2, Ven: 80 mmHg (ref 44–60)
pH, Ven: 7.08 — CL (ref 7.25–7.43)
pO2, Ven: <31 mmHg — CL (ref 32–45)

## 2023-11-04 LAB — AMMONIA: Ammonia: 20 umol/L (ref 9–35)

## 2023-11-04 LAB — VITAMIN B12: Vitamin B-12: 761 pg/mL (ref 180–914)

## 2023-11-04 LAB — HIV ANTIBODY (ROUTINE TESTING W REFLEX): HIV Screen 4th Generation wRfx: NONREACTIVE

## 2023-11-04 LAB — MAGNESIUM: Magnesium: 2.5 mg/dL — ABNORMAL HIGH (ref 1.7–2.4)

## 2023-11-04 LAB — HEMOGLOBIN A1C
Hgb A1c MFr Bld: 7.5 % — ABNORMAL HIGH (ref 4.8–5.6)
Mean Plasma Glucose: 168.55 mg/dL

## 2023-11-04 LAB — TSH: TSH: 0.412 u[IU]/mL (ref 0.350–4.500)

## 2023-11-04 MED ORDER — INSULIN GLARGINE-YFGN 100 UNIT/ML ~~LOC~~ SOLN
18.0000 [IU] | Freq: Every day | SUBCUTANEOUS | Status: DC
  Administered 2023-11-05: 18 [IU] via SUBCUTANEOUS
  Filled 2023-11-04: qty 0.18

## 2023-11-04 MED ORDER — INSULIN ASPART 100 UNIT/ML IJ SOLN
6.0000 [IU] | Freq: Three times a day (TID) | INTRAMUSCULAR | Status: DC
  Administered 2023-11-05 (×3): 6 [IU] via SUBCUTANEOUS

## 2023-11-04 NOTE — Assessment & Plan Note (Addendum)
 Follows with Dr. Buckley Unclear as to whether patient's progressive decline is due to glioblastoma progression or due to radiation encephalopathy as noted above. Case discussed with Dr. Buckley today who wishes to proceed with Avastin infusions shortly after discharge, likely to begin on 7/3 in the cancer center Prognosis guarded

## 2023-11-04 NOTE — Assessment & Plan Note (Signed)
 Diagnosed by Dr. Matthews in 2020 Patient was to take a 1 year course of Plavix  however patient appears to still be on this medication. Will continue for now, follow-up as an outpatient.

## 2023-11-04 NOTE — TOC Progression Note (Addendum)
 Transition of Care [facility]) - Progression Note    Patient Details  Name: Javier Graham MRN: [id number] Date of Birth: 1955/11/26  Transition of Care [facility]) CM/SW Contact  Sonda Manuella Quill, RN Phone Number: 11/04/2023, 12:42 PM  Clinical Narrative:    Ins auth received; Plan Auth ID # [id number], Auth ID # [id number]; start date 11/04/23, end date 11/07/23; spoke w/ pt's wife Javier Graham; she says they have selected [facility]; Dr Kenard notified via secure chat.  [phone] called facility, and spoke w/ Heron Bean; she says no one from admissions is in the bldg; she also says admissions not available until Monday, and gave POC [facility] [phone]); LVM for Isaiah; awaiting return call.   Barriers to Discharge: Continued Medical Work up  Expected Discharge Plan and Services In-house Referral: NA Discharge Planning Services: NA   Living arrangements for the past 2 months: Single Family Home                 DME Arranged: N/A DME Agency: NA       HH Arranged: NA HH Agency: NA         Social Determinants of Health (SDOH) Interventions SDOH Screenings   Food Insecurity: Patient Unable To Answer (10/31/2023)  Housing: Unknown (10/31/2023)  Transportation Needs: Patient Unable To Answer (10/31/2023)  Utilities: Patient Unable To Answer (10/31/2023)  Financial Resource Strain: High Risk (07/10/2023)   Received from [facility]  Social Connections: Unknown (10/31/2023)  Tobacco Use: Low Risk  (10/30/2023)    Readmission Risk Interventions    10/31/2023    8:49 AM  Readmission Risk Prevention Plan  Post Dischage Appt Complete  Medication Screening Complete  Transportation Screening Complete

## 2023-11-04 NOTE — Evaluation (Signed)
 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 male with PMH: glioblastoma s/p resection, DM-2, HTN, insomnia. He presented to the hospital on 10/30/23 due to AMS of unknown etiology. MRI brain was unremarkable, CT head negative for acute intracranial abnormality. SLP ordered for cognitive-linguistic evaluation on 6/27.   Assessment / Plan / Recommendation Clinical Impression  Patient presents with mod-severe cognitive-linguistic impairments as per this evaluation. He had baseline impairments leading OP OT to recommend SLP order due to patient scoring 4 out of 30 on SLUMS examination. In addition, he had scored a 17 on the MOCA on 08/08/23. Currently, patient exhibits significant impairments in attention, initiation and awareness. His spouse was feeding him lunch when SLP in the room and he required frequent cues to chew his food up. When given food items such as chocolate chip cookie and chocolate ice cream, his mastication and oral transit was  more timely. Spouse reported that his appetite recently at home has been limited to approximately 1/2 a sandwich at a meal. He verbally communicated at 1-2 word level very infrequently during session. His spouse reported that at home, he was having agitation in the afternoon from his steroid medications, like sundowning. SLP is recommending a trial of skilled intervention to determine benefit and patient's ability to participate. SLP recommended upon discharge to SNF as well.    SLP Assessment  SLP Recommendation/Assessment: Patient needs continued Speech Language Pathology Services     Assistance Recommended at Discharge  Frequent or constant Supervision/Assistance  Functional Status Assessment Patient has had a recent decline in their functional status and/or demonstrates limited ability to make significant improvements in function in a reasonable and predictable amount of time  Frequency and Duration min 1 x/week  1 week      SLP Evaluation Cognition  Overall Cognitive Status: Impaired/Different from baseline Arousal/Alertness: Awake/alert Orientation Level: Other (comment) (unable to determine)       Comprehension  Auditory Comprehension Overall Auditory Comprehension: Impaired at baseline    Expression Expression Primary Mode of Expression: Verbal Verbal Expression Overall Verbal Expression: Impaired at baseline Initiation: Impaired Interfering Components: Attention;Premorbid deficit Non-Verbal Means of Communication: Not applicable   Oral / Motor  Oral Motor/Sensory Function Overall Oral Motor/Sensory Function: Other (comment) (no focal weakness but patient unable to follow commands)            Norleen IVAR Blase, MA, CCC-SLP Speech Therapy

## 2023-11-04 NOTE — Assessment & Plan Note (Signed)
 Continue lisinopril

## 2023-11-04 NOTE — Assessment & Plan Note (Signed)
 Guarded prognosis Unclear at this point as to whether patient's continued decline is due to radiation encephalopathy or simply progression of the patient's glioblastoma In my discussions with family I have suggested a consideration of alternative goals of care if the patient fails to respond to Avastin infusions.

## 2023-11-04 NOTE — Assessment & Plan Note (Addendum)
 Persisting symptoms of lethargy, confusion and weakness the family reports is worse compared to yesterday Presentation possibly secondary to radiation encephalopathy or simply progression of the patient's glioblastoma Continuing dexamethasone  MRI performed 6/24 with feeling no significant interval change in the appearance of the surgical resection cavity Obtaining remainder of encephalopathy workup unrevealing for alternative causes. PT evaluated the patient and is recommending skilled physical therapy in a skilled nursing facility. Family agreeable to placement in a skilled nursing facility, will likely be discharged on 6/30

## 2023-11-04 NOTE — Assessment & Plan Note (Addendum)
 Continued severe hyperglycemia secondary to steroid use Increasing basal bolus insulin  regimen to 18 units of Semglee daily and 6 units of NovoLog  before every meal Hemoglobin A1c 7.5%

## 2023-11-04 NOTE — Progress Notes (Signed)
 Critical lab result called for blood gas results. MD notified new orders placed.

## 2023-11-04 NOTE — Progress Notes (Signed)
 PROGRESS NOTE   STOY [name]  FMW:[id number] DOB: [birth year] DOA: 10/30/2023 PCP: Lari Elspeth BRAVO, MD   Date of Service: the patient was seen and examined on 11/04/2023  Brief Narrative:  Javier Graham is a 68 y.o. male with a history of glioblastoma s/p resection, radiation, and temozolomide , T2DM, HTN, insomnia who presented to the ED on 10/30/2023 with cognitive and motor dysfunction favored to be due to CNS inflammation following radiation.   Hospitalist group was then called to assess the patient for admission the hospital.  Neurooncology was consulted and recommended high dose steroids.  Arrangements were additionally made for initiation of Avastin every 2 weeks after discharge.   Despite initiation of steroids, patient continued to exhibit substantial lethargy and confusion.  PT evaluation was obtained and it was felt that patient would benefit from skilled physical therapy in a skilled nursing facility.    Assessment & Plan Radiation encephalopathy Persisting symptoms of lethargy, confusion and weakness the family reports is worse compared to yesterday Presentation possibly secondary to radiation encephalopathy or simply progression of the patient's glioblastoma Continuing dexamethasone  MRI performed 6/24 with feeling no significant interval change in the appearance of the surgical resection cavity Obtaining remainder of encephalopathy workup unrevealing for alternative causes. PT evaluated the patient and is recommending skilled physical therapy in a skilled nursing facility. Family agreeable to placement in a skilled nursing facility, will likely be discharged on 6/30 Glioblastoma, IDH-wildtype (HCC) Follows with Dr. Buckley Unclear as to whether patient's progressive decline is due to glioblastoma progression or due to radiation encephalopathy as noted above. Case discussed with Dr. Buckley today who wishes to proceed with Avastin infusions shortly after discharge, likely to  begin on 7/3 in the cancer center Prognosis guarded Uncontrolled type 2 diabetes mellitus with hyperglycemia, without long-term current use of insulin  (HCC) Continued severe hyperglycemia secondary to steroid use Increasing basal bolus insulin  regimen to 18 units of Semglee daily and 6 units of NovoLog  before every meal Hemoglobin A1c 7.5% Essential hypertension Continue lisinopril  Left trochlear nerve palsy Diagnosed by Dr. Matthews in 2020 Patient was to take a 1 year course of Plavix  however patient appears to still be on this medication. Will continue for now, follow-up as an outpatient. Goals of care, counseling/discussion Guarded prognosis Unclear at this point as to whether patient's continued decline is due to radiation encephalopathy or simply progression of the patient's glioblastoma In my discussions with family I have suggested a consideration of alternative goals of care if the patient fails to respond to Avastin infusions.    Subjective:  Patient unable to answer questions appropriately due to significant lethargy and confusion.  Physical Exam:  Vitals:   11/03/23 0638 11/03/23 1400 11/03/23 2116 11/04/23 0507  BP: (!) 136/90 (!) 169/91 (!) 140/90 (!) 148/92  Pulse: 94 (!) 109  96  Resp: 20 18 18 16   Temp: 97.7 F (36.5 C) 98.4 F (36.9 C) 97.9 F (36.6 C) 97.6 F (36.4 C)  TempSrc: Oral Oral Oral Oral  SpO2: 100% 100% 99% 98%  Weight:      Height:         Constitutional: Lethargic but arousable, oriented x 1, not in distress, intermittently following commands Skin: no rashes, no lesions, good skin turgor noted. Eyes: Pupils are equally reactive to light.  No evidence of scleral icterus or conjunctival pallor.  ENMT: Moist mucous membranes noted.   Respiratory: clear to auscultation bilaterally, no wheezing, no crackles. Normal respiratory effort. No accessory muscle use.  Cardiovascular: Regular rate and rhythm, no murmurs / rubs / gallops. No extremity  edema. 2+ pedal pulses. No carotid bruits.  Abdomen: Abdomen is soft and nontender.  No evidence of intra-abdominal masses.  Positive bowel sounds noted in all quadrants.   Musculoskeletal: No joint deformity upper and lower extremities. Good ROM, no contractures. Normal muscle tone.    Data Reviewed:  I have personally reviewed and interpreted labs, imaging.  Significant findings are   CBC: Recent Labs  Lab 10/30/23 1756 11/04/23 0635  WBC 11.2* 13.8*  NEUTROABS 9.2* 11.8*  HGB 15.6 17.0  HCT 44.3 48.8  MCV 88.2 89.2  PLT 251 319   Basic Metabolic Panel: Recent Labs  Lab 10/30/23 1756 11/04/23 0635  NA 134* 132*  K 4.4 5.0  CL 103 94*  CO2 24 25  GLUCOSE 167* 331*  BUN 19 36*  CREATININE 0.85 1.03  CALCIUM 9.7 10.3  MG  --  2.5*   GFR: Estimated Creatinine Clearance: 78.7 mL/min (by C-G formula based on SCr of 1.03 mg/dL). Liver Function Tests: Recent Labs  Lab 10/30/23 1756 11/04/23 0635  AST 40 14*  ALT 35 25  ALKPHOS 54 69  BILITOT 1.1 1.8*  PROT 7.5 7.7  ALBUMIN 4.2 4.3    Coagulation Profile: Recent Labs  Lab 10/30/23 1756  INR 1.0    Code Status:  Full code.      Severity of Illness:  The appropriate patient status for this patient is INPATIENT. Inpatient status is judged to be reasonable and necessary in order to provide the required intensity of service to ensure the patient's safety. The patient's presenting symptoms, physical exam findings, and initial radiographic and laboratory data in the context of their chronic comorbidities is felt to place them at high risk for further clinical deterioration. Furthermore, it is not anticipated that the patient will be medically stable for discharge from the hospital within 2 midnights of admission.   * I certify that at the point of admission it is my clinical judgment that the patient will require inpatient hospital care spanning beyond 2 midnights from the point of admission due to high intensity  of service, high risk for further deterioration and high frequency of surveillance required.*  Time spent:  53 minutes  Author:  Zachary JINNY Ba MD  11/04/2023 8:09 AM

## 2023-11-05 ENCOUNTER — Inpatient Hospital Stay (HOSPITAL_COMMUNITY)

## 2023-11-05 DIAGNOSIS — I10 Essential (primary) hypertension: Secondary | ICD-10-CM | POA: Diagnosis not present

## 2023-11-05 DIAGNOSIS — G40901 Epilepsy, unspecified, not intractable, with status epilepticus: Secondary | ICD-10-CM

## 2023-11-05 DIAGNOSIS — Z85841 Personal history of malignant neoplasm of brain: Secondary | ICD-10-CM | POA: Diagnosis not present

## 2023-11-05 DIAGNOSIS — Z7189 Other specified counseling: Secondary | ICD-10-CM

## 2023-11-05 DIAGNOSIS — E119 Type 2 diabetes mellitus without complications: Secondary | ICD-10-CM | POA: Diagnosis not present

## 2023-11-05 DIAGNOSIS — G9341 Metabolic encephalopathy: Secondary | ICD-10-CM | POA: Diagnosis not present

## 2023-11-05 DIAGNOSIS — R569 Unspecified convulsions: Secondary | ICD-10-CM

## 2023-11-05 DIAGNOSIS — G9340 Encephalopathy, unspecified: Secondary | ICD-10-CM | POA: Diagnosis not present

## 2023-11-05 DIAGNOSIS — C719 Malignant neoplasm of brain, unspecified: Secondary | ICD-10-CM | POA: Diagnosis not present

## 2023-11-05 LAB — BLOOD GAS, ARTERIAL
Bicarbonate: 20.9 mmol/L (ref 20.0–28.0)
Drawn by: 20012
Drawn by: 20012
O2 Saturation: 99.7 mmol/L (ref 0.0–2.0)
Patient temperature: 36.4
Patient temperature: 99.7
pH, Arterial: 24 mmHg — ABNORMAL LOW (ref 7.35–7.45)
pH, Arterial: 7.54 L/min — AB (ref 7.35–7.45)
pO2, Arterial: 100 mmHg — AB (ref 83–48)
pO2, Arterial: 100 mmol/L (ref 83–28.0)

## 2023-11-05 LAB — MAGNESIUM: Magnesium: 2.5 mg/dL — ABNORMAL HIGH (ref 1.7–2.4)

## 2023-11-05 LAB — COMPREHENSIVE METABOLIC PANEL WITH GFR
ALT: 24 U/L (ref 0–44)
ALT: 22 U/L (ref 0–44)
AST: 12 U/L — ABNORMAL LOW (ref 15–41)
AST: 10 U/L — ABNORMAL LOW (ref 15–41)
Albumin: 3.9 g/dL (ref 3.5–5.0)
Albumin: 3.9 g/dL (ref 3.5–5.0)
Alkaline Phosphatase: 64 U/L (ref 38–126)
Alkaline Phosphatase: 66 U/L (ref 38–126)
Anion gap: 15 (ref 5–15)
Anion gap: 10 (ref 5–15)
BUN: 37 mg/dL — ABNORMAL HIGH (ref 8–23)
BUN: 40 mg/dL — ABNORMAL HIGH (ref 8–23)
CO2: 18 mmol/L — ABNORMAL LOW (ref 22–32)
CO2: 19 mmol/L — ABNORMAL LOW (ref 22–32)
Calcium: 10 mg/dL (ref 8.9–10.3)
Calcium: 10.1 mg/dL (ref 8.9–10.3)
Chloride: 96 mmol/L — ABNORMAL LOW (ref 98–111)
Chloride: 101 mmol/L (ref 98–111)
Creatinine, Ser: 0.81 mg/dL (ref 0.61–1.24)
Creatinine, Ser: 0.78 mg/dL (ref 0.61–1.24)
GFR, Estimated: >60 mL/min (ref >60)
GFR, Estimated: >60 mL/min (ref >60)
Glucose, Bld: 336 mg/dL — ABNORMAL HIGH (ref 70–99)
Glucose, Bld: 238 mg/dL — ABNORMAL HIGH (ref 70–99)
Potassium: 4.5 mmol/L (ref 3.5–5.1)
Potassium: 4.8 mmol/L (ref 3.5–5.1)
Sodium: 129 mmol/L — ABNORMAL LOW (ref 135–145)
Sodium: 130 mmol/L — ABNORMAL LOW (ref 135–145)
Total Bilirubin: 1.6 mg/dL — ABNORMAL HIGH (ref 0.0–1.2)
Total Bilirubin: 1.4 mg/dL — ABNORMAL HIGH (ref 0.0–1.2)
Total Protein: 7.1 g/dL (ref 6.5–8.1)
Total Protein: 7.2 g/dL (ref 6.5–8.1)

## 2023-11-05 LAB — GLUCOSE, CAPILLARY
Glucose-Capillary: 152 mg/dL — ABNORMAL HIGH (ref 70–99)
Glucose-Capillary: 184 mg/dL — ABNORMAL HIGH (ref 70–99)
Glucose-Capillary: 235 mg/dL — ABNORMAL HIGH (ref 70–99)
Glucose-Capillary: 292 mg/dL — ABNORMAL HIGH (ref 70–99)
Glucose-Capillary: 354 mg/dL — ABNORMAL HIGH (ref 70–99)
Glucose-Capillary: 358 mg/dL — ABNORMAL HIGH (ref 70–99)

## 2023-11-05 LAB — MRSA NEXT GEN BY PCR, NASAL: MRSA by PCR Next Gen: NOT DETECTED

## 2023-11-05 LAB — RPR: RPR Ser Ql: NONREACTIVE

## 2023-11-05 LAB — BETA-HYDROXYBUTYRIC ACID: Beta-Hydroxybutyric Acid: 0.15 mmol/L (ref 0.05–0.27)

## 2023-11-05 MED ORDER — LORAZEPAM 2 MG/ML IJ SOLN: 2.0000 mg | Freq: Once | INTRAMUSCULAR | Status: AC

## 2023-11-05 MED ORDER — INSULIN ASPART 100 UNIT/ML IJ SOLN
7.0000 [IU] | Freq: Four times a day (QID) | INTRAMUSCULAR | Status: DC
  Administered 2023-11-05 – 2023-11-06 (×3): 7 [IU] via SUBCUTANEOUS

## 2023-11-05 MED ORDER — POTASSIUM CHLORIDE 2 MEQ/ML IV SOLN
INTRAVENOUS | Status: DC
  Filled 2023-11-05 (×3): qty 1000

## 2023-11-05 MED ORDER — LEVETIRACETAM (KEPPRA) 500 MG/5 ML ADULT IV PUSH
3000.0000 mg | Freq: Once | INTRAVENOUS | Status: AC
  Administered 2023-11-05: 3000 mg via INTRAVENOUS
  Filled 2023-11-05: qty 30

## 2023-11-05 MED ORDER — KCL-LACTATED RINGERS 20 MEQ/L IV SOLN: INTRAVENOUS | Status: DC

## 2023-11-05 MED ORDER — CHLORHEXIDINE GLUCONATE CLOTH 2 % EX PADS
6.0000 | MEDICATED_PAD | Freq: Every day | CUTANEOUS | Status: DC
  Administered 2023-11-05 – 2023-11-06 (×2): 6 via TOPICAL

## 2023-11-05 MED ORDER — CHLORHEXIDINE GLUCONATE CLOTH 2 % EX PADS
6.0000 | MEDICATED_PAD | Freq: Every day | CUTANEOUS | Status: DC
Start: 2023-11-06 — End: 2023-11-05

## 2023-11-05 MED ORDER — INSULIN ASPART 100 UNIT/ML IJ SOLN
0.0000 [IU] | Freq: Four times a day (QID) | INTRAMUSCULAR | Status: DC
  Administered 2023-11-05: 7 [IU] via SUBCUTANEOUS
  Administered 2023-11-06: 4 [IU] via SUBCUTANEOUS
  Administered 2023-11-06: 3 [IU] via SUBCUTANEOUS

## 2023-11-05 MED ORDER — PANTOPRAZOLE SODIUM 40 MG IV SOLR
40.0000 mg | INTRAVENOUS | Status: DC
  Administered 2023-11-05 – 2023-11-07 (×3): 40 mg via INTRAVENOUS
  Filled 2023-11-05 (×3): qty 10

## 2023-11-05 MED ORDER — SODIUM CHLORIDE 0.9 % IV SOLN: INTRAVENOUS | Status: AC | PRN

## 2023-11-05 MED ORDER — LEVETIRACETAM (KEPPRA) 500 MG/5 ML ADULT IV PUSH
1500.0000 mg | Freq: Once | INTRAVENOUS | Status: AC
  Administered 2023-11-05: 1500 mg via INTRAVENOUS
  Filled 2023-11-05: qty 15

## 2023-11-05 MED ORDER — LORAZEPAM 2 MG/ML IJ SOLN
INTRAMUSCULAR | Status: AC
  Administered 2023-11-05: 2 mg via INTRAVENOUS
  Filled 2023-11-05: qty 1

## 2023-11-05 MED ORDER — LEVETIRACETAM (KEPPRA) 500 MG/5 ML ADULT IV PUSH
500.0000 mg | Freq: Two times a day (BID) | INTRAVENOUS | Status: DC
  Filled 2023-11-05: qty 5

## 2023-11-05 MED ORDER — HYDRALAZINE HCL 20 MG/ML IJ SOLN: 10.0000 mg | Freq: Four times a day (QID) | INTRAMUSCULAR | Status: DC | PRN

## 2023-11-05 MED ORDER — INSULIN GLARGINE-YFGN 100 UNIT/ML ~~LOC~~ SOLN
24.0000 [IU] | Freq: Every day | SUBCUTANEOUS | Status: DC
  Administered 2023-11-06: 24 [IU] via SUBCUTANEOUS
  Filled 2023-11-05: qty 0.24

## 2023-11-05 MED ORDER — LEVETIRACETAM (KEPPRA) 500 MG/5 ML ADULT IV PUSH
1500.0000 mg | Freq: Two times a day (BID) | INTRAVENOUS | Status: DC
  Administered 2023-11-06 – 2023-11-07 (×3): 1500 mg via INTRAVENOUS
  Filled 2023-11-05 (×3): qty 15

## 2023-11-05 MED ORDER — ORAL CARE MOUTH RINSE: 15.0000 mL | OROMUCOSAL | Status: DC | PRN

## 2023-11-05 NOTE — Assessment & Plan Note (Addendum)
 Discontinued while NPO As needed intravenous hydralazine for markedly elevated blood pressure.

## 2023-11-05 NOTE — Progress Notes (Signed)
 Report given to 4N ICU nurse, and currently in transit to [facility] via [facility]. This RN relayed the message to [facility] RN for 4N RN to give pt's wife a call once admitted to the floor. Pt is not in apparent distress, breathing is unlabored, and symmetrical.

## 2023-11-05 NOTE — Plan of Care (Signed)
 ?  Problem: Clinical Measurements: ?Goal: Ability to maintain clinical measurements within normal limits will improve ?Outcome: Progressing ?Goal: Will remain free from infection ?Outcome: Progressing ?Goal: Diagnostic test results will improve ?Outcome: Progressing ?  ?

## 2023-11-05 NOTE — Progress Notes (Signed)
 [facility] Physician-Brief Progress Note Patient Name: Javier Graham DOB: May 09, 1956 MRN: [id number]   Date of Service  11/05/2023  HPI/Events of Note  68 year old male with a history of type 2 diabetes mellitus, essential hypertension, glioblastoma status post resection, radiation, and immunotherapy admitted for reduced neurological response and inability to communicate with family and twitching on examination concerning for seizure activity.  Plan is to transfer to [facility] for long-term EEG monitoring.  On examination, patient is tachycardic but otherwise normal vitals.  Saturating 97% on 2 L.  Results show hyperglycemia but otherwise no significant laboratory abnormalities.  Status post Keppra infusion but no continuous meds at this time  No dropped to tree on examination, patient is somnolent but protecting his airway.  eICU Interventions  Evaluated by neurology, 4.5 g of Keppra given thus far today.  AEDs and LTM EEG per neurology  Steroid-induced hyperglycemia, maintain sliding scale insulin  and scheduled insulin   MRI and CT pending, will follow with results  DVT prophylaxis with enoxaparin  GI prophylaxis with home pantoprazole        [name] 11/05/2023, 8:32 PM

## 2023-11-05 NOTE — Progress Notes (Signed)
 STAT LTM EEG hooked up and recording with MRI compatible leads. [facility] is monitoring. Test button was tested.

## 2023-11-05 NOTE — Plan of Care (Signed)

## 2023-11-05 NOTE — Progress Notes (Addendum)
 PROGRESS NOTE   Javier Graham  FMW:[id number] DOB: [birth year] DOA: 10/30/2023 PCP: Lari Elspeth BRAVO, MD   Date of Service: the patient was seen and examined on 11/05/2023  Brief Narrative:  Javier Graham is a 68 y.o. male with a history of glioblastoma s/p resection, radiation, and temozolomide , T2DM, HTN, insomnia who presented to the ED on 10/30/2023 with cognitive and motor dysfunction initially thought to be due to CNS inflammation following radiation.   The hospitalist group was then called to assess the patient for admission the hospital.  Neurooncology was consulted and recommended high dose steroids.  Arrangements were additionally made for initiation of Avastin every 2 weeks after discharge.   Despite initiation of steroids, patient continued to exhibit substantial lethargy and confusion.  PT evaluation was obtained and it was felt that patient would benefit from skilled physical therapy in a skilled nursing facility.   Patient continued to clinically deteriorate with progressively worsening lethargy.  On 6/29, the patient began to exhibit twitching of the jaw concerning for focal seizures and patient was initiated on intravenous Keppra.  Despite this, patient began to exhibit recurrent seizure activity concerning for status epilepticus prompting teleneurology consultation with Dr. Lindzen, administration of intravenous Ativan and transfer the patient to [facility], ICU for continuous EEG monitoring.  Patient's progressively worsening presentation suggests underlying status epilepticus as the cause of the patient's progressively worsening mentation over the past several days.   Assessment & Plan Status epilepticus (HCC) Recurrent bouts of rhythmic twitching of the jaw beginning at approximately 11:30 AM today refractory to initiation of 3 g of Keppra load After development of subsequent right arm and right leg twitching and concern for developing status patient was administered 2 mg  of intravenous Ativan at approximately 7 PM with abatement of symptoms. Stat teleneurology consultation with Dr. Merrianne obtained who has recommended transfer to [facility], ICU for continuous EEG monitoring Loading of an additional 1.5 g of Keppra also recommended. serial neurologic checks Etiology is likely secondary to underlying glioblastoma  Making patient n.p.o. 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 Lindzen via teleneurology.  Upon arrival to [facility], Dr. Jerrie will additionally evaluate in person. Due to severe lethargy/obtundation, making patient n.p.o. temporarily.  Hydrating with maintenance fluids.  Transitioned Protonix to IV.  Discontinued Plavix  and Seroquel . Glioblastoma, IDH-wildtype (HCC) Follows with Dr. Buckley Earlier in the hospitalization it was unclear as to whether patient's progressive decline is due to glioblastoma progression or due to radiation encephalopathy.  Now it is felt most likely that the patient has been suffering from status epilepticus. Case discussed with Dr. Buckley 6/28 who originally planned to proceed with Avastin infusions shortly after discharge, likely to begin on 7/3 in the cancer center Prognosis poor Uncontrolled type 2 diabetes mellitus with hyperglycemia, without long-term current use of insulin  (HCC) Continued severe hyperglycemia secondary to steroid use Increasing basal bolus insulin  regimen to 24 units of Semglee daily and 7 units of NovoLog  every 6 hours while patient is n.p.o. Hemoglobin A1c 7.5% Essential hypertension Discontinued while NPO As  needed intravenous hydralazine for markedly elevated blood pressure. 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 Will discontinue for now while patient is NPO. Goals of care, counseling/discussion Prognosis worsening as I am concerned that development of status epilepticus is likely secondary to progression of patient's glioblastoma Wife is interested in exploring palliative care/hospice options.  Palliative care consultation placed morning of 6/29. CODE STATUS reviewed with wife on 6/29.  She states that DNR/DNI would be more aligned with the patient's wishes.    Subjective:  Patient unable to answer questions appropriately due to near obtundation  Physical Exam:  Vitals:   11/05/23 1746 11/05/23 1917 11/05/23 1935 11/05/23 2000  BP: (!) 139/94 112/79 105/72 115/74  Pulse: (!) 121 (!) 112  (!) 105  Resp: 18  (!) 26 20  Temp: 98.7 F (37.1 C)  97.6 F (36.4 C)   TempSrc: Oral  Oral   SpO2: 99% 100% 98% 98%  Weight:      Height:         Constitutional: Extremely lethargic, only responsive to painful stimuli.  Not in distress, intermittently following commands Skin: no rashes, no lesions, good skin turgor noted. Eyes: Pupils are equally reactive to light.  No evidence of scleral icterus or conjunctival pallor.  ENMT: Moist mucous membranes noted.   Respiratory: clear to auscultation bilaterally, no wheezing, no crackles. Normal respiratory effort. No accessory muscle use.  Cardiovascular: Regular rate and rhythm, no murmurs / rubs / gallops. No extremity edema. 2+ pedal pulses. No carotid bruits.  Abdomen: Abdomen is soft and nontender.  No evidence of intra-abdominal masses.  Positive bowel sounds noted in all quadrants.   Musculoskeletal: No joint deformity upper and lower extremities. Good ROM, no contractures. Normal muscle tone.  Neuro: Rhythmic twitching of  the jaw right upper extremity and right lower extremity.  Patient not responding to verbal stimuli but is responding to painful stimuli.  Patient not following commands.  Hyporreflexive   Data Reviewed:  I have personally reviewed and interpreted labs, imaging.  Significant findings are   CBC: Recent Labs  Lab 10/30/23 1756 11/04/23 0635  WBC 11.2* 13.8*  NEUTROABS 9.2* 11.8*  HGB 15.6 17.0  HCT 44.3 48.8  MCV 88.2 89.2  PLT 251 319   Basic Metabolic Panel: Recent Labs  Lab 10/30/23 1756 11/04/23 0635 11/05/23 0519 11/05/23 1832  NA 134* 132* 129* 130*  K 4.4 5.0 4.5 4.8  CL 103 94* 96* 101  CO2 24 25 18* 19*  GLUCOSE 167* 331* 336* 238*  BUN 19 36* 37* 40*  CREATININE 0.85 1.03 0.81 0.78  CALCIUM 9.7 10.3 10.0 10.1  MG  --  2.5* 2.5*  --    GFR: Estimated Creatinine Clearance: 101.3 mL/min (by C-G formula based on SCr of 0.78 mg/dL). Liver Function Tests: Recent Labs  Lab 10/30/23 1756 11/04/23 0635 11/05/23 0519 11/05/23 1832  AST 40 14* 12* 10*  ALT 35 25 24 22   ALKPHOS 54 69 64 66  BILITOT 1.1 1.8* 1.6* 1.4*  PROT 7.5 7.7 7.1 7.2  ALBUMIN 4.2 4.3 3.9 3.9    Coagulation Profile: Recent Labs  Lab 10/30/23 1756  INR 1.0    Code Status:  DNR/DNI  Code status confirmed with wife via phone conversation today.    CRITICAL CARE ATTESTATION:  Patient is significant risk of deterioration and mortality due to likely status epilepticus in the setting of glioblastoma multiforme.  Actively coordinating with various consultants including neurology and PCCM, initiating and titrating antiepileptics  and benzodiazepines, obtaining and interpreting serial labs and performing serial bedside physical exams including neurologic assessments.  Critical care time spent:  64 minutes.  Author:  Zachary JINNY Ba MD  11/05/2023 8:49 PM

## 2023-11-05 NOTE — Assessment & Plan Note (Addendum)
 First several days of hospitalization, patient was felt to be suffering from radiation-induced encephalopathy  However, as patient clinically declined on systemic steroids, it has become apparent the patient may have been suffering from status epilepticus this entire time  Continuing dexamethasone  Initiation of antiepileptics as noted above Neurology consultation obtained as noted above with Dr. Lindzen via teleneurology.  Upon arrival to [facility], Dr. Jerrie will additionally evaluate in person. Due to severe lethargy/obtundation, making patient n.p.o. temporarily.  Hydrating with maintenance fluids.  Transitioned Protonix to IV.  Discontinued Plavix  and Seroquel .

## 2023-11-05 NOTE — Assessment & Plan Note (Addendum)
 Continued severe hyperglycemia secondary to steroid use Increasing basal bolus insulin  regimen to 24 units of Semglee daily and 7 units of NovoLog  every 6 hours while patient is n.p.o. Hemoglobin A1c 7.5%

## 2023-11-05 NOTE — Progress Notes (Signed)
 Pt very lethargic this am, nonverbal. Noted rhythmic twitching of pt's R cheek intermittently approx 1100, MD notified and up to see pt. STAT meds given, seizure precautions in place (O2 and suction set up in rm, padded SRs). Family in to see pt, many questions answered and support offered. Noted twitching stopped shortly after loading dose of Keppra IV given. Noted cont decline in neuro status and VS this evening, Rapid Response Nurse and MD notified.

## 2023-11-05 NOTE — Assessment & Plan Note (Addendum)
 Diagnosed by Dr. Matthews in 2020 Patient was to take a 1 year course of Plavix  however patient appears to still be on this medication. Will discontinue for now while patient is NPO.

## 2023-11-05 NOTE — Significant Event (Signed)
 Rapid Response Event Note   Reason for Call :  Minimally Responsive   Initial Focused Assessment:  Patient unable to keep eyes open when loudly speaking his name and sternal rubbing, patient also unable to verbalize anything. Hemodynamically stable, temp 98.7, BP 139/94, pulse 121 regular and strong, Spo2 99 on 2L.  AM labs possibly suspicious if DKA with sugars consistently high despite long acting and short acting insulin . CO2 also 18.  Seizure activity continues, tele neuro consult deemed patient need transfer to [facility] ICU for neurology.  Per MD Lindzen push 2mg  now, if seizure does not stop push another 2mg .  Interventions:  CMP, and Beta-hydroxy labs  Neuro tele consult  2mg  ativan  Plan of Care:  Transfer to [facility] for neurology  MD Notified: Zachary Ba MD Call Time: 1700 Arrival Time: 1715 End Time: 1915   Omega LILLETTE Settles, RN

## 2023-11-05 NOTE — Assessment & Plan Note (Addendum)
 Follows with Dr. Buckley Earlier in the hospitalization it was unclear as to whether patient's progressive decline is due to glioblastoma progression or due to radiation encephalopathy.  Now it is felt most likely that the patient has been suffering from status epilepticus. Case discussed with Dr. Buckley 6/28 who originally planned to proceed with Avastin infusions shortly after discharge, likely to begin on 7/3 in the cancer center Prognosis poor

## 2023-11-05 NOTE — Assessment & Plan Note (Signed)
 Recurrent bouts of rhythmic twitching of the jaw beginning at approximately 11:30 AM today refractory to initiation of 3 g of Keppra load After development of subsequent right arm and right leg twitching and concern for developing status patient was administered 2 mg of intravenous Ativan at approximately 7 PM with abatement of symptoms. Stat teleneurology consultation with Dr. Merrianne obtained who has recommended transfer to [facility], ICU for continuous EEG monitoring Loading of an additional 1.5 g of Keppra also recommended. serial neurologic checks Etiology is likely secondary to underlying glioblastoma  Making patient n.p.o. for now

## 2023-11-05 NOTE — Progress Notes (Signed)
 NEUROLOGY CONSULT FOLLOW UP NOTE   Date of service: November 05, 2023 Patient Name: Javier Graham MRN:  [id number] DOB:  [birth year]  68 year old male with history of glioblastoma s/p resection, radiation, and temozolomide  admitted on 6/23 for evaluation of general decline in function and difficulty communicating with family. He had onset on Sunday 6/29 of jaw twitching around 11 AM that has continued despite a 3000 mg IV loading dose of Keppra; resolved after Ativan and additional 1500 mg Keppra.  - Initial exam by Dr. Lindzen by tele revealed a severely encephalopathic patient with left sided weakness and sensory deficit who started to exhibit recurrence of jaw twitching as well as RUE and RLE twitching at the end of the examination, despite Keppra having been loaded at 1:00 PM. The twitching seen on exam subsided with Ativan 2 mg IV.  - Impression: New onset of RIGHT sided seizure-like activity in an encephalopathic patient with an ipsilateral (RIGHT) parietal lobe GBM resection cavity and adjacent extensive white matter signal abnormalities consistent with GBM recurrence versus inflammation secondary to recent radiation therapy. Suspect that his AMS noted previously this admission was due to subclinical seizure activity.   Nursing noted lethargy this morning, non-verbal patient, then at 11:00 noted right facial twitching.    Interval Hx/subjective   - Has remained minimally responsive - No further twitching     Vitals   Vitals:   11/05/23 1254 11/05/23 1614 11/05/23 1700 11/05/23 1746  BP: (!) 131/98 (!) 143/95  (!) 139/94  Pulse: 94 (!) 120  (!) 121  Resp: 18 18  18   Temp: 98.8 F (37.1 C) 98.7 F (37.1 C)  98.7 F (37.1 C)  TempSrc: Oral Oral  Oral  SpO2: 99% 98% 97% 99%  Weight:      Height:         Body mass index is 26.39 kg/m.  Physical Exam   Physical Exam  Constitutional: Appears well-developed and well-nourished.  Psych: Minimally interactive HENT: Scleral edema  is absent, right eye some mild right lateral subconj hemorrhage. Atraumatic  MSK: no joint deformities.  Cardiovascular: Normal rate and regular rhythm.  Respiratory: Breathing comfortably, 2 L O2 to maintain sats > 94%   GI: Soft.  No distension. There is no tenderness.   Neuro: Mental Status: Does not open eyes spontaneously, to voice or noxious stimulation Does not follow any commands Cranial Nerves: II: No blink to threat. Pupils are equal, round, and reactive to light. 2 mm to 1 mm III,IV, VI/VIII: EOMI to VOR, sluggish but present  V/VII: Facial sensation is symmetric to saline stim VIII: No clear response to voice X/XI: Intact gag XII: Unable to assess tongue protrusion secondary to patient's mental status  Motor/Sensory: Tone is normal. Bulk is normal. Localizes slightly with the RUE 2/5, likely triple flexion in the bilateral lower extremities, 1/  Deep Tendon Reflexes: Absent throughout  Plantars: Toes are mute bilaterally.  Cerebellar: Unable to assess secondary to patient's mental status    Medications  Current Facility-Administered Medications:    0.9 %  sodium chloride  infusion, , Intravenous, PRN, Shalhoub, Zachary PARAS, MD, Last Rate: 10 mL/hr at 11/05/23 1255, New Bag at 11/05/23 1255   acetaminophen  (TYLENOL ) tablet 650 mg, 650 mg, Oral, Q6H PRN **OR** acetaminophen  (TYLENOL ) suppository 650 mg, 650 mg, Rectal, Q6H PRN, Dorrell, Robert, MD   baclofen  (LIORESAL ) tablet 5 mg, 5 mg, Oral, Q6H PRN, Kenard Zachary PARAS, MD, 5 mg at 11/03/23 2126   clopidogrel  (PLAVIX ) tablet  75 mg, 75 mg, Oral, Daily, Bryn Bernardino NOVAK, MD, 75 mg at 11/05/23 1022   dexamethasone  (DECADRON ) injection 8 mg, 8 mg, Intravenous, Q8H, Dorrell, Robert, MD, 8 mg at 11/05/23 1314   enoxaparin  (LOVENOX ) injection 40 mg, 40 mg, Subcutaneous, Q24H, Dorrell, Robert, MD, 40 mg at 11/05/23 0849   insulin  aspart (novoLOG ) injection 0-20 Units, 0-20 Units, Subcutaneous, Q6H, Shalhoub, Zachary PARAS, MD   insulin   aspart (novoLOG ) injection 7 Units, 7 Units, Subcutaneous, Q6H, Shalhoub, Zachary PARAS, MD   [START ON 11/06/2023] insulin  glargine-yfgn (SEMGLEE) injection 24 Units, 24 Units, Subcutaneous, Daily, Shalhoub, Zachary PARAS, MD   lactated ringers 1,000 mL with potassium chloride 20 mEq infusion, , Intravenous, Continuous, Shalhoub, Zachary PARAS, MD, Last Rate: 75 mL/hr at 11/05/23 1726, New Bag at 11/05/23 1726   [COMPLETED] levETIRAcetam (KEPPRA) undiluted injection 3,000 mg, 3,000 mg, Intravenous, Once, 3,000 mg at 11/05/23 1300 **FOLLOWED BY** levETIRAcetam (KEPPRA) undiluted injection 500 mg, 500 mg, Intravenous, Q12H, Shalhoub, Zachary PARAS, MD   lisinopril  (ZESTRIL ) tablet 5 mg, 5 mg, Oral, Daily, Dorrell, Robert, MD, 5 mg at 11/05/23 1022   ondansetron  (ZOFRAN ) tablet 4 mg, 4 mg, Oral, Q6H PRN **OR** ondansetron  (ZOFRAN ) injection 4 mg, 4 mg, Intravenous, Q6H PRN, Dorrell, Robert, MD, 4 mg at 11/03/23 1716   pantoprazole (PROTONIX) EC tablet 40 mg, 40 mg, Oral, Daily, Shalhoub, Zachary PARAS, MD, 40 mg at 11/05/23 1022   QUEtiapine  (SEROQUEL ) tablet 50 mg, 50 mg, Oral, QHS, Dorrell, Lamar, MD, 50 mg at 11/04/23 2128  Labs and Diagnostic Imaging   CBC:  Recent Labs  Lab 10/30/23 1756 11/04/23 0635  WBC 11.2* 13.8*  NEUTROABS 9.2* 11.8*  HGB 15.6 17.0  HCT 44.3 48.8  MCV 88.2 89.2  PLT 251 319    Basic Metabolic Panel:  Lab Results  Component Value Date   NA 130 (L) 11/05/2023   K 4.8 11/05/2023   CO2 19 (L) 11/05/2023   GLUCOSE 238 (H) 11/05/2023   BUN 40 (H) 11/05/2023   CREATININE 0.78 11/05/2023   CALCIUM 10.1 11/05/2023   GFRNONAA >60 11/05/2023   GFRAA 90 12/19/2018    HgbA1c:  Lab Results  Component Value Date   HGBA1C 7.5 (H) 11/04/2023   INR  Lab Results  Component Value Date   INR 1.0 10/30/2023   APTT  Lab Results  Component Value Date   APTT 26 10/30/2023    CT head (6/23): 1. No acute intracranial abnormality. 2. Postoperative changes from prior right posterior  craniotomy with underlying right parieto-occipital GBM, better appreciated on prior brain MRI. No significant mass effect or midline shift. 3. 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   MRI brain without contrast (6/24): 1. Status post right posterior craniotomy for resection of a lesion within the right posteromedial parietal lobe. There is no significant interval change in the appearance of the surgical resection cavity on this noncontrast MRI of the brain. 2. Stable lesion along the planum sphenoidale, compatible with meningioma.  EEG:  Pending, note will be updated on my initial review of EEG  Assessment   AREN PRYDE is a 68 y.o. man with history of glioblastoma s/p resection, radiation, and temozolomide  admitted on 6/23 for evaluation of general decline in function and difficulty communicating with family. On Sunday 6/29 of jaw twitching around 11 AM that has continued despite a 3000 mg IV loading dose of Keppra; resolved after Ativan and additional 1500 mg Keppra.   Goals of care discussed with family,  they confirmed that intubation even for a time limited trial for seizure control would not be within his goals given his progressive decline since radiation and the frustrations he has expressed. This is very reasonable and will continue with DNR / DNI code status at this time.   Recommendations  - s/p Keppra 3000 mg load, followed by 1500 mg to complete full 4500 g max loading dose  - Continue Keppra at 1500 mg BID  - LTM EEG with MRI compatible leads if MRI not completed prior to EEG (will prioritize EEG) - STAT MRI brain w/ and w/o contrast (only with contrast obtained previously and he has had further decline)  - Discussed with Dr. Layman CCM at bedside  ______________________________________________________________________  Lola Jernigan MD-PhD [facility] [phone]  CRITICAL CARE Performed by: Lola LITTIE Jernigan   Total critical care time: 45 minutes  Critical care time was exclusive of separately billable procedures and treating other patients.  Critical care was necessary to treat or prevent imminent or life-threatening deterioration.  Critical care was time spent personally by me on the following activities: development of treatment plan with patient and/or surrogate as well as nursing, discussions with consultants, evaluation of patient's response to treatment, examination of patient, obtaining history from patient or surrogate, ordering and performing treatments and interventions, ordering and review of laboratory studies, ordering and review of radiographic studies, pulse oximetry and re-evaluation of patient's condition.

## 2023-11-05 NOTE — Assessment & Plan Note (Addendum)
 Prognosis worsening as I am concerned that development of status epilepticus is likely secondary to progression of patient's glioblastoma Wife is interested in exploring palliative care/hospice options.  Palliative care consultation placed morning of 6/29. CODE STATUS reviewed with wife on 6/29.  She states that DNR/DNI would be more aligned with the patient's wishes.

## 2023-11-05 NOTE — Consult Note (Signed)
 NAME:  Javier Graham, MRN:  [id number], DOB:  1956-01-05, LOS: 6 ADMISSION DATE:  10/30/2023, CONSULTATION DATE:  11/05/23 REFERRING MD:  TRH, CHIEF COMPLAINT:  seizure   History of Present Illness:  68 yo male with GBM s/p resection in February as well as radiation and initiation of Temozolomide  who presented to [facility] on 6/23 after general decline and difficulty with communication. Pt was admitted and started on high dose steroids and Bevacizumab.  Pt was progressing for the duration of his stay and was being evaluated for SNF based on PT/OT recommendations. He was being preparing for discharge to SNF tomorrow (6/30). However, thru the evening pt deteriorated mentally, unable able to verbalize and minimally opening eyes to sternal rub or verbal stimuli. Subsequently, pt had onset new onset of jaw twitch, progressing to RUE and RLE twitches as well. Pt was loaded with Keppra at present after virtual visit as well as 2mg  ativan. Neuro recommended transfer to ICU at [facility] for LTM and closer neuro monitoring.   Pt has been quite somnolent since presentation to [facility] neuro ICU. Protecting airway in that he is not sonorous, on 2L for comfort and sats are stable in high 90's. He grimaces to painful stimuli, but does not remove noxious stimuli.  Family has been contacted by primary team as well as neuro, pt would not want intubation. Family is clear per report that he is to remain DNR/DNI based on his previously expressed wishes.   CCM asked to consult for med management during the evaluation at this time. MRI pending as well as LTM. All history and information is based on chart review and report from prior physician  Pertinent  Medical History  GBM s/p resection/radiation/temozolomide  T2dm H/o diplopia H/o headache Insomnia H/o HTN  Significant Hospital Events: Including procedures, antibiotic start and stop dates in addition to other pertinent events   Admitted 6/23 to [facility] with ams likely 2/2 GBM s/p  resection Transferred to [facility] for LTM and closer neuro monitoring 2/2 progressive encephalopathy  Interim History / Subjective:    Objective    Blood pressure 103/81, pulse (!) 112, temperature 98.3 F (36.8 C), temperature source Axillary, resp. rate 18, height 6' 1 (1.854 m), weight 90.7 kg, SpO2 94%.        Intake/Output Summary (Last 24 hours) at 11/05/2023 2144 Last data filed at 11/05/2023 1800 Gross per 24 hour  Intake 360 ml  Output 1200 ml  Net -840 ml   Filed Weights   10/30/23 1757  Weight: 90.7 kg    Examination: General: nad, somnolent but grimaces to painful stim, laying supine without issue HENT: ncat, perrla, mmmp Lungs: ctab Cardiovascular: rrr Abdomen: soft nt/nd/bs+ Extremities: no c/c/e not mobilizing any 4 extremities on my exam, reports that he has mobilized R  Neuro: as above. Overall somnolent and while protecting airway is not purposeful or following commands at this time GU: deferred  Resolved problem list   Assessment and Plan  GBM s/p resection/radiation and temozolomide   Acute onset sz 2/2 above Acute encephalopathy,2/2 above t2dm H/o HTN Insomnia -MRI and LTM per neuro -cont AED/steroid per neuro -avoid hypotension, stable at this time -ssi -cont goals of care, pt has previously expressed his desires for DNR/DNI so need to continue with family support and pershaps consider palliative care   Best Practice (right click and Reselect all SmartList Selections daily)   Diet/type: NPO DVT prophylaxis LMWH Pressure ulcer(s): N/A GI prophylaxis: PPI Lines: N/A Foley:  N/A Code Status:  limited Last date of multidisciplinary goals of care discussion [pending. Family at this time is not present]  Labs   CBC: Recent Labs  Lab 10/30/23 1756 11/04/23 0635  WBC 11.2* 13.8*  NEUTROABS 9.2* 11.8*  HGB 15.6 17.0  HCT 44.3 48.8  MCV 88.2 89.2  PLT 251 319    Basic Metabolic Panel: Recent Labs  Lab 10/30/23 1756 11/04/23 0635  11/05/23 0519 11/05/23 1832  NA 134* 132* 129* 130*  K 4.4 5.0 4.5 4.8  CL 103 94* 96* 101  CO2 24 25 18* 19*  GLUCOSE 167* 331* 336* 238*  BUN 19 36* 37* 40*  CREATININE 0.85 1.03 0.81 0.78  CALCIUM 9.7 10.3 10.0 10.1  MG  --  2.5* 2.5*  --    GFR: Estimated Creatinine Clearance: 101.3 mL/min (by C-G formula based on SCr of 0.78 mg/dL). Recent Labs  Lab 10/30/23 1756 11/04/23 0635  WBC 11.2* 13.8*    Liver Function Tests: Recent Labs  Lab 10/30/23 1756 11/04/23 0635 11/05/23 0519 11/05/23 1832  AST 40 14* 12* 10*  ALT 35 25 24 22   ALKPHOS 54 69 64 66  BILITOT 1.1 1.8* 1.6* 1.4*  PROT 7.5 7.7 7.1 7.2  ALBUMIN 4.2 4.3 3.9 3.9   No results for input(s): LIPASE, AMYLASE in the last 168 hours. Recent Labs  Lab 11/04/23 0635  AMMONIA 20    ABG    Component Value Date/Time   PHART 7.54 (H) 11/04/2023 0916   PCO2ART 24 (L) 11/04/2023 0916   PO2ART 100 11/04/2023 0916   HCO3 20.9 11/04/2023 0916   ACIDBASEDEF 0.3 11/04/2023 0916   O2SAT 99.7 11/04/2023 0916     Coagulation Profile: Recent Labs  Lab 10/30/23 1756  INR 1.0    Cardiac Enzymes: No results for input(s): CKTOTAL, CKMB, CKMBINDEX, TROPONINI in the last 168 hours.  HbA1C: Hgb A1c MFr Bld  Date/Time Value Ref Range Status  11/04/2023 06:35 AM 7.5 (H) 4.8 - 5.6 % Final    Comment:    (NOTE) Diagnosis of Diabetes The following HbA1c ranges recommended by the American Diabetes Association (ADA) may be used as an aid in the diagnosis of diabetes mellitus.  Hemoglobin             Suggested A1C NGSP%              Diagnosis  <5.7                   Non Diabetic  5.7-6.4                Pre-Diabetic  >6.4                   Diabetic  <7.0                   Glycemic control for                       adults with diabetes.    12/19/2018 08:58 AM 7.0 (H) 4.8 - 5.6 % Final    Comment:             Prediabetes: 5.7 - 6.4          Diabetes: >6.4          Glycemic control for adults  with diabetes: <7.0     CBG: Recent Labs  Lab 11/04/23 2200 11/05/23 0736 11/05/23 1128 11/05/23 1607 11/05/23 1939  GLUCAP 274* 354* 358* 292*  235*    Review of Systems:   As per HPI  Past Medical History:  He,  has a past medical history of Diabetes (HCC), Diplopia, and Headache.   Surgical History:   Past Surgical History:  Procedure Laterality Date   BUNIONECTOMY       Social History:   reports that he has never smoked. He has never used smokeless tobacco. He reports that he does not drink alcohol and does not use drugs.   Family History:  His family history includes Diabetes in his brother, father, sister, and another family member; Heart disease in his father, mother, and another family member; Stroke in his father.   Allergies No Known Allergies   Home Medications  Prior to Admission medications   Medication Sig Start Date End Date Taking? Authorizing Provider  ascorbic acid (VITAMIN C) 500 MG tablet Take 500 mg by mouth daily.   Yes [provider]  Baclofen  5 MG TABS Take 1 tablet (5 mg total) by mouth every 6 (six) hours as needed (cramping). 10/26/23  Yes Vaslow, Zachary K, MD  clopidogrel  (PLAVIX ) 75 MG tablet Take 1 tablet (75 mg total) by mouth daily. 12/19/18  Yes Onita Duos, MD  dexamethasone  (DECADRON ) 2 MG tablet Take 1 tablet (2 mg total) by mouth daily. 10/19/23  Yes Vaslow, Zachary K, MD  glimepiride  (AMARYL ) 4 MG tablet Take 1 tablet (4 mg total) by mouth daily before breakfast. 06/17/16  Yes Kassie Mallick, MD  insulin  lispro (HUMALOG) 100 UNIT/ML KwikPen Inject 0-8 Units into the skin 3 (three) times daily as needed (bg over 200). 07/07/23  Yes [provider]  lisinopril  (ZESTRIL ) 5 MG tablet Take 5 mg by mouth daily.   Yes [provider]  metFORMIN (GLUCOPHAGE-XR) 500 MG 24 hr tablet Take 1,000 mg by mouth 2 (two) times daily with a meal.   Yes [provider]  ondansetron  (ZOFRAN ) 8 MG tablet Take 1 tablet (8 mg  total) by mouth every 8 (eight) hours as needed for nausea or vomiting. May take 30-60 minutes prior to Temodar  administration if nausea/vomiting occurs as needed. 10/19/23  Yes Vaslow, Zachary K, MD  QUEtiapine  (SEROQUEL ) 50 MG tablet Take 1 tablet (50 mg total) by mouth at bedtime. 10/24/23  Yes Vaslow, Zachary K, MD  Sennosides (SENOKOT PO) Take 1 tablet by mouth daily as needed.   Yes [provider]  temozolomide  (TEMODAR ) 140 MG capsule Take 1 capsule (140 mg total) by mouth daily. (Take with ONE 180 mg capsule for total daily dose of 320 mg). Take for 5 days on then 23 days off. May take on an empty stomach to decrease nausea & vomiting. 10/19/23  Yes Vaslow, Zachary K, MD  temozolomide  (TEMODAR ) 180 MG capsule Take 1 capsule (180 mg total) by mouth daily. (Take with ONE 140 mg capsule for a total daily dose of 320 mg). Take for 5 days on then 23 days off. May take on an empty stomach to decrease nausea & vomiting. 10/19/23  Yes Vaslow, Zachary K, MD  VICTOZA  18 MG/3ML SOPN INJECT 0.3 ML'S (1.8 MG TOTAL) INTO THE SKIN DAILY Patient taking differently: Inject 1.8 mg into the skin daily at 12 noon. 01/16/17  Yes Kassie Mallick, MD     Critical care time: 

## 2023-11-05 NOTE — Plan of Care (Signed)
 Discussed in front of patient plan of care, admission questions and discharge to [facility] with no evidence of learning at this time.  CN to contact family.  Problem: Education: Goal: Knowledge of General Education information will improve Description: Including pain rating scale, medication(s)/side effects and non-pharmacologic comfort measures Outcome: Not Progressing

## 2023-11-05 NOTE — Progress Notes (Signed)
 Spoke to patient's wife and updated her that we moved him to our unit while waiting for bed assignment at [facility]. [facility] has now arrived and will be transferring to [facility] room 25. [facility] staff will call wife after he arrives there and provide her with updates as needed.

## 2023-11-05 NOTE — Consult Note (Addendum)
 [facility] TeleNeurology Consult Services    Date of Service:  11/05/2023     Metrics: Symptoms: As per HPI.   Location of the provider: [facility]  Location of the patient: Javier Graham, MedSurg Floor  Time neurologist arrived:  6:41 PM   This consult was provided via telemedicine with 2-way video and audio communication. The patient/family was informed that care would be provided in this way and agreed to receive care in this manner.   Hospitalist notified of diagnostic impression and management plan at: 7:01 PM   Assessment: 68 year old male with history of glioblastoma s/p resection, radiation, and temozolomide  admitted on 6/23 for evaluation of general decline in function and difficulty communicating with family. He had onset today of jaw twitching that has continued despite a 3000 mg IV loading dose of Keppra.  - Exam reveals a severely encephalopathic patient with left sided weakness and sensory deficit who started to exhibit recurrence of jaw twitching as well as RUE and RLE twitching at the end of the examination, despite Keppra having been loaded at 1:00 PM. The twitching seen on exam subsided with Ativan 2 mg IV.  - MRI brain and CT head findings as documented below - Impression: New onset of RIGHT sided seizure-like activity in an encephalopathic patient with an ipsilateral (RIGHT) parietal lobe GBM resection cavity and adjacent extensive white matter signal abnormalities consistent with GBM recurrence versus inflammation secondary to recent radiation therapy. Suspect that his AMS noted previously this admission was due to subclinical seizure activity.      Recommendations: - Transfer to [facility] ICU for STAT LTM EEG and close neuromonitoring.  - Additional load of Keppra 1500 mg IV.  - Continue Keppra scheduled dosing at 500 mg IV BID for now. May need to titrate further pending repeat clinical exam at [facility] and LTM EEG - Will need a repeat exam on  arrival to [facility].  - Has received 2 mg IV Ativan with cessation of clinical seizure activity. May need repeated doses and addition of a second anticonvulsant if clinical seizure activity recurs.  - If LTM EEG shows continued seizure activity, may need to be intubated and started on burst suppression protocol.  - Inpatient seizure precautions.  - Discussed with [facility] via SecureChat - Discussed with Hospitalist service at the time of Teleneurology consultation      ------------------------------------------------------------------------------   History of Present Illness:  68 y.o. male with a history of glioblastoma s/p resection, radiation, and temozolomide  (patient of Dr. Buckley), T2DM, HTN and insomnia who presented to the ED on 10/30/2023 with cognitive and motor dysfunction favored to be due to CNS inflammation following radiation. Hospitalist group was then called to assess the patient for admission the hospital.  Neurooncology was consulted and recommended high dose steroids.  Arrangements were additionally made for initiation of Avastin every 2 weeks after discharge. Despite initiation of steroids, patient continued to exhibit substantial lethargy and confusion. He had onset today of jaw twitching at 11:30 AM that has continued despite a 3000 mg IV loading dose of Keppra.    CT head (6/23): 1. No acute intracranial abnormality. 2. Postoperative changes from prior right posterior craniotomy with underlying right parieto-occipital GBM, better appreciated on prior brain MRI. No significant mass effect or midline shift. 3. 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  MRI brain without contrast (6/24): 1. Status post right posterior craniotomy for resection of a lesion within the right posteromedial parietal lobe. There is  no significant interval change in the appearance of the surgical resection cavity on this noncontrast MRI of the brain. 2. Stable lesion  along the planum sphenoidale, compatible with meningioma.     Past Medical History: Past Medical History:  Diagnosis Date   Diabetes (HCC)    Diplopia    Headache      Past Surgical History: Past Surgical History:  Procedure Laterality Date   BUNIONECTOMY       Medications:  No current facility-administered medications on file prior to encounter.   Current Outpatient Medications on File Prior to Encounter  Medication Sig Dispense Refill   ascorbic acid (VITAMIN C) 500 MG tablet Take 500 mg by mouth daily.     Baclofen  5 MG TABS Take 1 tablet (5 mg total) by mouth every 6 (six) hours as needed (cramping). 90 tablet 0   clopidogrel  (PLAVIX ) 75 MG tablet Take 1 tablet (75 mg total) by mouth daily. 30 tablet 11   dexamethasone  (DECADRON ) 2 MG tablet Take 1 tablet (2 mg total) by mouth daily. 60 tablet 1   glimepiride  (AMARYL ) 4 MG tablet Take 1 tablet (4 mg total) by mouth daily before breakfast. 30 tablet 3   insulin  lispro (HUMALOG) 100 UNIT/ML KwikPen Inject 0-8 Units into the skin 3 (three) times daily as needed (bg over 200).     lisinopril  (ZESTRIL ) 5 MG tablet Take 5 mg by mouth daily.     metFORMIN (GLUCOPHAGE-XR) 500 MG 24 hr tablet Take 1,000 mg by mouth 2 (two) times daily with a meal.     ondansetron  (ZOFRAN ) 8 MG tablet Take 1 tablet (8 mg total) by mouth every 8 (eight) hours as needed for nausea or vomiting. May take 30-60 minutes prior to Temodar  administration if nausea/vomiting occurs as needed. 30 tablet 1   QUEtiapine  (SEROQUEL ) 50 MG tablet Take 1 tablet (50 mg total) by mouth at bedtime. 30 tablet 1   Sennosides (SENOKOT PO) Take 1 tablet by mouth daily as needed.     temozolomide  (TEMODAR ) 140 MG capsule Take 1 capsule (140 mg total) by mouth daily. (Take with ONE 180 mg capsule for total daily dose of 320 mg). Take for 5 days on then 23 days off. May take on an empty stomach to decrease nausea & vomiting. 5 capsule 0   temozolomide  (TEMODAR ) 180 MG capsule Take  1 capsule (180 mg total) by mouth daily. (Take with ONE 140 mg capsule for a total daily dose of 320 mg). Take for 5 days on then 23 days off. May take on an empty stomach to decrease nausea & vomiting. 5 capsule 0   VICTOZA  18 MG/3ML SOPN INJECT 0.3 ML'S (1.8 MG TOTAL) INTO THE SKIN DAILY (Patient taking differently: Inject 1.8 mg into the skin daily at 12 noon.) 9 pen 3       Social History: Drug Use: None Never smoker   Family History:  Reviewed in Epic   ROS: As per HPI    Examination:    BP (!) 139/94 (BP Location: Right Arm)   Pulse (!) 121   Temp 98.7 F (37.1 C) (Oral)   Resp 18   Ht 6' 1 (1.854 m)   Wt 90.7 kg   SpO2 99%   BMI 26.39 kg/m     Physical Exam HEENT- [place]/AT. No neck stiffness. Pillow-side of scalp is warm and diaphoretic.  Lungs- Respirations unlabored Extremities- Warm and well-perfused to BUE. Feet are cold to touch bilaterally but not discolored. Weak dorsalis  pedis pulses.   Neurological Examination Mental Status: Stuporous. Does not open eyes to voice or noxious, but will localize to sternal with his RUE.  Cranial Nerves: II: PERRL 3 mm bilaterally constricting to 2 mm. No reliable blink to threat bilaterally. Does not fixate with eyelids held open.  III,IV, VI: Eyes are at the midline without forced gaze deviation or nystagmus. Absent doll's eye reflex versus suppressed doll's eye reflex.  V: Grimaces to noxious earlobe stimulation bilaterally VII: Grimaces symmetrically VIII: No response to voice IX,X: Gag reflex deferred.  XI: Will move head slightly side to side to noxious XII: Unable to assess Motor/Sensory: RUE: Normal tone. Moves to chest with sternal rub. Shrugs shoulders, grimaces and slightly flexes at elbow with arm pinch. More movement than on the left.  LUE: Normal tone. Grimaces to arm pinch and makes gripping movements with weak contraction of biceps, but no withdrawal.  RLE: Reacts briskly to pinch and withdraws antigravity to  noxious plantar stimulation LLE: Reacts with a lag on the left and with lower amplitude movement and minimal movement to noxious plantar stimulation.  Deep Tendon Reflexes: Difficulty obtaining patellar reflexes Plantars: Right: downgoingLeft: downgoing Cerebellar/Gait: Unable to assess Other: Low amplitude jaw twitching with right arm semirhythmic low amplitude spasmodic twitching as well as RLE low amplitude spasmodic twitching seen at end of exam (after Keppra was loaded)     Patient/Family was informed the Neurology Consult would occur via TeleHealth consult by way of interactive audio and video telecommunications and consented to receiving care in this manner.   Patient is being evaluated for possible acute neurologic impairment and high pretest probability of imminent or life-threatening deterioration. I spent total of 40 minutes providing care to this patient, including time for face to face visit via telemedicine, review of medical records, imaging studies and discussion of findings with providers, the patient and/or family.   Electronically signed: Dr. Kamiah Fite

## 2023-11-06 ENCOUNTER — Other Ambulatory Visit: Payer: Self-pay | Admitting: *Deleted

## 2023-11-06 ENCOUNTER — Encounter (HOSPITAL_COMMUNITY)

## 2023-11-06 ENCOUNTER — Other Ambulatory Visit: Payer: Self-pay

## 2023-11-06 DIAGNOSIS — R569 Unspecified convulsions: Secondary | ICD-10-CM | POA: Diagnosis not present

## 2023-11-06 DIAGNOSIS — G40901 Epilepsy, unspecified, not intractable, with status epilepticus: Secondary | ICD-10-CM | POA: Diagnosis not present

## 2023-11-06 DIAGNOSIS — G40909 Epilepsy, unspecified, not intractable, without status epilepticus: Secondary | ICD-10-CM

## 2023-11-06 DIAGNOSIS — Z515 Encounter for palliative care: Secondary | ICD-10-CM | POA: Diagnosis not present

## 2023-11-06 DIAGNOSIS — C719 Malignant neoplasm of brain, unspecified: Secondary | ICD-10-CM | POA: Diagnosis not present

## 2023-11-06 DIAGNOSIS — D496 Neoplasm of unspecified behavior of brain: Secondary | ICD-10-CM

## 2023-11-06 DIAGNOSIS — Z7189 Other specified counseling: Secondary | ICD-10-CM | POA: Diagnosis not present

## 2023-11-06 LAB — GLUCOSE, CAPILLARY
Glucose-Capillary: 170 mg/dL — ABNORMAL HIGH (ref 70–99)
Glucose-Capillary: 226 mg/dL — ABNORMAL HIGH (ref 70–99)
Glucose-Capillary: 290 mg/dL — ABNORMAL HIGH (ref 70–99)
Glucose-Capillary: 325 mg/dL — ABNORMAL HIGH (ref 70–99)

## 2023-11-06 MED ORDER — INSULIN ASPART 100 UNIT/ML IJ SOLN
0.0000 [IU] | INTRAMUSCULAR | Status: DC
  Administered 2023-11-06: 11 [IU] via SUBCUTANEOUS
  Administered 2023-11-06: 7 [IU] via SUBCUTANEOUS
  Administered 2023-11-06 – 2023-11-07 (×2): 15 [IU] via SUBCUTANEOUS
  Administered 2023-11-07: 11 [IU] via SUBCUTANEOUS

## 2023-11-06 MED ORDER — INSULIN GLARGINE-YFGN 100 UNIT/ML ~~LOC~~ SOLN
28.0000 [IU] | Freq: Every day | SUBCUTANEOUS | Status: DC
  Administered 2023-11-07: 28 [IU] via SUBCUTANEOUS
  Filled 2023-11-06: qty 0.28

## 2023-11-06 MED ORDER — GADOBUTROL 1 MMOL/ML IV SOLN
9.0000 mL | Freq: Once | INTRAVENOUS | Status: AC | PRN
  Administered 2023-11-06: 9 mL via INTRAVENOUS

## 2023-11-06 MED ORDER — METOPROLOL TARTRATE 5 MG/5ML IV SOLN
5.0000 mg | INTRAVENOUS | Status: DC | PRN
  Administered 2023-11-06 – 2023-11-07 (×2): 5 mg via INTRAVENOUS
  Filled 2023-11-06 (×2): qty 5

## 2023-11-06 MED ORDER — MAGNESIUM SULFATE 4 GM/100ML IV SOLN
4.0000 g | Freq: Once | INTRAVENOUS | Status: AC
  Administered 2023-11-06: 4 g via INTRAVENOUS
  Filled 2023-11-06: qty 100

## 2023-11-06 NOTE — TOC Progression Note (Signed)
 Transition of Care [facility]) - Progression Note    Patient Details  Name: Javier Graham MRN: [id number] Date of Birth: 11-12-1955  Transition of [facility] CM/SW Contact  Inocente GORMAN Kindle, LCSW Phone Number: 11/06/2023, 10:02 AM  Clinical Narrative:    Patient transferred to [facility]. CSW following for medical improvement. Will likely need a new insurance authorization for [facility] at discharge as it expires 7/2 at midnight.      Barriers to Discharge: Continued Medical Work up  Expected Discharge Plan and Services In-house Referral: NA Discharge Planning Services: NA   Living arrangements for the past 2 months: Single Family Home                 DME Arranged: N/A DME Agency: NA       HH Arranged: NA HH Agency: NA         Social Determinants of Health (SDOH) Interventions SDOH Screenings   Food Insecurity: Patient Unable To Answer (10/31/2023)  Housing: Unknown (10/31/2023)  Transportation Needs: Patient Unable To Answer (10/31/2023)  Utilities: Patient Unable To Answer (10/31/2023)  Financial Resource Strain: High Risk (07/10/2023)   Received from [facility]  Social Connections: Unknown (10/31/2023)  Tobacco Use: Low Risk  (10/30/2023)    Readmission Risk Interventions    10/31/2023    8:49 AM  Readmission Risk Prevention Plan  Post Dischage Appt Complete  Medication Screening Complete  Transportation Screening Complete

## 2023-11-06 NOTE — Procedures (Addendum)
 Patient Name: Javier Graham  MRN: [id number]  Epilepsy Attending: Arlin MALVA Krebs  Referring Physician/Provider: Jerrie Lola LITTIE, MD  Duration: 11/05/2023 2316 to 11/06/2023 1157  Patient history: 68 y.o. man with history of glioblastoma s/p resection, radiation, and temozolomide  admitted on 6/23 for evaluation of general decline in function and difficulty communicating with family. On Sunday 6/29 of jaw twitching around 11 AM that has continued despite a 3000 mg IV loading dose of Keppra ; resolved after Ativan  and additional 1500 mg Keppra . EEG to evaluate for seizure.  Level of alertness: Awake, asleep  AEDs during EEG study: LEV  Technical aspects: This EEG study was done with scalp electrodes positioned according to the 10-20 International system of electrode placement. Electrical activity was reviewed with band pass filter of 1-70Hz , sensitivity of 7 uV/mm, display speed of 58mm/sec with a 60Hz  notched filter applied as appropriate. EEG data were recorded continuously and digitally stored.  Video monitoring was available and reviewed as appropriate.  Description: During awake state, no clear posterior dominant rhythm was seen.  Sleep was characterized by sleep spindles (12 to 14 Hz), maximal frontocentral region.  EEG showed continuous generalized and maximal right parieto-occipital region 3 to 6 Hz theta-delta slowing.  Sharp waves were also noted in right parieto-occipital region.  Hyperventilation and photic stimulation were not performed.     EEG was disconnected between 11/05/2023 2355 to 11/06/2023 0040 for MRI brain.   ABNORMALITY -Sharp wave, right parieto-occipital region - Continuous slow, generalized and maximal right parieto-occipital region  IMPRESSION: This study showed evidence of epileptogenicity and cortical dysfunction arising from right parieto-occipital region.  Additionally there is moderate to severe diffuse encephalopathy.  No definite seizures were  noted.  [name]

## 2023-11-06 NOTE — Consult Note (Addendum)
 NAME:  Javier Graham, MRN:  [id number], DOB:  04/11/1956, LOS: 7 ADMISSION DATE:  10/30/2023, CONSULTATION DATE:  11/05/23 REFERRING MD:  TRH, CHIEF COMPLAINT:  seizure   History of Present Illness:  68 yo male with GBM s/p resection in February as well as radiation and initiation of Temozolomide  who presented to [facility] on 6/23 after general decline and difficulty with communication. Pt was admitted and started on high dose steroids and Bevacizumab.  Pt was progressing for the duration of his stay and was being evaluated for SNF based on PT/OT recommendations. He was being preparing for discharge to SNF tomorrow (6/30). However, thru the evening pt deteriorated mentally, unable able to verbalize and minimally opening eyes to sternal rub or verbal stimuli. Subsequently, pt had onset new onset of jaw twitch, progressing to RUE and RLE twitches as well. Pt was loaded with Keppra at present after virtual visit as well as 2mg  ativan. Neuro recommended transfer to ICU at [facility] for LTM and closer neuro monitoring.   Pt has been quite somnolent since presentation to [facility] neuro ICU. Protecting airway in that he is not sonorous, on 2L for comfort and sats are stable in high 90's. He grimaces to painful stimuli, but does not remove noxious stimuli.  Family has been contacted by primary team as well as neuro, pt would not want intubation. Family is clear per report that he is to remain DNR/DNI based on his previously expressed wishes.   CCM asked to consult for med management during the evaluation at this time. MRI pending as well as LTM. All history and information is based on chart review and report from prior physician  Pertinent  Medical History  GBM s/p resection/radiation/temozolomide  T2dm H/o diplopia H/o headache Insomnia H/o HTN  Significant Hospital Events: Including procedures, antibiotic start and stop dates in addition to other pertinent events   Admitted 6/23 to [facility] with ams likely 2/2 GBM s/p  resection Transferred to [facility] for LTM and closer neuro monitoring 2/2 progressive encephalopathy Continues to be somnolent, MRI done: EEG done, sharps in parieto-occipital region.  Interim History / Subjective:  Poorly responsive.  Objective    Blood pressure (!) 128/92, pulse (!) 112, temperature 98.9 F (37.2 C), temperature source Axillary, resp. rate (!) 22, height 6' 1 (1.854 m), weight 90.7 kg, SpO2 97%.        Intake/Output Summary (Last 24 hours) at 11/06/2023 0841 Last data filed at 11/06/2023 0700 Gross per 24 hour  Intake 1306.51 ml  Output 1250 ml  Net 56.51 ml   Filed Weights   10/30/23 1757  Weight: 90.7 kg    Examination: General: nad, somnolent but grimaces to painful stim, laying supine without issue HENT: ncat, perrla, mmmp Lungs: ctab Cardiovascular: rrr Abdomen: soft nt/nd/bs+ Extremities: no c/c/e not mobilizing any 4 extremities on my exam,  Neuro: as above. Overall somnolent and while protecting airway is not purposeful or following commands at this time GU: deferred  Resolved problem list   Assessment and Plan  GBM s/p resection/radiation and temozolomide   -MRI brain shows decreased enhancement surrounding resection cavity. -cont goals of care, pt has previously expressed his desires for DNR/DNI so need to continue with family support and perhaps consider palliative care  Acute onset sz 2/2 above -keppra loaded -conitnuing keppra 1500 bid -make NPO -place feeding tube  Type 2 DM -glargine 24 daily -sliding scale  H/o HTN -normotensive off meds    Best Practice (right click and Reselect all SmartList Selections  daily)   Diet/type: NPO DVT prophylaxis LMWH Pressure ulcer(s): N/A GI prophylaxis: PPI Lines: N/A Foley:  N/A Code Status:  limited Last date of multidisciplinary goals of care discussion [pending. Family at this time is not present]  Labs   CBC: Recent Labs  Lab 10/30/23 1756 11/04/23 0635  WBC 11.2* 13.8*   NEUTROABS 9.2* 11.8*  HGB 15.6 17.0  HCT 44.3 48.8  MCV 88.2 89.2  PLT 251 319    Basic Metabolic Panel: Recent Labs  Lab 10/30/23 1756 11/04/23 0635 11/05/23 0519 11/05/23 1832  NA 134* 132* 129* 130*  K 4.4 5.0 4.5 4.8  CL 103 94* 96* 101  CO2 24 25 18* 19*  GLUCOSE 167* 331* 336* 238*  BUN 19 36* 37* 40*  CREATININE 0.85 1.03 0.81 0.78  CALCIUM 9.7 10.3 10.0 10.1  MG  --  2.5* 2.5*  --    GFR: Estimated Creatinine Clearance: 101.3 mL/min (by C-G formula based on SCr of 0.78 mg/dL). Recent Labs  Lab 10/30/23 1756 11/04/23 0635  WBC 11.2* 13.8*    Liver Function Tests: Recent Labs  Lab 10/30/23 1756 11/04/23 0635 11/05/23 0519 11/05/23 1832  AST 40 14* 12* 10*  ALT 35 25 24 22   ALKPHOS 54 69 64 66  BILITOT 1.1 1.8* 1.6* 1.4*  PROT 7.5 7.7 7.1 7.2  ALBUMIN 4.2 4.3 3.9 3.9   No results for input(s): LIPASE, AMYLASE in the last 168 hours. Recent Labs  Lab 11/04/23 0635  AMMONIA 20    ABG    Component Value Date/Time   PHART 7.54 (H) 11/04/2023 0916   PCO2ART 24 (L) 11/04/2023 0916   PO2ART 100 11/04/2023 0916   HCO3 20.9 11/04/2023 0916   ACIDBASEDEF 0.3 11/04/2023 0916   O2SAT 99.7 11/04/2023 0916     Coagulation Profile: Recent Labs  Lab 10/30/23 1756  INR 1.0    Cardiac Enzymes: No results for input(s): CKTOTAL, CKMB, CKMBINDEX, TROPONINI in the last 168 hours.  HbA1C: Hgb A1c MFr Bld  Date/Time Value Ref Range Status  11/04/2023 06:35 AM 7.5 (H) 4.8 - 5.6 % Final    Comment:    (NOTE) Diagnosis of Diabetes The following HbA1c ranges recommended by the American Diabetes Association (ADA) may be used as an aid in the diagnosis of diabetes mellitus.  Hemoglobin             Suggested A1C NGSP%              Diagnosis  <5.7                   Non Diabetic  5.7-6.4                Pre-Diabetic  >6.4                   Diabetic  <7.0                   Glycemic control for                       adults with  diabetes.    12/19/2018 08:58 AM 7.0 (H) 4.8 - 5.6 % Final    Comment:             Prediabetes: 5.7 - 6.4          Diabetes: >6.4          Glycemic control for adults with diabetes: <7.0  CBG: Recent Labs  Lab 11/05/23 1128 11/05/23 1607 11/05/23 1939 11/05/23 2214 11/05/23 2357  GLUCAP 358* 292* 235* 152* 184*    Review of Systems:   As per HPI  Past Medical History:  He,  has a past medical history of Diabetes (HCC), Diplopia, and Headache.   Surgical History:   Past Surgical History:  Procedure Laterality Date   BUNIONECTOMY       Social History:   reports that he has never smoked. He has never used smokeless tobacco. He reports that he does not drink alcohol and does not use drugs.   Family History:  His family history includes Diabetes in his brother, father, sister, and another family member; Heart disease in his father, mother, and another family member; Stroke in his father.   Allergies No Known Allergies   Home Medications  Prior to Admission medications   Medication Sig Start Date End Date Taking? Authorizing Provider  ascorbic acid (VITAMIN C) 500 MG tablet Take 500 mg by mouth daily.   Yes [provider]  Baclofen  5 MG TABS Take 1 tablet (5 mg total) by mouth every 6 (six) hours as needed (cramping). 10/26/23  Yes Vaslow, Zachary K, MD  clopidogrel  (PLAVIX ) 75 MG tablet Take 1 tablet (75 mg total) by mouth daily. 12/19/18  Yes Onita Duos, MD  dexamethasone  (DECADRON ) 2 MG tablet Take 1 tablet (2 mg total) by mouth daily. 10/19/23  Yes Vaslow, Zachary K, MD  glimepiride  (AMARYL ) 4 MG tablet Take 1 tablet (4 mg total) by mouth daily before breakfast. 06/17/16  Yes Kassie Mallick, MD  insulin  lispro (HUMALOG) 100 UNIT/ML KwikPen Inject 0-8 Units into the skin 3 (three) times daily as needed (bg over 200). 07/07/23  Yes [provider]  lisinopril  (ZESTRIL ) 5 MG tablet Take 5 mg by mouth daily.   Yes [provider]  metFORMIN  (GLUCOPHAGE-XR) 500 MG 24 hr tablet Take 1,000 mg by mouth 2 (two) times daily with a meal.   Yes [provider]  ondansetron  (ZOFRAN ) 8 MG tablet Take 1 tablet (8 mg total) by mouth every 8 (eight) hours as needed for nausea or vomiting. May take 30-60 minutes prior to Temodar  administration if nausea/vomiting occurs as needed. 10/19/23  Yes Vaslow, Zachary K, MD  QUEtiapine  (SEROQUEL ) 50 MG tablet Take 1 tablet (50 mg total) by mouth at bedtime. 10/24/23  Yes Vaslow, Zachary K, MD  Sennosides (SENOKOT PO) Take 1 tablet by mouth daily as needed.   Yes [provider]  temozolomide  (TEMODAR ) 140 MG capsule Take 1 capsule (140 mg total) by mouth daily. (Take with ONE 180 mg capsule for total daily dose of 320 mg). Take for 5 days on then 23 days off. May take on an empty stomach to decrease nausea & vomiting. 10/19/23  Yes Vaslow, Zachary K, MD  temozolomide  (TEMODAR ) 180 MG capsule Take 1 capsule (180 mg total) by mouth daily. (Take with ONE 140 mg capsule for a total daily dose of 320 mg). Take for 5 days on then 23 days off. May take on an empty stomach to decrease nausea & vomiting. 10/19/23  Yes Vaslow, Zachary K, MD  VICTOZA  18 MG/3ML SOPN INJECT 0.3 ML'S (1.8 MG TOTAL) INTO THE SKIN DAILY Patient taking differently: Inject 1.8 mg into the skin daily at 12 noon. 01/16/17  Yes Kassie Mallick, MD     Critical care time:     Kevin Legions, MD PCCM

## 2023-11-06 NOTE — Progress Notes (Signed)
 PT Cancellation Note  Patient Details Name: Javier Graham MRN: [id number] DOB: [birth year]   Cancelled Treatment:    Reason Eval/Treat Not Completed: Patient not medically ready - pt not following commands, is somnolent. PT to check back tomorrow.   Kinsleigh Ludolph S, PT DPT Acute Rehabilitation Services Secure Chat Preferred  Office [phone]    Ranyah Groeneveld E Stroup 11/06/2023, 4:13 PM

## 2023-11-06 NOTE — Progress Notes (Signed)
LTM EEG D/C'd. No skin break down noted. [facility] notified.

## 2023-11-06 NOTE — Plan of Care (Signed)
  Problem: Pain Managment: Goal: General experience of comfort will improve and/or be controlled Outcome: Progressing   Problem: Education: Goal: Knowledge of General Education information will improve Description: Including pain rating scale, medication(s)/side effects and non-pharmacologic comfort measures Outcome: Not Progressing   Problem: Activity: Goal: Risk for activity intolerance will decrease Outcome: Not Progressing   Problem: Nutrition: Goal: Adequate nutrition will be maintained Outcome: Not Progressing

## 2023-11-06 NOTE — Progress Notes (Signed)
 Subjective: No acute events overnight.  Patient's sister, wife and son at bedside deny any further seizure-like episodes.   ROS: Unable to obtain due to poor mental status  Examination  Vital signs in last 24 hours: Temp:  [97.6 F (36.4 C)-100.3 F (37.9 C)] 99.8 F (37.7 C) (06/30 1156) Pulse Rate:  [94-121] 113 (06/30 1100) Resp:  [17-32] 23 (06/30 1100) BP: (103-143)/(72-105) 138/96 (06/30 1100) SpO2:  [94 %-100 %] 98 % (06/30 1100)  General: lying in bed, NAD Neuro: Comatose, does not open eyes to repeated tactile stimulation, does not follow commands, does not answer mentation questions, pupils equally round and reactive to light, no forced gaze deviation, not spontaneously moving any extremities and did not apply any noxious stimulation due to family's desire for hospice most likely  Basic Metabolic Panel: Recent Labs  Lab 10/30/23 1756 11/04/23 0635 11/05/23 0519 11/05/23 1832  NA 134* 132* 129* 130*  K 4.4 5.0 4.5 4.8  CL 103 94* 96* 101  CO2 24 25 18* 19*  GLUCOSE 167* 331* 336* 238*  BUN 19 36* 37* 40*  CREATININE 0.85 1.03 0.81 0.78  CALCIUM 9.7 10.3 10.0 10.1  MG  --  2.5* 2.5*  --     CBC: Recent Labs  Lab 10/30/23 1756 11/04/23 0635  WBC 11.2* 13.8*  NEUTROABS 9.2* 11.8*  HGB 15.6 17.0  HCT 44.3 48.8  MCV 88.2 89.2  PLT 251 319     Coagulation Studies: No results for input(s): LABPROT, INR in the last 72 hours.  Imaging Personally reviewed  MRI brain with and without contrast 11/05/2023:  In comparison to recent MRI, similar resection cavity. In comparison to outside May 29 MRI with contrast, mildly decreased enhancement and mildly decreased expansile T2 hyperintensity surrounding the resection cavity and anterior to the resection cavity which likely represents residual tumor.  No substantial change in T2 hyperintense signal in the right frontal lobe with mild enhancement, potentially a second focus of glioma. Stable planum sphenoidale  meningioma.  ASSESSMENT AND PLAN: 68 year old male with GBM status post chemoradiation who presented with focal status epilepticus which has since resolved.  Focal status epilepticus, resolved - Etiology: Likely due to underlying tumor -Continue Keppra 1500 mg twice daily.  If patient remains excessively drowsy and seizures do not recur, can consider lowering to 1000 mg twice daily tomorrow -If seizures recur, can add Vimpat -Discussed with family.  It appears that patient has had worsening quality of life prior to admission and had expressed desire about not wanting life-prolonging measures.  Patient's wife has also been struggling to take care of him at home in the recent past due to significant deterioration in his physical and mental capacities - DC LTM EEG as goal is to treat clinical seizures from here on  - Palliative care team has seen the patient, appreciate their help - Dr. Buckley will be stopping by after his clinic this afternoon - Discussed plan with Dr. Volanda via secure chat  I have spent a total of  36  minutes with the patient reviewing hospital notes,  test results, labs and examining the patient as well as establishing an assessment and plan that was discussed personally with the patient's family at bedside.  > 50% of time was spent in direct patient care.        Javier Graham Epilepsy [facility] For questions after 5pm please refer to AMION to reach the Neurologist on call

## 2023-11-06 NOTE — Consult Note (Signed)
 [facility] Neuro-Oncology Progress Note  Patient Care Team: Burdine, Elspeth BRAVO, MD as PCP - Diedre Alix Charleston, MD as Consulting Physician (Neurosurgery)  CHIEF COMPLAINTS/PURPOSE OF CONSULTATION:  Glioblastoma Status Epilepticus  HISTORY OF PRESENTING ILLNESS:  Javier Graham 68 y.o. male presented with new onset seizures, status epilepticus, transferred from [facility].  He his resting at this time, off EEG.  Family has not noticed any convulsions or other seizure like activity.    MEDICAL HISTORY:  Past Medical History:  Diagnosis Date   Diabetes (HCC)    Diplopia    Headache     SURGICAL HISTORY: Past Surgical History:  Procedure Laterality Date   BUNIONECTOMY      SOCIAL HISTORY: Social History   Socioeconomic History   Marital status: Married    Spouse name: Not on file   Number of children: 3   Years of education: college   Highest education level: Master's degree (e.g., MA, MS, MEng, MEd, MSW, MBA)  Occupational History   Not on file  Tobacco Use   Smoking status: Never   Smokeless tobacco: Never  Substance and Sexual Activity   Alcohol use: No    Comment: none since 1996   Drug use: No   Sexual activity: Not on file  Other Topics Concern   Not on file  Social History Narrative   Lives at home with his wife.   Right-handed.   One can of Coke Zero per day.   Social Drivers of Corporate investment banker Strain: High Risk (07/10/2023)   Received from [facility]   Overall Financial Resource Strain (CARDIA)    Difficulty of Paying Living Expenses: Hard  Food Insecurity: Patient Unable To Answer (10/31/2023)   Hunger Vital Sign    Worried About Running Out of Food in the Last Year: Patient unable to answer    Ran Out of Food in the Last Year: Patient unable to answer  Transportation Needs: Patient Unable To Answer (10/31/2023)   PRAPARE - Transportation    Lack of Transportation (Medical): Patient unable to answer     Lack of Transportation (Non-Medical): Patient unable to answer  Physical Activity: Not on file  Stress: Not on file  Social Connections: Unknown (10/31/2023)   Social Connection and Isolation Panel    Frequency of Communication with Friends and Family: Patient unable to answer    Frequency of Social Gatherings with Friends and Family: Patient unable to answer    Attends Religious Services: Patient unable to answer    Active Member of Clubs or Organizations: Patient unable to answer    Attends Banker Meetings: Patient unable to answer    Marital Status: Married  Catering manager Violence: Patient Unable To Answer (10/31/2023)   Humiliation, Afraid, Rape, and Kick questionnaire    Fear of Current or Ex-Partner: Patient unable to answer    Emotionally Abused: Patient unable to answer    Physically Abused: Patient unable to answer    Sexually Abused: Patient unable to answer    FAMILY HISTORY: Family History  Problem Relation Age of Onset   Heart disease Other    Diabetes Other    Diabetes Father    Stroke Father        x 2   Heart disease Father    Diabetes Sister    Diabetes Brother    Heart disease Mother     ALLERGIES:  has no known allergies.  MEDICATIONS:  Current Facility-Administered Medications  Medication Dose Route Frequency Provider Last Rate Last Admin   acetaminophen  (TYLENOL ) tablet 650 mg  650 mg Oral Q6H PRN Dena Charleston, MD       Or   acetaminophen  (TYLENOL ) suppository 650 mg  650 mg Rectal Q6H PRN Dorrell, Robert, MD       Chlorhexidine Gluconate Cloth 2 % PADS 6 each  6 each Topical Daily Bhagat, Srishti L, MD   6 each at 11/06/23 0949   dexamethasone  (DECADRON ) injection 8 mg  8 mg Intravenous Q8H Dorrell, Robert, MD   8 mg at 11/06/23 1431   enoxaparin  (LOVENOX ) injection 40 mg  40 mg Subcutaneous Q24H Dena Charleston, MD   40 mg at 11/06/23 0949   hydrALAZINE (APRESOLINE) injection 10 mg  10 mg Intravenous Q6H PRN Shalhoub, George J, MD        insulin  aspart (novoLOG ) injection 0-20 Units  0-20 Units Subcutaneous Q4H Joshi, Rutwij, MD   7 Units at 11/06/23 1630   [START ON 11/07/2023] insulin  glargine-yfgn (SEMGLEE) injection 28 Units  28 Units Subcutaneous Daily Joshi, Rutwij, MD       levETIRAcetam (KEPPRA) undiluted injection 1,500 mg  1,500 mg Intravenous Q12H Bhagat, Srishti L, MD   1,500 mg at 11/06/23 0827   ondansetron  (ZOFRAN ) tablet 4 mg  4 mg Oral Q6H PRN Dena Charleston, MD       Or   ondansetron  (ZOFRAN ) injection 4 mg  4 mg Intravenous Q6H PRN Dena Charleston, MD   4 mg at 11/03/23 1716   Oral care mouth rinse  15 mL Mouth Rinse PRN Bhagat, Srishti L, MD       pantoprazole (PROTONIX) injection 40 mg  40 mg Intravenous Q24H Kenard Caven Perine PARAS, MD   40 mg at 11/05/23 2202    REVIEW OF SYSTEMS:   Limited by AMS   PHYSICAL EXAMINATION: Vitals:   11/06/23 1500 11/06/23 1600  BP: (!) 132/98 (!) 128/97  Pulse: (!) 124 (!) 123  Resp: (!) 27 (!) 27  Temp:  98.9 F (37.2 C)  SpO2: 96% 95%   KPS: 50. General: Not awake Head: Normal EENT: No conjunctival injection or scleral icterus. Oral mucosa moist Lungs: Resp effort normal Cardiac: Regular rate and rhythm Abdomen: Soft, non-distended abdomen Skin: No rashes cyanosis or petechiae. Extremities: No clubbing or edema  NEUROLOGIC EXAM: Eyes closed, arouses to noxious stim.  Minimally conscious.  Withdraws both sides.     LABORATORY DATA:  I have reviewed the data as listed Lab Results  Component Value Date   WBC 13.8 (H) 11/04/2023   HGB 17.0 11/04/2023   HCT 48.8 11/04/2023   MCV 89.2 11/04/2023   PLT 319 11/04/2023   Recent Labs    11/04/23 0635 11/05/23 0519 11/05/23 1832  NA 132* 129* 130*  K 5.0 4.5 4.8  CL 94* 96* 101  CO2 25 18* 19*  GLUCOSE 331* 336* 238*  BUN 36* 37* 40*  CREATININE 1.03 0.81 0.78  CALCIUM 10.3 10.0 10.1  GFRNONAA >60 >60 >60  PROT 7.7 7.1 7.2  ALBUMIN 4.3 3.9 3.9  AST 14* 12* 10*  ALT 25 24 22   ALKPHOS 69 64  66  BILITOT 1.8* 1.6* 1.4*    RADIOGRAPHIC STUDIES: I have personally reviewed the radiological images as listed and agreed with the findings in the report. Overnight EEG with video Result Date: 11/06/2023 Shelton Arlin KIDD, MD     11/06/2023  8:39 AM Patient Name: Javier Graham MRN: [id number] Epilepsy Attending: Arlin KIDD  Shelton Referring Physician/Provider: Jerrie Lola CROME, MD Duration: 11/05/2023 2316 to 11/06/2023 0825 Patient history: 68 y.o. man with history of glioblastoma s/p resection, radiation, and temozolomide  admitted on 6/23 for evaluation of general decline in function and difficulty communicating with family. On Sunday 6/29 of jaw twitching around 11 AM that has continued despite a 3000 mg IV loading dose of Keppra; resolved after Ativan and additional 1500 mg Keppra. EEG to evaluate for seizure. Level of alertness: Awake, asleep AEDs during EEG study: LEV Technical aspects: This EEG study was done with scalp electrodes positioned according to the 10-20 International system of electrode placement. Electrical activity was reviewed with band pass filter of 1-70Hz , sensitivity of 7 uV/mm, display speed of 66mm/sec with a 60Hz  notched filter applied as appropriate. EEG data were recorded continuously and digitally stored.  Video monitoring was available and reviewed as appropriate. Description: During awake state, no clear posterior dominant rhythm was seen.  Sleep was characterized by sleep spindles (12 to 14 Hz), maximal frontocentral region.  EEG showed continuous generalized and maximal right parieto-occipital region 3 to 6 Hz theta-delta slowing.  Sharp waves were also noted in right parieto-occipital region.  Hyperventilation and photic stimulation were not performed.   EEG was disconnected between 11/05/2023 2355 to 11/06/2023 0040 for MRI brain. ABNORMALITY -Sharp wave, right parieto-occipital region - Continuous slow, generalized and maximal right parieto-occipital region IMPRESSION: This  study showed evidence of epileptogenicity and cortical dysfunction arising from right parieto-occipital region.  Additionally there is moderate to severe diffuse encephalopathy.  No definite seizures were noted. Arlin MALVA Shelton   MR BRAIN W WO CONTRAST Result Date: 11/06/2023 CLINICAL DATA:  Seizure, new-onset, no history of trauma EXAM: MRI HEAD WITHOUT AND WITH CONTRAST TECHNIQUE: Multiplanar, multiecho pulse sequences of the brain and surrounding structures were obtained without and with intravenous contrast. CONTRAST:  9mL GADAVIST  GADOBUTROL  1 MMOL/ML IV SOLN COMPARISON:  Noncontrast MRI head June 24, 25. Outside MRI with contrast from May 29 25. FINDINGS: Brain: Postoperative changes of right parietal craniotomy with subjacent resection cavity, similar to recent MRI head and similar versus slightly decreased in size in comparison to May 29 25. In comparison to May postcontrast study, peripheral enhancement surrounding the cavity is mildly improved in there is also mild decrease in enhancement along the anterior aspect of the postop cavity. Persistent surrounding a mildly expansile T2/FLAIR hyperintensity. Additional site of mildly expansile T2 hyperintensity in the right frontal parenchyma and persistent enhancement in the inferior right frontal lobe (series 16, image 32). Similar enhancing 2 cm extra-axial dural-based mass along the planum sphenoidale, compatible with meningioma. No evidence of acute infarct, midline shift, or hydrocephalus. Similar cerebral atrophy and ex vacuo ventricular dilation. Vascular: Major arterial flow voids are maintained. Skull and upper cervical spine: Status post right posterior craniotomy. No other marrow signal abnormality. Sinuses/Orbits: Right maxillary sinus retention cyst. No acute orbital findings. Other: No mastoid effusions. IMPRESSION: 1. In comparison to recent MRI, similar resection cavity. In comparison to outside May 29 MRI with contrast, mildly decreased  enhancement and mildly decreased expansile T2 hyperintensity surrounding the resection cavity and anterior to the resection cavity which likely represents residual tumor. 2. No substantial change in T2 hyperintense signal in the right frontal lobe with mild enhancement, potentially a second focus of glioma. 3. Stable planum sphenoidale meningioma. Electronically Signed   By: Gilmore GORMAN Molt M.D.   On: 11/06/2023 02:05   MR BRAIN WO CONTRAST Result Date: 10/31/2023 CLINICAL DATA:  Altered mental status. EXAM:  MRI HEAD WITHOUT CONTRAST TECHNIQUE: Multiplanar, multiecho pulse sequences of the brain and surrounding structures were obtained without intravenous contrast. COMPARISON:  CT of the head dated October 30, 2023 and MRI brain dated Oct 05, 2023. FINDINGS: Brain: The patient is status post right posterior craniotomy for resection of lesion present posterior medially within the right parietal lobe. There is a complex cystic surgical resection cavity again demonstrated measuring approximately 3.2 cm in AP diameter, 2.5 cm in transverse diameter and 3.3 cm in craniocaudad length. There is persistent residual elevated T2 signal within the surrounding white matter, which appears similar to the prior MRI. The current study was performed without the benefit of intravenous contrast. A focal area of increased T2 signal is also again demonstrated within the right frontal lobe, which appears unchanged. There is moderate generalized cerebral volume loss and mild to moderate periventricular white matter disease. A meningioma of the planum sphenoidale is also again demonstrated, measuring approximately 2.3 x 2.0 x 0.9 cm. Vascular: Normal vascular flow voids. Skull and upper cervical spine: Status post right posterior craniotomy. Normal marrow signal otherwise. Sinuses/Orbits: Opacification within the right maxillary sinus. The orbits are unremarkable. Other: None. IMPRESSION: 1. Status post right posterior craniotomy for  resection of a lesion within the right posteromedial parietal lobe. There is no significant interval change in the appearance of the surgical resection cavity on this noncontrast MRI of the brain. 2. Stable lesion along the planum sphenoidale, compatible with meningioma. Electronically Signed   By: Evalene Coho M.D.   On: 10/31/2023 13:02   CT Head Wo Contrast Result Date: 10/30/2023 CLINICAL DATA:  Initial evaluation for acute delirium. EXAM: CT HEAD WITHOUT CONTRAST TECHNIQUE: Contiguous axial images were obtained from the base of the skull through the vertex without intravenous contrast. RADIATION DOSE REDUCTION: This exam was performed according to the departmental dose-optimization program which includes automated exposure control, adjustment of the mA and/or kV according to patient size and/or use of iterative reconstruction technique. COMPARISON:  Prior MRI from 10/16/2023 FINDINGS: Brain: Postoperative changes from prior right posterior craniotomy. Heterogeneous hypodensity with cystic change within the underlying right parieto-occipital region, consistent with known history of GBM. Irregular involvement of the anterior right frontal cortex, better appreciated on prior brain MRI. 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 No acute intracranial hemorrhage. No acute large vessel territory infarct. No other visible mass lesion. No significant mass effect or midline shift. Stable ventricular size without hydrocephalus. No extra-axial fluid collection. Vascular: No abnormal hyperdense vessel. Skull: Scalp soft tissues demonstrate no acute finding. Prior right posterior craniotomy. Calvarium otherwise intact. Sinuses/Orbits: Globes and orbital soft tissues within normal limits. Paranasal sinuses are largely clear. No significant mastoid effusion. Other: None. IMPRESSION: 1. No acute intracranial abnormality. 2. Postoperative changes from prior right posterior craniotomy  with underlying right parieto-occipital GBM, better appreciated on prior brain MRI. No significant mass effect or midline shift. 3. 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 Electronically Signed   By: Morene Hoard M.D.   On: 10/30/2023 20:56   DG Chest Portable 1 View Result Date: 10/30/2023 CLINICAL DATA:  Altered mental status. EXAM: PORTABLE CHEST 1 VIEW COMPARISON:  None Available. FINDINGS: The heart size and mediastinal contours are within normal limits. Low lung volumes are noted. Both lungs are clear. The visualized skeletal structures are unremarkable. IMPRESSION: Low lung volumes without active cardiopulmonary disease. Electronically Signed   By: Suzen Dials M.D.   On: 10/30/2023 18:53    ASSESSMENT &  PLAN:  Glioblastoma Status Epilepticus  Javier Graham is severely encephalopathic in post-ictal state.  As usual, we agree with management tactics from inpatient neurology and epilepsy teams.  Encouraged by limited up-titration of AEDs to break the status pattern.  We engaged in prolonged goals of care discussion with family.  They understand his post-ictal state is likely to improve gradually over the coming days.  There is also an impulse to transition to hospice or comfort care, given the ultimate lack of curative options for his underlying tumor, which is stable over the near term.  Both of these approaches are reasonable medically and personally.  Family will meet with palliative care tomorrow to continue goals of care discussion.  We will remain available to primary and consulting teams for additional updates, questions, or guidance.  All questions were answered. The patient knows to call the clinic with any problems, questions or concerns.  The total time spent in the encounter was 60 minutes and more than 50% was on counseling and review of test results     Arthea MARLA Manns, MD 11/06/2023 5:00 PM

## 2023-11-06 NOTE — Consult Note (Signed)
 Consultation Note Date: 11/06/2023   Patient Name: Javier Graham  DOB: Nov 28, 1955  MRN: [id number]  Age / Sex: 68 y.o., male  PCP: Lari Elspeth BRAVO, MD Referring Physician: Layman Raisin, DO  Reason for Consultation: Establishing goals of care  HPI/Patient Profile: 68 y.o. male  with past medical history of glioblastoma s/p resection Feb 2025 and radiation with initiation of temozolomide , HTN, DM2, insomnia admitted on 10/30/2023 with general decline and difficulty with communication and jaw twitching being treated for seizure activity in the setting of GBM recurrence vs inflammation resulting from radiation therapy.   Clinical Assessment and Goals of Care: Consult received and chart review completed. I met today at [facility] bedside. He is unresponsive. He has no signs of distress and appears to be resting comfortably. I met with wife, Javier Graham, and sister, Javier Graham, at bedside. Javier Graham is understandably tearful. We are awaiting further conversation when Javier son (also Javier Graham) arrives to bedside. Javier Graham shares that Nichoals and Javier Graham moved here from [place] ~19 years ago. 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 Javier Graham has a daughter who is local. Tian has 2 sons both currently living in [place] but one currently in town. 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   I notified Dr. Buckley of current status and plans for palliative discussion. He plans to visit this afternoon.   I sat down with Javier Graham and son Javier Graham. We reviewed his current status and seizure activity. We discussed the question of how much he may be able to improve if seizures are better controlled and what this would mean for his quality of life. Family share that he has had gradual decline since his surgical resection with escalating decline since May/June. Javier Graham shows me photos and videos on her phone of activity at  home. She has been taking care of him and trying to keep him safe at home. Family report that Jobani has been clear about his desire for quality of life and his desire for his life not to be prolonged.   Family share that they were asked about Cortrak and they both agree that this is not something that he would want. We discussed paths from here with continuing to manage seizures, treat symptoms as needed to minimize suffering, and continue discussions with Dr. Buckley and team to best determine what Beverly would want. We did discuss comfort care and hospice options. We discussed the importance to consider if Jori from a year ago were sitting and listening/seeing what is going on what would he say to do. Family are leaning towards more comfort/hospice path. They also acknowledge that a transition to comfort now may save him from more suffering in the future. I reassured them there is no rush to make this decision and we want them to feel that whatever decision moving forward is going to be best for Javier Graham. We have a direction now to continue current care, no escalation, and continue conversations.   All questions/concerns addressed. Emotional  support provided.   Primary Decision Maker NEXT OF KIN wife Javier Graham    SUMMARY OF RECOMMENDATIONS   - DNR/DNI in place - Continue current care - Do not escalate care (no Cortrak) - We will meet again in the morning ~11 am  Code Status/Advance Care Planning: DNR   Symptom Management:  Provide PRN medications as needed to minimize suffering and treat any symptoms of discomfort.   Prognosis:  Prognosis poor.   Discharge Planning: To Be Determined      Primary Diagnoses: Present on Admission:  Encephalopathy acute  Glioblastoma, IDH-wildtype (HCC)   I have reviewed the medical record, interviewed the patient and family, and examined the patient. The following aspects are pertinent.  Past Medical History:  Diagnosis Date   Diabetes (HCC)    Diplopia     Headache    Social History   Socioeconomic History   Marital status: Married    Spouse name: Not on file   Number of children: 3   Years of education: college   Highest education level: Master's degree (e.g., MA, MS, MEng, MEd, MSW, MBA)  Occupational History   Not on file  Tobacco Use   Smoking status: Never   Smokeless tobacco: Never  Substance and Sexual Activity   Alcohol use: No    Comment: none since 1996   Drug use: No   Sexual activity: Not on file  Other Topics Concern   Not on file  Social History Narrative   Lives at home with his wife.   Right-handed.   One can of Coke Zero per day.   Social Drivers of Corporate investment banker Strain: High Risk (07/10/2023)   Received from [facility]   Overall Financial Resource Strain (CARDIA)    Difficulty of Paying Living Expenses: Hard  Food Insecurity: Patient Unable To Answer (10/31/2023)   Hunger Vital Sign    Worried About Running Out of Food in the Last Year: Patient unable to answer    Ran Out of Food in the Last Year: Patient unable to answer  Transportation Needs: Patient Unable To Answer (10/31/2023)   PRAPARE - Transportation    Lack of Transportation (Medical): Patient unable to answer    Lack of Transportation (Non-Medical): Patient unable to answer  Physical Activity: Not on file  Stress: Not on file  Social Connections: Unknown (10/31/2023)   Social Connection and Isolation Panel    Frequency of Communication with Friends and Family: Patient unable to answer    Frequency of Social Gatherings with Friends and Family: Patient unable to answer    Attends Religious Services: Patient unable to answer    Active Member of Clubs or Organizations: Patient unable to answer    Attends Banker Meetings: Patient unable to answer    Marital Status: Married   Family History  Problem Relation Age of Onset   Heart disease Other    Diabetes Other    Diabetes Father    Stroke Father         x 2   Heart disease Father    Diabetes Sister    Diabetes Brother    Heart disease Mother    Scheduled Meds:  Chlorhexidine Gluconate Cloth  6 each Topical Daily   dexamethasone  (DECADRON ) injection  8 mg Intravenous Q8H   enoxaparin  (LOVENOX ) injection  40 mg Subcutaneous Q24H   insulin  aspart  0-20 Units Subcutaneous Q6H   insulin  aspart  7 Units Subcutaneous Q6H   insulin   glargine-yfgn  24 Units Subcutaneous Daily   levETIRAcetam  1,500 mg Intravenous Q12H   pantoprazole (PROTONIX) IV  40 mg Intravenous Q24H   Continuous Infusions:  sodium chloride  20 mL/hr at 11/05/23 1317   lactated ringers 1,000 mL with potassium chloride 20 mEq infusion 75 mL/hr at 11/06/23 0832   PRN Meds:.sodium chloride , acetaminophen  **OR** acetaminophen , hydrALAZINE, ondansetron  **OR** ondansetron  (ZOFRAN ) IV, mouth rinse No Known Allergies Review of Systems  Unable to perform ROS: Acuity of condition    Physical Exam Vitals and nursing note reviewed.  Constitutional:      General: He is not in acute distress.  Cardiovascular:     Rate and Rhythm: Tachycardia present.  Pulmonary:     Effort: No tachypnea, accessory muscle usage or respiratory distress.  Abdominal:     General: Abdomen is flat.     Palpations: Abdomen is soft.   Neurological:     Mental Status: He is unresponsive.     Vital Signs: BP (!) 130/100 (BP Location: Right Arm)   Pulse (!) 113   Temp 100.3 F (37.9 C) (Axillary)   Resp (!) 23   Ht 6' 1 (1.854 m)   Wt 90.7 kg   SpO2 98%   BMI 26.39 kg/m  Pain Scale: CPOT   Pain Score: 0-No pain   SpO2: SpO2: 98 % O2 Device:SpO2: 98 % O2 Flow Rate: .O2 Flow Rate (L/min): 2 L/min  IO: Intake/output summary:  Intake/Output Summary (Last 24 hours) at 11/06/2023 0948 Last data filed at 11/06/2023 0800 Gross per 24 hour  Intake 1377.79 ml  Output 1250 ml  Net 127.79 ml    LBM: Last BM Date :  (PTA) Baseline Weight: Weight: 90.7 kg Most recent weight: Weight:  90.7 kg     Palliative Assessment/Data:    Time Total: 90 min  Greater than 50%  of this time was spent counseling and coordinating care related to the above assessment and plan.  Signed by: Bernarda Kitty, NP Palliative Medicine Team Pager # [phone] (M-F 8a-5p) Team Phone # [phone] (Nights/Weekends)

## 2023-11-06 NOTE — Inpatient Diabetes Management (Signed)
 Inpatient Diabetes Program Recommendations  AACE/ADA: New Consensus Statement on Inpatient Glycemic Control (2015)  Target Ranges:  Prepandial:   less than 140 mg/dL      Peak postprandial:   less than 180 mg/dL (1-2 hours)      Critically ill patients:  140 - 180 mg/dL   Lab Results  Component Value Date   GLUCAP 170 (H) 11/06/2023   HGBA1C 7.5 (H) 11/04/2023    Review of Glycemic Control  Latest Reference Range & Units 11/05/23 07:36 11/05/23 11:28 11/05/23 16:07 11/05/23 19:39 11/05/23 22:14 11/05/23 23:57 11/06/23 11:45  Glucose-Capillary 70 - 99 mg/dL 645 (H) 641 (H) 707 (H) 235 (H) 152 (H) 184 (H) 170 (H)  (H): Data is abnormally high  Diabetes history: DM2 Outpatient Diabetes medications: Amaryl  4 mg every day, Humalog 0-8 units TID, Metformin XR 500 mg BID, Victoza  1.8 mg weekly Current orders for Inpatient glycemic control: Semglee 24 units every day, Novolog  0-20 units Q6H, Novolog  7 units Q6H  Inpatient Diabetes Program Recommendations:    Not receiving tube feeds or TPN.  If appropriate, please consider discontinuing Novolog  7 units Q6H.    Might also consider Novolog  0-20 units Q4H  Thank you, Wyvonna Pinal, MSN, CDCES Diabetes Coordinator Inpatient Diabetes Program [phone] (team pager from 8a-5p)

## 2023-11-07 ENCOUNTER — Ambulatory Visit (HOSPITAL_COMMUNITY)

## 2023-11-07 ENCOUNTER — Inpatient Hospital Stay (HOSPITAL_COMMUNITY)

## 2023-11-07 DIAGNOSIS — D496 Neoplasm of unspecified behavior of brain: Secondary | ICD-10-CM | POA: Diagnosis not present

## 2023-11-07 DIAGNOSIS — G40909 Epilepsy, unspecified, not intractable, without status epilepticus: Secondary | ICD-10-CM | POA: Diagnosis not present

## 2023-11-07 DIAGNOSIS — Z515 Encounter for palliative care: Secondary | ICD-10-CM | POA: Diagnosis not present

## 2023-11-07 DIAGNOSIS — G40901 Epilepsy, unspecified, not intractable, with status epilepticus: Secondary | ICD-10-CM | POA: Diagnosis not present

## 2023-11-07 DIAGNOSIS — Z7189 Other specified counseling: Secondary | ICD-10-CM | POA: Diagnosis not present

## 2023-11-07 DIAGNOSIS — C719 Malignant neoplasm of brain, unspecified: Secondary | ICD-10-CM | POA: Diagnosis not present

## 2023-11-07 LAB — CBC WITH DIFFERENTIAL/PLATELET
Abs Immature Granulocytes: 0.07 10*3/uL (ref 0.00–0.07)
Basophils Absolute: 0 10*3/uL (ref 0.0–0.1)
Basophils Relative: 0 %
Eosinophils Absolute: 0 10*3/uL (ref 0.0–0.5)
Eosinophils Relative: 0 %
HCT: 45.1 % (ref 39.0–52.0)
Hemoglobin: 15.6 g/dL (ref 13.0–17.0)
Immature Granulocytes: 1 %
Lymphocytes Relative: 2 %
Lymphs Abs: 0.2 10*3/uL — ABNORMAL LOW (ref 0.7–4.0)
MCH: 31.6 pg (ref 26.0–34.0)
MCHC: 34.6 g/dL (ref 30.0–36.0)
MCV: 91.5 fL (ref 80.0–100.0)
Monocytes Absolute: 0.8 10*3/uL (ref 0.1–1.0)
Monocytes Relative: 6 %
Neutro Abs: 11.4 10*3/uL — ABNORMAL HIGH (ref 1.7–7.7)
Neutrophils Relative %: 91 %
Platelets: 189 10*3/uL (ref 150–400)
RBC: 4.93 MIL/uL (ref 4.22–5.81)
RDW: 12.3 % (ref 11.5–15.5)
Smear Review: NORMAL
WBC: 12.5 10*3/uL — ABNORMAL HIGH (ref 4.0–10.5)
nRBC: 0 % (ref 0.0–0.2)

## 2023-11-07 LAB — BASIC METABOLIC PANEL WITH GFR
Anion gap: 15 (ref 5–15)
BUN: 56 mg/dL — ABNORMAL HIGH (ref 8–23)
CO2: 18 mmol/L — ABNORMAL LOW (ref 22–32)
Calcium: 10.8 mg/dL — ABNORMAL HIGH (ref 8.9–10.3)
Chloride: 104 mmol/L (ref 98–111)
Creatinine, Ser: 2.44 mg/dL — ABNORMAL HIGH (ref 0.61–1.24)
GFR, Estimated: 28 mL/min — ABNORMAL LOW (ref >60)
Glucose, Bld: 325 mg/dL — ABNORMAL HIGH (ref 70–99)
Potassium: 5.2 mmol/L — ABNORMAL HIGH (ref 3.5–5.1)
Sodium: 137 mmol/L (ref 135–145)

## 2023-11-07 LAB — LACTIC ACID, PLASMA
Lactic Acid, Venous: 4.4 mmol/L (ref 0.5–1.9)
Lactic Acid, Venous: 3.5 mmol/L (ref 0.5–1.9)

## 2023-11-07 LAB — GLUCOSE, CAPILLARY
Glucose-Capillary: 323 mg/dL — ABNORMAL HIGH (ref 70–99)
Glucose-Capillary: 263 mg/dL — ABNORMAL HIGH (ref 70–99)

## 2023-11-07 MED ORDER — LACTATED RINGERS IV BOLUS
1000.0000 mL | Freq: Once | INTRAVENOUS | Status: AC
  Administered 2023-11-07: 1000 mL via INTRAVENOUS

## 2023-11-07 MED ORDER — VANCOMYCIN HCL 1750 MG/350ML IV SOLN
1750.0000 mg | Freq: Once | INTRAVENOUS | Status: DC
  Filled 2023-11-07: qty 350

## 2023-11-07 MED ORDER — BIOTENE DRY MOUTH MT LIQD: 15.0000 mL | OROMUCOSAL | Status: DC | PRN

## 2023-11-07 MED ORDER — POLYVINYL ALCOHOL 1.4 % OP SOLN: 1.0000 [drp] | Freq: Four times a day (QID) | OPHTHALMIC | Status: DC | PRN

## 2023-11-07 MED ORDER — PIPERACILLIN-TAZOBACTAM 3.375 G IVPB
3.3750 g | Freq: Three times a day (TID) | INTRAVENOUS | Status: DC
  Administered 2023-11-07: 3.375 g via INTRAVENOUS
  Filled 2023-11-07: qty 50

## 2023-11-07 MED ORDER — GLYCOPYRROLATE 0.2 MG/ML IJ SOLN: 0.2000 mg | INTRAMUSCULAR | Status: DC | PRN

## 2023-11-07 MED ORDER — HYDROMORPHONE HCL 1 MG/ML IJ SOLN: 1.0000 mg | INTRAMUSCULAR | Status: DC | PRN

## 2023-11-07 MED ORDER — LORAZEPAM 2 MG/ML IJ SOLN: 1.0000 mg | INTRAMUSCULAR | Status: DC | PRN

## 2023-11-07 MED ORDER — LACTATED RINGERS IV BOLUS
500.0000 mL | Freq: Once | INTRAVENOUS | Status: AC
  Administered 2023-11-07: 500 mL via INTRAVENOUS

## 2023-11-07 MED ORDER — LEVETIRACETAM (KEPPRA) 500 MG/5 ML ADULT IV PUSH
750.0000 mg | Freq: Two times a day (BID) | INTRAVENOUS | Status: DC
  Administered 2023-11-07 – 2023-11-08 (×2): 750 mg via INTRAVENOUS
  Filled 2023-11-07 (×2): qty 10

## 2023-11-07 MED ORDER — GLYCOPYRROLATE 1 MG PO TABS: 1.0000 mg | ORAL_TABLET | ORAL | Status: DC | PRN

## 2023-11-07 MED ORDER — VANCOMYCIN VARIABLE DOSE PER UNSTABLE RENAL FUNCTION (PHARMACIST DOSING): Status: DC

## 2023-11-07 MED ORDER — KETOROLAC TROMETHAMINE 15 MG/ML IJ SOLN
15.0000 mg | Freq: Three times a day (TID) | INTRAMUSCULAR | Status: AC
  Administered 2023-11-07 – 2023-11-08 (×3): 15 mg via INTRAVENOUS
  Filled 2023-11-07 (×3): qty 1

## 2023-11-07 MED ORDER — HALOPERIDOL 0.5 MG PO TABS
0.5000 mg | ORAL_TABLET | ORAL | Status: DC | PRN
Start: 2023-11-07 — End: 2023-11-09

## 2023-11-07 MED ORDER — LACTATED RINGERS IV SOLN: INTRAVENOUS | Status: DC

## 2023-11-07 MED ORDER — HALOPERIDOL LACTATE 2 MG/ML PO CONC: 0.5000 mg | ORAL | Status: DC | PRN

## 2023-11-07 MED ORDER — HALOPERIDOL LACTATE 5 MG/ML IJ SOLN: 0.5000 mg | INTRAMUSCULAR | Status: DC | PRN

## 2023-11-07 NOTE — Progress Notes (Addendum)
 Subjective: No seizures. Had fever. Wife and sister at bedside  ROS: Unable to obtain due to poor mental status  Examination  Vital signs in last 24 hours: Temp:  [98 F (36.7 C)-101.6 F (38.7 C)] 101.3 F (38.5 C) (07/01 0746) Pulse Rate:  [106-133] 106 (07/01 1100) Resp:  [22-33] 24 (07/01 1100) BP: (93-135)/(58-98) 108/60 (07/01 1100) SpO2:  [91 %-97 %] 93 % (07/01 1100)  General: lying in bed, NAD Neuro: laying with eyes closed, didn't do any noxious stimulation as plan for hospice transition   Basic Metabolic Panel: Recent Labs  Lab 11/04/23 0635 11/05/23 0519 11/05/23 1832 11/07/23 0617  NA 132* 129* 130* 137  K 5.0 4.5 4.8 5.2*  CL 94* 96* 101 104  CO2 25 18* 19* 18*  GLUCOSE 331* 336* 238* 325*  BUN 36* 37* 40* 56*  CREATININE 1.03 0.81 0.78 2.44*  CALCIUM 10.3 10.0 10.1 10.8*  MG 2.5* 2.5*  --   --     CBC: Recent Labs  Lab 11/04/23 0635 11/07/23 1041  WBC 13.8* 12.5*  NEUTROABS 11.8* PENDING  HGB 17.0 15.6  HCT 48.8 45.1  MCV 89.2 91.5  PLT 319 189     Coagulation Studies: No results for input(s): LABPROT, INR in the last 72 hours.  Imaging No new brain imaging   ASSESSMENT AND PLAN:68 year old male with GBM status post chemoradiation who presented with focal status epilepticus which has since resolved.   Focal status epilepticus, resolved - Etiology: Likely due to underlying tumor -Continue Keppra 1500 mg twice daily.   -If patient remains excessively drowsy and seizures do not recur, can consider lowering to 1000 mg twice daily about 48 hours after fever improves - Ok to use benzos for clinical sz  -Discussed with family.  Planning to transition to hospice - Discussed plan with Dr. Perri via secure chat   I have spent a total of  35  minutes with the patient reviewing hospital notes,  test results, labs and examining the patient as well as establishing an assessment and plan that was discussed personally with the patient's family  at bedside.  > 50% of time was spent in direct patient care.    Arlin Krebs Epilepsy [facility] For questions after 5pm please refer to AMION to reach the Neurologist on call

## 2023-11-07 NOTE — Progress Notes (Signed)
 Pharmacy Antibiotic Note  Javier Graham is a 68 y.o. male admitted on 10/30/2023 to [facility] with cognitive and motor dysfunction.  Patient received steroid and bevacizumab for glioblastoma.  Had seizure and transferred to [facility] on 6/29.  Now with sepsis and Pharmacy has been consulted for vancomycin and Zosyn dosing.  Patient with significant AKI (SCr 0.78 > 2.44), Tmax 101.6.  Plan: Vanc 1750mg  IV x 1, dose per level Zosyn EID 3.375gm IV Q8H Monitor renal fxn, clinical progress, vanc level in AM  Height: 6' 1 (185.4 cm) Weight: 90.7 kg (200 lb) IBW/kg (Calculated) : 79.9  Temp (24hrs), Avg:100.4 F (38 C), Min:98 F (36.7 C), Max:101.6 F (38.7 C)  Recent Labs  Lab 11/04/23 0635 11/05/23 0519 11/05/23 1832 11/07/23 0617  WBC 13.8*  --   --   --   CREATININE 1.03 0.81 0.78 2.44*    Estimated Creatinine Clearance: 33.2 mL/min (A) (by C-G formula based on SCr of 2.44 mg/dL (H)).    No Known Allergies  Vanc 7/1 >> Zosyn 7/1 >>   6/29 MRSA PCR - negative 7/1 BCx -   Kirbi Farrugia D. Lendell, PharmD, BCPS, BCCCP 11/07/2023, 9:18 AM

## 2023-11-07 NOTE — Progress Notes (Signed)
 PT Cancellation Note  Patient Details Name: Javier Graham MRN: [id number] DOB: 11/14/1955   Cancelled Treatment:    Reason Eval/Treat Not Completed: Medical issues which prohibited therapy - pt remains somnolent, PT to follow up after GOC discussion later today.   [name] S, PT DPT Acute Rehabilitation Services Secure Chat Preferred  Office [phone]    [name] [name] 11/07/2023, 10:01 AM

## 2023-11-07 NOTE — Sepsis Progress Note (Signed)
 Elink monitoring for the code sepsis protocol.

## 2023-11-07 NOTE — Plan of Care (Signed)
  Problem: Education: Goal: Knowledge of General Education information will improve Description: Including pain rating scale, medication(s)/side effects and non-pharmacologic comfort measures Outcome: Not Progressing   Problem: Health Behavior/Discharge Planning: Goal: Ability to manage health-related needs will improve Outcome: Not Progressing   Problem: Clinical Measurements: Goal: Ability to maintain clinical measurements within normal limits will improve Outcome: Not Progressing Goal: Will remain free from infection Outcome: Not Progressing Goal: Diagnostic test results will improve Outcome: Not Progressing Goal: Respiratory complications will improve Outcome: Not Progressing Goal: Cardiovascular complication will be avoided Outcome: Not Progressing   Problem: Activity: Goal: Risk for activity intolerance will decrease Outcome: Not Progressing   Problem: Nutrition: Goal: Adequate nutrition will be maintained Outcome: Not Progressing   Problem: Coping: Goal: Level of anxiety will decrease Outcome: Not Progressing   Problem: Elimination: Goal: Will not experience complications related to bowel motility Outcome: Not Progressing Goal: Will not experience complications related to urinary retention Outcome: Not Progressing   Problem: Pain Managment: Goal: General experience of comfort will improve and/or be controlled Outcome: Not Progressing   Problem: Safety: Goal: Ability to remain free from injury will improve Outcome: Not Progressing   Problem: Skin Integrity: Goal: Risk for impaired skin integrity will decrease Outcome: Not Progressing   Problem: Education: Goal: Ability to describe self-care measures that may prevent or decrease complications (Diabetes Survival Skills Education) will improve Outcome: Not Progressing Goal: Individualized Educational Video(s) Outcome: Not Progressing   Problem: Coping: Goal: Ability to adjust to condition or change in  health will improve Outcome: Not Progressing   Problem: Fluid Volume: Goal: Ability to maintain a balanced intake and output will improve Outcome: Not Progressing   Problem: Health Behavior/Discharge Planning: Goal: Ability to identify and utilize available resources and services will improve Outcome: Not Progressing Goal: Ability to manage health-related needs will improve Outcome: Not Progressing   Problem: Metabolic: Goal: Ability to maintain appropriate glucose levels will improve Outcome: Not Progressing   Problem: Nutritional: Goal: Maintenance of adequate nutrition will improve Outcome: Not Progressing Goal: Progress toward achieving an optimal weight will improve Outcome: Not Progressing   Problem: Skin Integrity: Goal: Risk for impaired skin integrity will decrease Outcome: Not Progressing   Problem: Tissue Perfusion: Goal: Adequacy of tissue perfusion will improve Outcome: Not Progressing   Problem: Education: Goal: Knowledge of the prescribed therapeutic regimen will improve Outcome: Not Progressing   Problem: Coping: Goal: Ability to identify and develop effective coping behavior will improve Outcome: Not Progressing   Problem: Clinical Measurements: Goal: Quality of life will improve Outcome: Not Progressing   Problem: Respiratory: Goal: Verbalizations of increased ease of respirations will increase Outcome: Not Progressing   Problem: Role Relationship: Goal: Family's ability to cope with current situation will improve Outcome: Not Progressing Goal: Ability to verbalize concerns, feelings, and thoughts to partner or family member will improve Outcome: Not Progressing   Problem: Pain Management: Goal: Satisfaction with pain management regimen will improve Outcome: Not Progressing  Patient is End of Life

## 2023-11-07 NOTE — Progress Notes (Addendum)
 PROGRESS NOTE    Javier Graham  FMW:[id number] DOB: 1955/11/08 DOA: 10/30/2023 PCP: Lari Elspeth BRAVO, MD  Chief Complaint  Patient presents with   Altered Mental Status    Brief Narrative:   68 yo male with GBM s/p resection in February as well as radiation and initiation of Temozolomide  who presented to [facility] on 6/23 after general decline and difficulty with communication. Pt was admitted and started on high dose steroids and Bevacizumab.  Pt was progressing for the duration of his stay and was being evaluated for SNF based on PT/OT recommendations. He was being preparing for discharge to SNF 6/30. However, thru the evening pt deteriorated mentally, unable able to verbalize and minimally opening eyes to sternal rub or verbal stimuli. Subsequently, pt had onset new onset of jaw twitch, progressing to RUE and RLE twitches as well. Pt was loaded with Keppra at present after virtual visit as well as 2mg  ativan. Neuro recommended transfer to ICU at [facility] for LTM and closer neuro monitoring.    Pt has been somnolent since presentation to [facility] neuro ICU. Protecting airway. He grimaces to painful stimuli, but does not remove noxious stimuli.   Per discussion with family, pt would not want intubation. Family is clear per report that he is to remain DNR/DNI based on his previously expressed wishes.   Significant Events Admitted 6/23 to [facility] with ams likely 2/2 GBM s/p resection 6/29 minimally responsive, transferred to [facility] ICU with concern for status epilepticus Transferred to [facility] for LTM and closer neuro monitoring 2/2 progressive encephalopathy Continues to be somnolent, MRI done: EEG done, sharps in parieto-occipital region. 7/1 transferred to hospitalist service  Transitioned to comfort measures on 7/1 after discussion with palliative  Assessment & Plan:   Principal Problem:   Status epilepticus (HCC) Active Problems:   Glioblastoma, IDH-wildtype (HCC)   Encephalopathy acute   Acute metabolic  encephalopathy   Uncontrolled type 2 diabetes mellitus with hyperglycemia, without long-term current use of insulin  (HCC)   Essential hypertension   Left trochlear nerve palsy   Goals of care, counseling/discussion  Goals of care Ongoing conversations with neuro oncology and palliative care.  Addendum: he's been transitioned to comfort measures after discussion with palliative  Sepsis  Unclear source at this time, but fevers, tachycardia, tachypnea.  Labs pending to evaluate WBC count.  Mild hypotension.  IVF, abx, cultures CXR, renal US  given AKI  Additional workup as indicated  Status Epilepticus Acute Metabolic Encephalopathy  Post Ictal State Glioblastoma s/p resection, radiation, temozolomide  Appreciate neurology and neuro oncology assistance Hoping for gradual improvement in post ictal state over the coming days per 6/30 neuro oncology note (see 6/30 note).   Keppra 1500 mg BID, per 6/30 neurology note consider lowering dose to 1000 mg BID today, will discuss with neurology - recommending holding on dose reduction for ~48 hrs with new infectious process above (if seizures recur, recommending adding vimpat) Addendum: keppra reduced based on renal function  AKI Due to infection above/sepsis Follow UA, renal US  Follow with IVF  Dysphagia Due to AMS Continue IVF Goals of care as above  T2DM SSI Long acting Steroids contributing to hyperglycemia  HTN Monitor     DVT prophylaxis: lovenox  Code Status: DNR Family Communication: wife over phone Disposition:   Status is: Inpatient Remains inpatient appropriate because: need   Consultants:  [facility] Neuro oncology Palliative   Procedures:  EEG IMPRESSION: This study showed evidence of epileptogenicity and cortical dysfunction arising from right parieto-occipital region.  Additionally  there is moderate to severe diffuse encephalopathy.  No definite seizures were noted.   Summary:  Right Carotid: Velocities in  the right ICA are consistent with Analena Gama 1-39%  stenosis.   Left Carotid: Velocities in the left ICA are consistent with Londyn Hotard 1-39%  stenosis.   Vertebrals: Left vertebral artery demonstrates antegrade flow. Right  vertebral             artery demonstrates high resistant flow.   *See table(s) above for measurements and observations.   Echo IMPRESSIONS     1. The left ventricle has normal systolic function with an ejection  fraction of 60-65%. The cavity size was normal. Left ventricular diastolic  Doppler parameters are consistent with impaired relaxation. No evidence of  left ventricular regional wall  motion abnormalities.   2. The right ventricle has normal systolic function. The cavity was  normal. There is no increase in right ventricular wall thickness.   Antimicrobials:  Anti-infectives (From admission, onward)    Start     Dose/Rate Route Frequency Ordered Stop   11/07/23 0920  vancomycin variable dose per unstable renal function (pharmacist dosing)         Does not apply See admin instructions 11/07/23 0920     11/07/23 0845  vancomycin (VANCOREADY) IVPB 1750 mg/350 mL        1,750 mg 175 mL/hr over 120 Minutes Intravenous  Once 11/07/23 0757     11/07/23 0830  piperacillin-tazobactam (ZOSYN) IVPB 3.375 g        3.375 g 12.5 mL/hr over 240 Minutes Intravenous Every 8 hours 11/07/23 0757         Subjective: unresponsive  Objective: Vitals:   11/07/23 0606 11/07/23 0746 11/07/23 0800 11/07/23 0904  BP:  93/64 (!) 94/58 100/65  Pulse:  (!) 125  (!) 120  Resp:  (!) 32 (!) 33 (!) 26  Temp: (!) 101 F (38.3 C) (!) 101.3 F (38.5 C)    TempSrc: Axillary Axillary    SpO2:  94% 94% 93%  Weight:      Height:        Intake/Output Summary (Last 24 hours) at 11/07/2023 0950 Last data filed at 11/07/2023 0400 Gross per 24 hour  Intake 442.53 ml  Output 800 ml  Net -357.47 ml   Filed Weights   10/30/23 1757  Weight: 90.7 kg    Examination:  General exam: ill  appearing, unresponsive Respiratory system: unlabored Cardiovascular system: RRR Gastrointestinal system: Abdomen is nondistended, soft and nontender.  Central nervous system: obtunded Extremities: no LEE   Data Reviewed: I have personally reviewed following labs and imaging studies  CBC: Recent Labs  Lab 11/04/23 0635  WBC 13.8*  NEUTROABS 11.8*  HGB 17.0  HCT 48.8  MCV 89.2  PLT 319    Basic Metabolic Panel: Recent Labs  Lab 11/04/23 0635 11/05/23 0519 11/05/23 1832 11/07/23 0617  NA 132* 129* 130* 137  K 5.0 4.5 4.8 5.2*  CL 94* 96* 101 104  CO2 25 18* 19* 18*  GLUCOSE 331* 336* 238* 325*  BUN 36* 37* 40* 56*  CREATININE 1.03 0.81 0.78 2.44*  CALCIUM 10.3 10.0 10.1 10.8*  MG 2.5* 2.5*  --   --     GFR: Estimated Creatinine Clearance: 33.2 mL/min (Dvonte Gatliff) (by C-G formula based on SCr of 2.44 mg/dL (H)).  Liver Function Tests: Recent Labs  Lab 11/04/23 0635 11/05/23 0519 11/05/23 1832  AST 14* 12* 10*  ALT 25 24 22   ALKPHOS 69  64 66  BILITOT 1.8* 1.6* 1.4*  PROT 7.7 7.1 7.2  ALBUMIN 4.3 3.9 3.9    CBG: Recent Labs  Lab 11/06/23 1607 11/06/23 2011 11/06/23 2304 11/07/23 0526 11/07/23 0932  GLUCAP 226* 290* 325* 323* 263*     Recent Results (from the past 240 hours)  MRSA Next Gen by PCR, Nasal     Status: None   Collection Time: 11/05/23  9:16 PM   Specimen: Nasal Mucosa; Nasal Swab  Result Value Ref Range Status   MRSA by PCR Next Gen NOT DETECTED NOT DETECTED Final    Comment: (NOTE) The GeneXpert MRSA Assay (FDA approved for NASAL specimens only), is one component of [facility] [place] comprehensive MRSA colonization surveillance program. It is not intended to diagnose MRSA infection nor to guide or monitor treatment for MRSA infections. Test performance is not FDA approved in patients less than 54 years old. Performed at [facility], [address] [place], [place] [address]          Radiology Studies: Overnight EEG with video Result  Date: 11/06/2023 Shelton Arlin KIDD, MD     11/06/2023  8:39 AM Patient Name: ASHTIAN VILLACIS MRN: [id number] Epilepsy Attending: Arlin KIDD Shelton Referring Physician/Provider: Jerrie Lola LITTIE, MD Duration: 11/05/2023 2316 to 11/06/2023 0825 Patient history: 68 y.o. man with history of glioblastoma s/p resection, radiation, and temozolomide  admitted on 6/23 for evaluation of general decline in function and difficulty communicating with family. On Sunday 6/29 of jaw twitching around 11 AM that has continued despite Marcelo Ickes 3000 mg IV loading dose of Keppra; resolved after Ativan and additional 1500 mg Keppra. EEG to evaluate for seizure. Level of alertness: Awake, asleep AEDs during EEG study: LEV Technical aspects: This EEG study was done with scalp electrodes positioned according to the 10-20 International system of electrode placement. Electrical activity was reviewed with band pass filter of 1-70Hz , sensitivity of 7 uV/mm, display speed of 35mm/sec with Keeva Reisen 60Hz  notched filter applied as appropriate. EEG data were recorded continuously and digitally stored.  Video monitoring was available and reviewed as appropriate. Description: During awake state, no clear posterior dominant rhythm was seen.  Sleep was characterized by sleep spindles (12 to 14 Hz), maximal frontocentral region.  EEG showed continuous generalized and maximal right parieto-occipital region 3 to 6 Hz theta-delta slowing.  Sharp waves were also noted in right parieto-occipital region.  Hyperventilation and photic stimulation were not performed.   EEG was disconnected between 11/05/2023 2355 to 11/06/2023 0040 for MRI brain. ABNORMALITY -Sharp wave, right parieto-occipital region - Continuous slow, generalized and maximal right parieto-occipital region IMPRESSION: This study showed evidence of epileptogenicity and cortical dysfunction arising from right parieto-occipital region.  Additionally there is moderate to severe diffuse encephalopathy.  No definite  seizures were noted. Arlin KIDD Shelton   MR BRAIN W WO CONTRAST Result Date: 11/06/2023 CLINICAL DATA:  Seizure, new-onset, no history of trauma EXAM: MRI HEAD WITHOUT AND WITH CONTRAST TECHNIQUE: Multiplanar, multiecho pulse sequences of the brain and surrounding structures were obtained without and with intravenous contrast. CONTRAST:  9mL GADAVIST  GADOBUTROL  1 MMOL/ML IV SOLN COMPARISON:  Noncontrast MRI head June 24, 25. Outside MRI with contrast from May 29 25. FINDINGS: Brain: Postoperative changes of right parietal craniotomy with subjacent resection cavity, similar to recent MRI head and similar versus slightly decreased in size in comparison to May 29 25. In comparison to May postcontrast study, peripheral enhancement surrounding the cavity is mildly improved in there is also mild decrease in  enhancement along the anterior aspect of the postop cavity. Persistent surrounding Allyanna Appleman mildly expansile T2/FLAIR hyperintensity. Additional site of mildly expansile T2 hyperintensity in the right frontal parenchyma and persistent enhancement in the inferior right frontal lobe (series 16, image 32). Similar enhancing 2 cm extra-axial dural-based mass along the planum sphenoidale, compatible with meningioma. No evidence of acute infarct, midline shift, or hydrocephalus. Similar cerebral atrophy and ex vacuo ventricular dilation. Vascular: Major arterial flow voids are maintained. Skull and upper cervical spine: Status post right posterior craniotomy. No other marrow signal abnormality. Sinuses/Orbits: Right maxillary sinus retention cyst. No acute orbital findings. Other: No mastoid effusions. IMPRESSION: 1. In comparison to recent MRI, similar resection cavity. In comparison to outside May 29 MRI with contrast, mildly decreased enhancement and mildly decreased expansile T2 hyperintensity surrounding the resection cavity and anterior to the resection cavity which likely represents residual tumor. 2. No substantial change  in T2 hyperintense signal in the right frontal lobe with mild enhancement, potentially Vanessa Alesi second focus of glioma. 3. Stable planum sphenoidale meningioma. Electronically Signed   By: Gilmore GORMAN Molt M.D.   On: 11/06/2023 02:05        Scheduled Meds:  Chlorhexidine Gluconate Cloth  6 each Topical Daily   dexamethasone  (DECADRON ) injection  8 mg Intravenous Q8H   enoxaparin  (LOVENOX ) injection  40 mg Subcutaneous Q24H   insulin  aspart  0-20 Units Subcutaneous Q4H   insulin  glargine-yfgn  28 Units Subcutaneous Daily   levETIRAcetam  1,500 mg Intravenous Q12H   pantoprazole (PROTONIX) IV  40 mg Intravenous Q24H   vancomycin variable dose per unstable renal function (pharmacist dosing)   Does not apply See admin instructions   Continuous Infusions:  lactated ringers     piperacillin-tazobactam (ZOSYN)  IV 3.375 g (11/07/23 0927)   vancomycin       LOS: 8 days    Time spent: over 30 min 40 min critical care time with sepsis   Meliton Monte, MD [facility]   To contact the attending provider between 7A-7P or the covering provider during after hours 7P-7A, please log into the web site [email or web address] and access using universal [place] password for that web site. If you do not have the password, please call the hospital operator.  11/07/2023, 9:50 AM

## 2023-11-07 NOTE — Sepsis Progress Note (Signed)
 Asking bedside nurse if lactic acid and blood cultures were drawn because antibiotics hung. RN read my secure chat

## 2023-11-07 NOTE — Sepsis Progress Note (Signed)
 Notified bedside nurse of need to draw repeat lactic acid @ 1113.

## 2023-11-07 NOTE — Plan of Care (Signed)
 Problem: Education: Goal: Knowledge of General Education information will improve Description: Including pain rating scale, medication(s)/side effects and non-pharmacologic comfort measures 11/07/2023 0404 by Marvis Kenneth SAILOR, RN Outcome: Progressing 11/06/2023 2117 by Marvis Kenneth SAILOR, RN Outcome: Progressing   Problem: Health Behavior/Discharge Planning: Goal: Ability to manage health-related needs will improve 11/07/2023 0404 by Marvis Kenneth SAILOR, RN Outcome: Progressing 11/06/2023 2117 by Marvis Kenneth SAILOR, RN Outcome: Progressing   Problem: Clinical Measurements: Goal: Ability to maintain clinical measurements within normal limits will improve 11/07/2023 0404 by Marvis Kenneth SAILOR, RN Outcome: Progressing 11/06/2023 2117 by Marvis Kenneth SAILOR, RN Outcome: Progressing Goal: Will remain free from infection 11/07/2023 0404 by Marvis Kenneth SAILOR, RN Outcome: Progressing 11/06/2023 2117 by Marvis Kenneth SAILOR, RN Outcome: Progressing Goal: Diagnostic test results will improve 11/07/2023 0404 by Marvis Kenneth SAILOR, RN Outcome: Progressing 11/06/2023 2117 by Marvis Kenneth SAILOR, RN Outcome: Progressing Goal: Respiratory complications will improve 11/07/2023 0404 by Marvis Kenneth SAILOR, RN Outcome: Progressing 11/06/2023 2117 by Marvis Kenneth SAILOR, RN Outcome: Progressing Goal: Cardiovascular complication will be avoided 11/07/2023 0404 by Marvis Kenneth SAILOR, RN Outcome: Progressing 11/06/2023 2117 by Marvis Kenneth SAILOR, RN Outcome: Progressing   Problem: Activity: Goal: Risk for activity intolerance will decrease 11/07/2023 0404 by Marvis Kenneth SAILOR, RN Outcome: Progressing 11/06/2023 2117 by Marvis Kenneth SAILOR, RN Outcome: Progressing   Problem: Nutrition: Goal: Adequate nutrition will be maintained 11/07/2023 0404 by Marvis Kenneth SAILOR, RN Outcome: Progressing 11/06/2023 2117 by Marvis Kenneth SAILOR, RN Outcome: Progressing   Problem: Coping: Goal: Level of anxiety will  decrease 11/07/2023 0404 by Marvis Kenneth SAILOR, RN Outcome: Progressing 11/06/2023 2117 by Marvis Kenneth SAILOR, RN Outcome: Progressing   Problem: Elimination: Goal: Will not experience complications related to bowel motility 11/07/2023 0404 by Marvis Kenneth SAILOR, RN Outcome: Progressing 11/06/2023 2117 by Marvis Kenneth SAILOR, RN Outcome: Progressing Goal: Will not experience complications related to urinary retention 11/07/2023 0404 by Marvis Kenneth SAILOR, RN Outcome: Progressing 11/06/2023 2117 by Marvis Kenneth SAILOR, RN Outcome: Progressing   Problem: Pain Managment: Goal: General experience of comfort will improve and/or be controlled 11/07/2023 0404 by Marvis Kenneth SAILOR, RN Outcome: Progressing 11/06/2023 2117 by Marvis Kenneth SAILOR, RN Outcome: Progressing   Problem: Safety: Goal: Ability to remain free from injury will improve 11/07/2023 0404 by Marvis Kenneth SAILOR, RN Outcome: Progressing 11/06/2023 2117 by Marvis Kenneth SAILOR, RN Outcome: Progressing   Problem: Skin Integrity: Goal: Risk for impaired skin integrity will decrease 11/07/2023 0404 by Marvis Kenneth SAILOR, RN Outcome: Progressing 11/06/2023 2117 by Marvis Kenneth SAILOR, RN Outcome: Progressing   Problem: Education: Goal: Ability to describe self-care measures that may prevent or decrease complications (Diabetes Survival Skills Education) will improve 11/07/2023 0404 by Marvis Kenneth SAILOR, RN Outcome: Progressing 11/06/2023 2117 by Marvis Kenneth SAILOR, RN Outcome: Progressing Goal: Individualized Educational Video(s) 11/07/2023 0404 by Marvis Kenneth SAILOR, RN Outcome: Progressing 11/06/2023 2117 by Marvis Kenneth SAILOR, RN Outcome: Progressing   Problem: Coping: Goal: Ability to adjust to condition or change in health will improve 11/07/2023 0404 by Marvis Kenneth SAILOR, RN Outcome: Progressing 11/06/2023 2117 by Marvis Kenneth SAILOR, RN Outcome: Progressing   Problem: Fluid Volume: Goal: Ability to maintain a balanced intake and  output will improve 11/07/2023 0404 by Marvis Kenneth SAILOR, RN Outcome: Progressing 11/06/2023 2117 by Marvis Kenneth SAILOR, RN Outcome: Progressing   Problem: Health Behavior/Discharge Planning: Goal: Ability to identify and utilize available resources and services will improve 11/07/2023 0404 by Marvis Kenneth SAILOR, RN Outcome: Progressing 11/06/2023 2117 by  Marvis Kenneth SAILOR, RN Outcome: Progressing Goal: Ability to manage health-related needs will improve 11/07/2023 0404 by Marvis Kenneth SAILOR, RN Outcome: Progressing 11/06/2023 2117 by Marvis Kenneth SAILOR, RN Outcome: Progressing   Problem: Metabolic: Goal: Ability to maintain appropriate glucose levels will improve 11/07/2023 0404 by Marvis Kenneth SAILOR, RN Outcome: Progressing 11/06/2023 2117 by Marvis Kenneth SAILOR, RN Outcome: Progressing   Problem: Nutritional: Goal: Maintenance of adequate nutrition will improve 11/07/2023 0404 by Marvis Kenneth SAILOR, RN Outcome: Progressing 11/06/2023 2117 by Marvis Kenneth SAILOR, RN Outcome: Progressing Goal: Progress toward achieving an optimal weight will improve 11/07/2023 0404 by Marvis Kenneth SAILOR, RN Outcome: Progressing 11/06/2023 2117 by Marvis Kenneth SAILOR, RN Outcome: Progressing   Problem: Skin Integrity: Goal: Risk for impaired skin integrity will decrease 11/07/2023 0404 by Marvis Kenneth SAILOR, RN Outcome: Progressing 11/06/2023 2117 by Marvis Kenneth SAILOR, RN Outcome: Progressing   Problem: Tissue Perfusion: Goal: Adequacy of tissue perfusion will improve 11/07/2023 0404 by Marvis Kenneth SAILOR, RN Outcome: Progressing 11/06/2023 2117 by Marvis Kenneth SAILOR, RN Outcome: Progressing

## 2023-11-07 NOTE — Sepsis Progress Note (Signed)
Antibiotics hung before blood cultures drawn 

## 2023-11-07 NOTE — Progress Notes (Signed)
 Palliative:  HPI: 68 y.o. male  with past medical history of glioblastoma s/p resection Feb 2025 and radiation with initiation of temozolomide , HTN, DM2, insomnia admitted on 10/30/2023 with general decline and difficulty with communication and jaw twitching being treated for seizure activity in the setting of GBM recurrence vs inflammation resulting from radiation therapy.   I met today at [facility] bedside along with wife, son, and sister. Javier Graham continues to be unresponsive. We reviewed his changes since yesterday with high fever, low blood pressure, and concern for infection and sepsis. We spent time reviewing labs and status. We revisited goals of care especially in light of changes and decline. We reviewed all options moving forward including continuing with treatment of infection. We discussed the option of comfort care. We spent time exploring what Javier Graham would want. Ultimately family all agree that he would not be happy with even best case scenario if we are able to control seizures and infection. They believe that transition to full comfort care is in his best interest. We discussed timing of transition and if there is anyone else that needs to be here. They report that everyone has had a chance to be here and they wish to proceed with comfort care at this time. Offered spiritual care support - they will notify their personal pastor - educated on hospital chaplain availability and that they can be called for additional support at any time.   We discussed prognosis as likely days or less. We reviewed hospice options but with changes from yesterday we all agree with transition to comfort now in the hospital and we can reassess for appropriateness of transition to hospice facility. I feel that remaining in the hospital is likely going to be the best option given significant decline since yesterday. We discussed transfer to [facility].   All questions/concerns addressed. Emotional support provided. Updated RN, Dr.  Perri, and Dr. Buckley.   Exam: Unresponsive. No distress. Breathing regular, unlabored but shallow. Abd soft. Warm to touch - febrile. BP low but adequate after 2L bolus.   Plan: - DNR/DNI - Full comfort care - Adjust medications as needed to ensure comfort and symptom management  60 min  Javier Kitty, NP Palliative Medicine Team Pager [phone] (Please see [email or web address] for schedule) Team Phone [phone]

## 2023-11-07 NOTE — Sepsis Progress Note (Signed)
 Notified provider and bedside nurse of need to order and administer fluid bolus pt needs 2721cc Blood cultures finally documented and were drawn @0913 , so antibiotics given after they were drawn @ [phone]

## 2023-11-07 NOTE — Sepsis Progress Note (Signed)
Notified bedside nurse of need to draw lactic acid and blood cultures.  

## 2023-11-07 NOTE — Progress Notes (Signed)
 Pt transferred to 6N15 with RN and NT with all personal belongings. Handoff given to Vickie, Charity fundraiser. Pt's wife, Donny, called and notified of pt's transfer.

## 2023-11-07 DEATH — deceased

## 2023-11-08 DIAGNOSIS — G40901 Epilepsy, unspecified, not intractable, with status epilepticus: Secondary | ICD-10-CM | POA: Diagnosis not present

## 2023-11-08 DIAGNOSIS — G9340 Encephalopathy, unspecified: Secondary | ICD-10-CM | POA: Diagnosis not present

## 2023-11-08 DIAGNOSIS — L8990 Pressure ulcer of unspecified site, unspecified stage: Secondary | ICD-10-CM | POA: Insufficient documentation

## 2023-11-08 DIAGNOSIS — Z515 Encounter for palliative care: Secondary | ICD-10-CM | POA: Diagnosis not present

## 2023-11-08 DIAGNOSIS — I10 Essential (primary) hypertension: Secondary | ICD-10-CM | POA: Diagnosis not present

## 2023-11-08 DIAGNOSIS — G9341 Metabolic encephalopathy: Secondary | ICD-10-CM | POA: Diagnosis not present

## 2023-11-08 MED ORDER — GLYCOPYRROLATE 0.2 MG/ML IJ SOLN
0.2000 mg | Freq: Three times a day (TID) | INTRAMUSCULAR | Status: DC
  Administered 2023-11-08 (×2): 0.2 mg via INTRAVENOUS
  Filled 2023-11-08 (×2): qty 1

## 2023-11-08 MED ORDER — HYDROMORPHONE HCL 1 MG/ML IJ SOLN
1.0000 mg | INTRAMUSCULAR | Status: DC
  Administered 2023-11-08 (×2): 1 mg via INTRAVENOUS
  Filled 2023-11-08 (×2): qty 1

## 2023-11-08 MED ORDER — KETOROLAC TROMETHAMINE 15 MG/ML IJ SOLN
15.0000 mg | Freq: Three times a day (TID) | INTRAMUSCULAR | Status: DC
  Administered 2023-11-08: 15 mg via INTRAVENOUS
  Filled 2023-11-08: qty 1

## 2023-11-09 ENCOUNTER — Other Ambulatory Visit: Payer: Self-pay

## 2023-11-09 ENCOUNTER — Inpatient Hospital Stay

## 2023-11-09 ENCOUNTER — Inpatient Hospital Stay: Admitting: Internal Medicine

## 2023-11-09 NOTE — Progress Notes (Signed)
Patient disenrolled from program

## 2023-11-12 LAB — CULTURE, BLOOD (ROUTINE X 2)
Culture: NO GROWTH
Culture: NO GROWTH
Special Requests: ADEQUATE
Special Requests: ADEQUATE

## 2023-11-13 LAB — GLUCOSE, CAPILLARY: Glucose-Capillary: 137 mg/dL — ABNORMAL HIGH (ref 70–99)

## 2023-12-08 NOTE — Progress Notes (Signed)
 Palliative:  HPI: 68 y.o. male  with past medical history of glioblastoma s/p resection Feb 2025 and radiation with initiation of temozolomide , HTN, DM2, insomnia admitted on 10/30/2023 with general decline and difficulty with communication and jaw twitching being treated for seizure activity in the setting of GBM recurrence vs inflammation resulting from radiation therapy.   I met today at [facility] bedside. Son at bedside. We were joined by Javier Graham's wife and sister, Javier Graham and Javier Graham, as well. Javier Graham appears to be resting mostly comfortably. His breathing pattern is changing - breathing quickly and alternating more shallow breathes. Explained to family that we will provide more medication to relax his breathing and slow it down. He is running high fever again and this may be contributing to tachypnea. Will adjust medication for fever. We reviewed his progression towards end of life. He is warm and tacky from fever. Feet are cold to touch. Discussed with family signs and what to anticipate as he progresses at end of life. They have been discussing plans. Javier Graham wishes to be cremated and Javier Graham has a recommendation on funeral home that she plans to work with. Spent time reviewing the difficulty in the waiting period at end of life when we know what is coming. I explained that I will be off service until Monday but will have a colleague follow up to ensure Javier Graham remains comfortable and cared for. We will plan to remain hospitalized to ensure comfort while continued IV medications for comfort and seizure management.   All questions/concerns addressed. Emotional support provided. I did discuss with Dr. Kathrin who agrees with plan.   Exam: Unresponsive. Tachycardic. Febrile. [place] stokes breathing. Abd soft. Feet cold to touch.   Plan: - DNR/DNI - Medication adjustments to ensure comfort - Anticipate hospital death  50 min  Javier Kitty, NP Palliative Medicine Team Pager [phone] (Please see [email or web address] for  schedule) Team Phone [phone]

## 2023-12-08 NOTE — Plan of Care (Signed)
  Problem: Education: Goal: Knowledge of General Education information will improve Description: Including pain rating scale, medication(s)/side effects and non-pharmacologic comfort measures Outcome: Not Progressing   Problem: Health Behavior/Discharge Planning: Goal: Ability to manage health-related needs will improve Outcome: Not Progressing   Problem: Clinical Measurements: Goal: Ability to maintain clinical measurements within normal limits will improve Outcome: Not Progressing Goal: Will remain free from infection Outcome: Not Progressing Goal: Diagnostic test results will improve Outcome: Not Progressing Goal: Respiratory complications will improve Outcome: Not Progressing Goal: Cardiovascular complication will be avoided Outcome: Not Progressing   Problem: Activity: Goal: Risk for activity intolerance will decrease Outcome: Not Progressing   Problem: Nutrition: Goal: Adequate nutrition will be maintained Outcome: Not Progressing   Problem: Coping: Goal: Level of anxiety will decrease Outcome: Not Progressing   Problem: Elimination: Goal: Will not experience complications related to bowel motility Outcome: Not Progressing Goal: Will not experience complications related to urinary retention Outcome: Not Progressing   Problem: Pain Managment: Goal: General experience of comfort will improve and/or be controlled Outcome: Not Progressing   Problem: Safety: Goal: Ability to remain free from injury will improve Outcome: Not Progressing   Problem: Skin Integrity: Goal: Risk for impaired skin integrity will decrease Outcome: Not Progressing   Problem: Education: Goal: Ability to describe self-care measures that may prevent or decrease complications (Diabetes Survival Skills Education) will improve Outcome: Not Progressing Goal: Individualized Educational Video(s) Outcome: Not Progressing   Problem: Coping: Goal: Ability to adjust to condition or change in  health will improve Outcome: Not Progressing   Problem: Fluid Volume: Goal: Ability to maintain a balanced intake and output will improve Outcome: Not Progressing   Problem: Health Behavior/Discharge Planning: Goal: Ability to identify and utilize available resources and services will improve Outcome: Not Progressing Goal: Ability to manage health-related needs will improve Outcome: Not Progressing   Problem: Metabolic: Goal: Ability to maintain appropriate glucose levels will improve Outcome: Not Progressing   Problem: Nutritional: Goal: Maintenance of adequate nutrition will improve Outcome: Not Progressing Goal: Progress toward achieving an optimal weight will improve Outcome: Not Progressing   Problem: Skin Integrity: Goal: Risk for impaired skin integrity will decrease Outcome: Not Progressing   Problem: Tissue Perfusion: Goal: Adequacy of tissue perfusion will improve Outcome: Not Progressing   Problem: Education: Goal: Knowledge of the prescribed therapeutic regimen will improve Outcome: Not Progressing   Problem: Coping: Goal: Ability to identify and develop effective coping behavior will improve Outcome: Not Progressing   Problem: Clinical Measurements: Goal: Quality of life will improve Outcome: Not Progressing   Problem: Respiratory: Goal: Verbalizations of increased ease of respirations will increase Outcome: Not Progressing   Problem: Role Relationship: Goal: Family's ability to cope with current situation will improve Outcome: Not Progressing Goal: Ability to verbalize concerns, feelings, and thoughts to partner or family member will improve Outcome: Not Progressing   Problem: Pain Management: Goal: Satisfaction with pain management regimen will improve Outcome: Not Progressing  End of life

## 2023-12-08 NOTE — Death Summary Note (Signed)
 DEATH SUMMARY   Patient Details  Name: Javier Graham MRN: [id number] DOB: Sep 21, 1955 ERE:Almipwz, Javier BRAVO, MD Admission/Discharge Information   Admit Date:  11/15/2023  Date of Death: Date of Death: 11-24-2023  Time of Death: Time of Death: 1923  Length of Stay: 9   Principle Cause of death: Status epilepticus  Hospital Diagnoses: Principal Problem:   Status epilepticus (HCC) Active Problems:   Glioblastoma, IDH-wildtype (HCC)   Encephalopathy acute   Acute metabolic encephalopathy   Uncontrolled type 2 diabetes mellitus with hyperglycemia, without long-term current use of insulin  (HCC)   Essential hypertension   Left trochlear nerve palsy   Goals of care, counseling/discussion   End of life care   Pressure injury of skin   Hospital Course: 68 yo male with GBM s/p resection in February as well as radiation and initiation of Temozolomide  who presented to [facility] on 11-15-2023 after general decline and difficulty with communication. Pt was admitted and started on high dose steroids and Bevacizumab.  Pt was progressing for the duration of his stay and was being evaluated for SNF based on PT/OT recommendations. He was being preparing for discharge to SNF 6/30. However, thru the evening pt deteriorated mentally, unable able to verbalize and minimally opening eyes to sternal rub or verbal stimuli. Subsequently, pt had onset new onset of jaw twitch, progressing to RUE and RLE twitches as well. Pt was loaded with Keppra at present after virtual visit as well as 2mg  ativan. Neuro recommended transfer to ICU at [facility] for LTM and closer neuro monitoring.    Pt has been somnolent since presentation to [facility] neuro ICU. Protecting airway. He grimaces to painful stimuli, but does not remove noxious stimuli.   Per discussion with family, pt would not want intubation. Family is clear per report that he is to remain DNR/DNI based on his previously expressed wishes.    Significant Events Admitted 2023/11/15 to [facility]  with ams likely 2/2 GBM s/p resection 6/29 minimally responsive, transferred to [facility] ICU with concern for status epilepticus Transferred to [facility] for LTM and closer neuro monitoring 2/2 progressive encephalopathy Continues to be somnolent, MRI done: EEG done, sharps in parieto-occipital region. 11/23/23 transferred to hospitalist service  Transitioned to comfort measures on 2023-11-23 after discussion with palliative Passed away on 2023-11-24.   Assessment and Plan: End-of-life care: Transitioned to full comfort care on 2023-11-23, and passed away on November 24, 2023   SIRS: Not present at admission.  Febrile, tachycardic and tachypneic with leukocytosis and lactic acidosis.  No clear source of infection yet.  Blood cultures on November 23, 2023 NGTD.  UA on admission basically negative.  Initial and repeat CXR without acute finding.  Now for comfort care.  Antibiotic discontinued.  Status Epilepticus Acute Metabolic Encephalopathy  Post Ictal State Glioblastoma s/p resection, radiation, temozolomide  -Appreciate neurology and neuro oncology assistance -Continue Keppra and Decadron    Acute respiratory failure with hypercapnia: VBG  7.08/80/<31/24.  Improved on repeat VBG.  On 6/28.   AKI: Unclear etiology.  Renal ultrasound without significant finding.   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   Essential hypertension   Hyperkalemia   Hyponatremia   Lactic acidosis       Procedures: Brain tumor resection  Consultations: neuro-oncology, neurology, psychiatry, critical care, palliative medicine  The results of significant diagnostics from this hospitalization (including imaging, microbiology, ancillary and laboratory) are listed below for reference.   Significant Diagnostic Studies: DG CHEST PORT 1 VIEW  Result Date: 11/07/2023 CLINICAL DATA:  Fever EXAM: PORTABLE CHEST 1 VIEW COMPARISON:  X-ray 10/30/2023 FINDINGS: No consolidation, pneumothorax or  effusion. Normal cardiopericardial silhouette. Overlapping cardiac leads. Azygous fissure. IMPRESSION: No acute cardiopulmonary disease. Electronically Signed   By: Ranell Bring M.D.   On: 11/07/2023 11:33   US  RENAL Result Date: 11/07/2023 CLINICAL DATA:  Acute kidney injury EXAM: RENAL / URINARY TRACT ULTRASOUND COMPLETE COMPARISON:  None Available. FINDINGS: Right Kidney: Renal measurements: 13.4 x 7.4 x 6.9 cm = volume: 359.1 mL. Prominent column of Bertin. No collecting system dilatation or perinephric fluid. There is a near anechoic round structure along the mid kidney measuring 13 x 7 x 7 mm. This has some internal echoes. Left Kidney: Renal measurements: 12.6 x 6.5 x 6.8 cm = volume: 290.3 mL. Echogenicity within normal limits. No mass or hydronephrosis visualized. Bladder: Appears normal for degree of bladder distention. Other: None. IMPRESSION: No collecting system dilatation. Minimally complex cystic lesion in the right kidney. Not clearly a simple cyst by strict criteria. Please correlate with any prior or additional workup when clinically appropriate pre and postcontrast exam of CT or MRI when appropriate versus a six-month follow-up ultrasound Electronically Signed   By: Ranell Bring M.D.   On: 11/07/2023 11:23   Overnight EEG with video Result Date: 11/06/2023 Shelton Arlin KIDD, MD     11/06/2023  8:39 AM Patient Name: Javier Graham MRN: [id number] Epilepsy Attending: Arlin KIDD Shelton Referring Physician/Provider: Jerrie Lola LITTIE, MD Duration: 11/05/2023 2316 to 11/06/2023 0825 Patient history: 68 y.o. man with history of glioblastoma s/p resection, radiation, and temozolomide  admitted on 6/23 for evaluation of general decline in function and difficulty communicating with family. On Sunday 6/29 of jaw twitching around 11 AM that has continued despite a 3000 mg IV loading dose of Keppra; resolved after Ativan and additional 1500 mg Keppra. EEG to evaluate for seizure. Level of alertness: Awake,  asleep AEDs during EEG study: LEV Technical aspects: This EEG study was done with scalp electrodes positioned according to the 10-20 International system of electrode placement. Electrical activity was reviewed with band pass filter of 1-70Hz , sensitivity of 7 uV/mm, display speed of 77mm/sec with a 60Hz  notched filter applied as appropriate. EEG data were recorded continuously and digitally stored.  Video monitoring was available and reviewed as appropriate. Description: During awake state, no clear posterior dominant rhythm was seen.  Sleep was characterized by sleep spindles (12 to 14 Hz), maximal frontocentral region.  EEG showed continuous generalized and maximal right parieto-occipital region 3 to 6 Hz theta-delta slowing.  Sharp waves were also noted in right parieto-occipital region.  Hyperventilation and photic stimulation were not performed.   EEG was disconnected between 11/05/2023 2355 to 11/06/2023 0040 for MRI brain. ABNORMALITY -Sharp wave, right parieto-occipital region - Continuous slow, generalized and maximal right parieto-occipital region IMPRESSION: This study showed evidence of epileptogenicity and cortical dysfunction arising from right parieto-occipital region.  Additionally there is moderate to severe diffuse encephalopathy.  No definite seizures were noted. Arlin KIDD Shelton   MR BRAIN W WO CONTRAST Result Date: 11/06/2023 CLINICAL DATA:  Seizure, new-onset, no history of trauma EXAM: MRI HEAD WITHOUT AND WITH CONTRAST TECHNIQUE: Multiplanar, multiecho pulse sequences of the brain and surrounding structures were obtained without and with intravenous contrast. CONTRAST:  9mL GADAVIST  GADOBUTROL  1 MMOL/ML IV SOLN COMPARISON:  Noncontrast MRI head June 24, 25. Outside MRI with contrast from May 29 25. FINDINGS: Brain: Postoperative changes of right parietal craniotomy with subjacent resection  cavity, similar to recent MRI head and similar versus slightly decreased in size in comparison to May  29 25. In comparison to May postcontrast study, peripheral enhancement surrounding the cavity is mildly improved in there is also mild decrease in enhancement along the anterior aspect of the postop cavity. Persistent surrounding a mildly expansile T2/FLAIR hyperintensity. Additional site of mildly expansile T2 hyperintensity in the right frontal parenchyma and persistent enhancement in the inferior right frontal lobe (series 16, image 32). Similar enhancing 2 cm extra-axial dural-based mass along the planum sphenoidale, compatible with meningioma. No evidence of acute infarct, midline shift, or hydrocephalus. Similar cerebral atrophy and ex vacuo ventricular dilation. Vascular: Major arterial flow voids are maintained. Skull and upper cervical spine: Status post right posterior craniotomy. No other marrow signal abnormality. Sinuses/Orbits: Right maxillary sinus retention cyst. No acute orbital findings. Other: No mastoid effusions. IMPRESSION: 1. In comparison to recent MRI, similar resection cavity. In comparison to outside May 29 MRI with contrast, mildly decreased enhancement and mildly decreased expansile T2 hyperintensity surrounding the resection cavity and anterior to the resection cavity which likely represents residual tumor. 2. No substantial change in T2 hyperintense signal in the right frontal lobe with mild enhancement, potentially a second focus of glioma. 3. Stable planum sphenoidale meningioma. Electronically Signed   By: Gilmore GORMAN Molt M.D.   On: 11/06/2023 02:05   MR BRAIN WO CONTRAST Result Date: 10/31/2023 CLINICAL DATA:  Altered mental status. EXAM: MRI HEAD WITHOUT CONTRAST TECHNIQUE: Multiplanar, multiecho pulse sequences of the brain and surrounding structures were obtained without intravenous contrast. COMPARISON:  CT of the head dated October 30, 2023 and MRI brain dated Oct 05, 2023. FINDINGS: Brain: The patient is status post right posterior craniotomy for resection of lesion present  posterior medially within the right parietal lobe. There is a complex cystic surgical resection cavity again demonstrated measuring approximately 3.2 cm in AP diameter, 2.5 cm in transverse diameter and 3.3 cm in craniocaudad length. There is persistent residual elevated T2 signal within the surrounding white matter, which appears similar to the prior MRI. The current study was performed without the benefit of intravenous contrast. A focal area of increased T2 signal is also again demonstrated within the right frontal lobe, which appears unchanged. There is moderate generalized cerebral volume loss and mild to moderate periventricular white matter disease. A meningioma of the planum sphenoidale is also again demonstrated, measuring approximately 2.3 x 2.0 x 0.9 cm. Vascular: Normal vascular flow voids. Skull and upper cervical spine: Status post right posterior craniotomy. Normal marrow signal otherwise. Sinuses/Orbits: Opacification within the right maxillary sinus. The orbits are unremarkable. Other: None. IMPRESSION: 1. Status post right posterior craniotomy for resection of a lesion within the right posteromedial parietal lobe. There is no significant interval change in the appearance of the surgical resection cavity on this noncontrast MRI of the brain. 2. Stable lesion along the planum sphenoidale, compatible with meningioma. Electronically Signed   By: Evalene Coho M.D.   On: 10/31/2023 13:02   CT Head Wo Contrast Result Date: 10/30/2023 CLINICAL DATA:  Initial evaluation for acute delirium. EXAM: CT HEAD WITHOUT CONTRAST TECHNIQUE: Contiguous axial images were obtained from the base of the skull through the vertex without intravenous contrast. RADIATION DOSE REDUCTION: This exam was performed according to the departmental dose-optimization program which includes automated exposure control, adjustment of the mA and/or kV according to patient size and/or use of iterative reconstruction technique.  COMPARISON:  Prior MRI from 10/16/2023 FINDINGS: Brain: Postoperative changes from  prior right posterior craniotomy. Heterogeneous hypodensity with cystic change within the underlying right parieto-occipital region, consistent with known history of GBM. Irregular involvement of the anterior right frontal cortex, better appreciated on prior brain MRI. 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 No acute intracranial hemorrhage. No acute large vessel territory infarct. No other visible mass lesion. No significant mass effect or midline shift. Stable ventricular size without hydrocephalus. No extra-axial fluid collection. Vascular: No abnormal hyperdense vessel. Skull: Scalp soft tissues demonstrate no acute finding. Prior right posterior craniotomy. Calvarium otherwise intact. Sinuses/Orbits: Globes and orbital soft tissues within normal limits. Paranasal sinuses are largely clear. No significant mastoid effusion. Other: None. IMPRESSION: 1. No acute intracranial abnormality. 2. Postoperative changes from prior right posterior craniotomy with underlying right parieto-occipital GBM, better appreciated on prior brain MRI. No significant mass effect or midline shift. 3. 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 Electronically Signed   By: Morene Hoard M.D.   On: 10/30/2023 20:56   DG Chest Portable 1 View Result Date: 10/30/2023 CLINICAL DATA:  Altered mental status. EXAM: PORTABLE CHEST 1 VIEW COMPARISON:  None Available. FINDINGS: The heart size and mediastinal contours are within normal limits. Low lung volumes are noted. Both lungs are clear. The visualized skeletal structures are unremarkable. IMPRESSION: Low lung volumes without active cardiopulmonary disease. Electronically Signed   By: Suzen Dials M.D.   On: 10/30/2023 18:53    Microbiology: Recent Results (from the past 240 hours)  MRSA Next Gen by PCR, Nasal     Status: None    Collection Time: 11/05/23  9:16 PM   Specimen: Nasal Mucosa; Nasal Swab  Result Value Ref Range Status   MRSA by PCR Next Gen NOT DETECTED NOT DETECTED Final    Comment: (NOTE) The GeneXpert MRSA Assay (FDA approved for NASAL specimens only), is one component of a comprehensive MRSA colonization surveillance program. It is not intended to diagnose MRSA infection nor to guide or monitor treatment for MRSA infections. Test performance is not FDA approved in patients less than 61 years old. Performed at [facility], [address] [place], [place] [address]   Culture, blood (Routine X 2) w Reflex to ID Panel     Status: None (Preliminary result)   Collection Time: 11/07/23  9:13 AM   Specimen: BLOOD RIGHT ARM  Result Value Ref Range Status   Specimen Description BLOOD RIGHT ARM  Final   Special Requests   Final    BOTTLES DRAWN AEROBIC AND ANAEROBIC Blood Culture adequate volume   Culture   Final    NO GROWTH 2 DAYS Performed at [facility], [address] [place], [place] [address]    Report Status PENDING  Incomplete  Culture, blood (Routine X 2) w Reflex to ID Panel     Status: None (Preliminary result)   Collection Time: 11/07/23  9:13 AM   Specimen: BLOOD LEFT ARM  Result Value Ref Range Status   Specimen Description BLOOD LEFT ARM  Final   Special Requests   Final    BOTTLES DRAWN AEROBIC AND ANAEROBIC Blood Culture adequate volume   Culture   Final    NO GROWTH 2 DAYS Performed at [facility], [address], [place], [place] [address]    Report Status PENDING  Incomplete    Time spent: 35 minutes  Signed: Mignon ONEIDA Bump, MD Nov 10, 2023

## 2023-12-08 NOTE — Progress Notes (Signed)
 PROGRESS NOTE  Javier Graham FMW:[id number] DOB: 14-Dec-1955   PCP: Lari Elspeth BRAVO, MD  Patient is from: Home?  DOA: 10/30/2023 LOS: 9  Chief complaints Chief Complaint  Patient presents with   Altered Mental Status     Brief Narrative / Interim history: 68 yo male with GBM s/p resection in February as well as radiation and initiation of Temozolomide  who presented to [facility] on 6/23 after general decline and difficulty with communication. Pt was admitted and started on high dose steroids and Bevacizumab.  Pt was progressing for the duration of his stay and was being evaluated for SNF based on PT/OT recommendations. He was being preparing for discharge to SNF 6/30. However, thru the evening pt deteriorated mentally, unable able to verbalize and minimally opening eyes to sternal rub or verbal stimuli. Subsequently, pt had onset new onset of jaw twitch, progressing to RUE and RLE twitches as well. Pt was loaded with Keppra at present after virtual visit as well as 2mg  ativan. Neuro recommended transfer to ICU at [facility] for LTM and closer neuro monitoring.    Pt has been somnolent since presentation to [facility] neuro ICU. Protecting airway. He grimaces to painful stimuli, but does not remove noxious stimuli.   Per discussion with family, pt would not want intubation. Family is clear per report that he is to remain DNR/DNI based on his previously expressed wishes.    Significant Events Admitted 6/23 to [facility] with ams likely 2/2 GBM s/p resection 6/29 minimally responsive, transferred to [facility] ICU with concern for status epilepticus Transferred to [facility] for LTM and closer neuro monitoring 2/2 progressive encephalopathy Continues to be somnolent, MRI done: EEG done, sharps in parieto-occipital region. 7/1 transferred to hospitalist service  Transitioned to comfort measures on 7/1 after discussion with palliative  Subjective: Seen and examined earlier this morning.  No major events overnight or this morning.   Sleeping.  Slightly tachypneic.  Also tachycardic.  Objective: Vitals:   11/07/23 1419 November 27, 2023 0116 2023-11-27 0503 27-Nov-2023 0840  BP: 100/73 (!) 153/92 101/72 103/70  Pulse: (!) 123 71 (!) 37 (!) 140  Resp: (!) 21 17 19  (!) 38  Temp: 98.3 F (36.8 C) 98.7 F (37.1 C) 100.3 F (37.9 C) (!) 101.7 F (38.7 C)  TempSrc: Axillary Oral Oral Oral  SpO2: 95% 96% (!) 85% 90%  Weight:      Height:        Examination:  GENERAL: No apparent distress.  Sleeping. RESP: Tachypneic.  No IWOB.  Fair aeration bilaterally. CVS: Tachycardic to 120s.  Heart sounds normal.  ABD/GI/GU: BS+. Abd soft, NTND.  MSK/EXT:  No apparent deformity.  NEURO: Sleeping.  No apparent focal neuro deficit. PSYCH: No distress or agitation.  Consultants:  Neurology Palliative medicine  Procedures: None  Microbiology summarized: 7/1-blood culture NGTD  Assessment and plan: End-of-life care: Transitioned to full comfort care on 7/1. -Appreciate care by palliative medicine   SIRS: Not present at admission.  Febrile, tachycardic and tachypneic with leukocytosis and lactic acidosis.  No clear source of infection yet.  Blood cultures on 7/1 NGTD.  UA on admission basically negative.  Initial and repeat CXR without acute finding.  Now for comfort care.  Antibiotic discontinued.  Status Epilepticus Acute Metabolic Encephalopathy  Post Ictal State Glioblastoma s/p resection, radiation, temozolomide  -Appreciate neurology and neuro oncology assistance -Continue Keppra and Decadron   Acute respiratory failure with hypercapnia: VBG  7.08/80/<31/24.  Improved on repeat VBG.  On 6/28.   AKI: Unclear etiology.  Renal ultrasound without significant finding.  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   Essential hypertension  Hyperkalemia  Hyponatremia  Lactic acidosis  Body mass index is 26.39 kg/m.          DVT prophylaxis:  SCDs  Start: 10/30/23 2315  Code Status: DNR-comfort Family Communication: None at bedside Level of care: Palliative Care Status is: Inpatient Remains inpatient appropriate because: End-of-life care   Final disposition: Per palliative   35 minutes with more than 50% spent in reviewing records, counseling patient/family and coordinating care.   Sch Meds:  Scheduled Meds:  Chlorhexidine Gluconate Cloth  6 each Topical Daily   dexamethasone  (DECADRON ) injection  8 mg Intravenous Q8H   glycopyrrolate  0.2 mg Intravenous TID    HYDROmorphone (DILAUDID) injection  1 mg Intravenous Q4H   ketorolac  15 mg Intravenous Q8H   levETIRAcetam  750 mg Intravenous Q12H   pantoprazole (PROTONIX) IV  40 mg Intravenous Q24H   Continuous Infusions: PRN Meds:.acetaminophen  **OR** acetaminophen , antiseptic oral rinse, artificial tears, glycopyrrolate **OR** glycopyrrolate **OR** glycopyrrolate, haloperidol **OR** haloperidol **OR** haloperidol lactate, hydrALAZINE, HYDROmorphone (DILAUDID) injection, LORazepam, metoprolol tartrate, ondansetron  **OR** ondansetron  (ZOFRAN ) IV, mouth rinse  Antimicrobials: Anti-infectives (From admission, onward)    Start     Dose/Rate Route Frequency Ordered Stop   11/07/23 0920  vancomycin variable dose per unstable renal function (pharmacist dosing)  Status:  Discontinued         Does not apply See admin instructions 11/07/23 0920 11/07/23 1123   11/07/23 0845  vancomycin (VANCOREADY) IVPB 1750 mg/350 mL  Status:  Discontinued        1,750 mg 175 mL/hr over 120 Minutes Intravenous  Once 11/07/23 0757 11/07/23 1123   11/07/23 0830  piperacillin-tazobactam (ZOSYN) IVPB 3.375 g  Status:  Discontinued        3.375 g 12.5 mL/hr over 240 Minutes Intravenous Every 8 hours 11/07/23 0757 11/07/23 1123        I have personally reviewed the following labs and images: CBC: Recent Labs  Lab 11/04/23 0635 11/07/23 1041  WBC 13.8* 12.5*  NEUTROABS 11.8* 11.4*  HGB 17.0  15.6  HCT 48.8 45.1  MCV 89.2 91.5  PLT 319 189   BMP &GFR Recent Labs  Lab 11/04/23 0635 11/05/23 0519 11/05/23 1832 11/07/23 0617  NA 132* 129* 130* 137  K 5.0 4.5 4.8 5.2*  CL 94* 96* 101 104  CO2 25 18* 19* 18*  GLUCOSE 331* 336* 238* 325*  BUN 36* 37* 40* 56*  CREATININE 1.03 0.81 0.78 2.44*  CALCIUM 10.3 10.0 10.1 10.8*  MG 2.5* 2.5*  --   --    Estimated Creatinine Clearance: 33.2 mL/min (A) (by C-G formula based on SCr of 2.44 mg/dL (H)). Liver & Pancreas: Recent Labs  Lab 11/04/23 0635 11/05/23 0519 11/05/23 1832  AST 14* 12* 10*  ALT 25 24 22   ALKPHOS 69 64 66  BILITOT 1.8* 1.6* 1.4*  PROT 7.7 7.1 7.2  ALBUMIN 4.3 3.9 3.9   No results for input(s): LIPASE, AMYLASE in the last 168 hours. Recent Labs  Lab 11/04/23 0635  AMMONIA 20   Diabetic: No results for input(s): HGBA1C in the last 72 hours. Recent Labs  Lab 11/06/23 1607 11/06/23 2011 11/06/23 2304 11/07/23 0526 11/07/23 0932  GLUCAP 226* 290* 325* 323* 263*   Cardiac Enzymes: No results for input(s): CKTOTAL, CKMB, CKMBINDEX, TROPONINI in the last 168 hours. No results for  input(s): PROBNP in the last 8760 hours. Coagulation Profile: No results for input(s): INR, PROTIME in the last 168 hours. Thyroid Function Tests: No results for input(s): TSH, T4TOTAL, FREET4, T3FREE, THYROIDAB in the last 72 hours. Lipid Profile: No results for input(s): CHOL, HDL, LDLCALC, TRIG, CHOLHDL, LDLDIRECT in the last 72 hours. Anemia Panel: No results for input(s): VITAMINB12, FOLATE, FERRITIN, TIBC, IRON, RETICCTPCT in the last 72 hours. Urine analysis:    Component Value Date/Time   COLORURINE YELLOW 11/04/2023 0609   APPEARANCEUR CLEAR 11/04/2023 0609   LABSPEC 1.030 11/04/2023 0609   PHURINE 5.0 11/04/2023 0609   GLUCOSEU >=500 (A) 11/04/2023 0609   HGBUR NEGATIVE 11/04/2023 0609   BILIRUBINUR NEGATIVE 11/04/2023 0609   KETONESUR 5 (A)  11/04/2023 0609   PROTEINUR NEGATIVE 11/04/2023 0609   NITRITE NEGATIVE 11/04/2023 0609   LEUKOCYTESUR NEGATIVE 11/04/2023 0609   Sepsis Labs: Invalid input(s): PROCALCITONIN, LACTICIDVEN  Microbiology: Recent Results (from the past 240 hours)  MRSA Next Gen by PCR, Nasal     Status: None   Collection Time: 11/05/23  9:16 PM   Specimen: Nasal Mucosa; Nasal Swab  Result Value Ref Range Status   MRSA by PCR Next Gen NOT DETECTED NOT DETECTED Final    Comment: (NOTE) The GeneXpert MRSA Assay (FDA approved for NASAL specimens only), is one component of a comprehensive MRSA colonization surveillance program. It is not intended to diagnose MRSA infection nor to guide or monitor treatment for MRSA infections. Test performance is not FDA approved in patients less than 70 years old. Performed at [facility], [address] [place], [place] [address]   Culture, blood (Routine X 2) w Reflex to ID Panel     Status: None (Preliminary result)   Collection Time: 11/07/23  9:13 AM   Specimen: BLOOD RIGHT ARM  Result Value Ref Range Status   Specimen Description BLOOD RIGHT ARM  Final   Special Requests   Final    BOTTLES DRAWN AEROBIC AND ANAEROBIC Blood Culture adequate volume   Culture   Final    NO GROWTH < 24 HOURS Performed at [facility], [address], [place], [place] [address]    Report Status PENDING  Incomplete  Culture, blood (Routine X 2) w Reflex to ID Panel     Status: None (Preliminary result)   Collection Time: 11/07/23  9:13 AM   Specimen: BLOOD LEFT ARM  Result Value Ref Range Status   Specimen Description BLOOD LEFT ARM  Final   Special Requests   Final    BOTTLES DRAWN AEROBIC AND ANAEROBIC Blood Culture adequate volume   Culture   Final    NO GROWTH < 24 HOURS Performed at [facility], [address], [place], [place] [address]    Report Status PENDING  Incomplete    Radiology Studies: No results found.    December Hedtke T. Colyn Miron [facility]  Hospitalist  If 7PM-7AM, please contact night-coverage [email or web address] 2023/11/11, 2:35 PM

## 2023-12-08 NOTE — Progress Notes (Signed)
 Pt expired at 1923 today. MD, Family, [facility], and medical examiner Lucie Lemmings was notified. Pt is not a candidate to be examined by medical examiner. Lines and external catheter was removed before transport to the morgue. Patient placement was also notified that post mortem checklist is completed. Family did take all of patient belongings home with them.

## 2023-12-08 DEATH — deceased
